# Patient Record
Sex: Female | Born: 1945
Health system: Southern US, Community
[De-identification: ages and names within clinical notes are randomized; demographics above are authoritative.]

## PROBLEM LIST (undated history)

## (undated) DIAGNOSIS — M1990 Unspecified osteoarthritis, unspecified site: Secondary | ICD-10-CM

## (undated) DIAGNOSIS — C801 Malignant (primary) neoplasm, unspecified: Secondary | ICD-10-CM

## (undated) DIAGNOSIS — E039 Hypothyroidism, unspecified: Secondary | ICD-10-CM

## (undated) DIAGNOSIS — G4730 Sleep apnea, unspecified: Secondary | ICD-10-CM

## (undated) DIAGNOSIS — I2699 Other pulmonary embolism without acute cor pulmonale: Secondary | ICD-10-CM

## (undated) DIAGNOSIS — R0600 Dyspnea, unspecified: Secondary | ICD-10-CM

## (undated) DIAGNOSIS — I10 Essential (primary) hypertension: Secondary | ICD-10-CM

## (undated) DIAGNOSIS — Z889 Allergy status to unspecified drugs, medicaments and biological substances status: Secondary | ICD-10-CM

## (undated) HISTORY — PX: BREAST SURGERY: SHX581

## (undated) HISTORY — PX: TONSILLECTOMY: SUR1361

---

## 1998-03-29 ENCOUNTER — Ambulatory Visit (HOSPITAL_COMMUNITY): Admission: RE | Admit: 1998-03-29 | Discharge: 1998-03-29 | Payer: Self-pay | Admitting: Family Medicine

## 2003-12-28 ENCOUNTER — Ambulatory Visit: Admission: RE | Admit: 2003-12-28 | Discharge: 2004-02-28 | Payer: Self-pay | Admitting: Radiation Oncology

## 2004-01-19 ENCOUNTER — Encounter: Admission: RE | Admit: 2004-01-19 | Discharge: 2004-01-19 | Payer: Self-pay | Admitting: Surgery

## 2004-01-20 ENCOUNTER — Encounter (INDEPENDENT_AMBULATORY_CARE_PROVIDER_SITE_OTHER): Payer: Self-pay | Admitting: *Deleted

## 2004-01-20 ENCOUNTER — Ambulatory Visit (HOSPITAL_BASED_OUTPATIENT_CLINIC_OR_DEPARTMENT_OTHER): Admission: RE | Admit: 2004-01-20 | Discharge: 2004-01-20 | Payer: Self-pay | Admitting: Surgery

## 2004-01-20 ENCOUNTER — Ambulatory Visit (HOSPITAL_COMMUNITY): Admission: RE | Admit: 2004-01-20 | Discharge: 2004-01-20 | Payer: Self-pay | Admitting: Surgery

## 2004-03-09 ENCOUNTER — Ambulatory Visit: Admission: RE | Admit: 2004-03-09 | Discharge: 2004-03-09 | Payer: Self-pay | Admitting: Oncology

## 2004-03-09 ENCOUNTER — Encounter: Payer: Self-pay | Admitting: Cardiology

## 2004-03-10 ENCOUNTER — Encounter: Admission: RE | Admit: 2004-03-10 | Discharge: 2004-03-10 | Payer: Self-pay | Admitting: Oncology

## 2004-03-13 ENCOUNTER — Ambulatory Visit (HOSPITAL_BASED_OUTPATIENT_CLINIC_OR_DEPARTMENT_OTHER): Admission: RE | Admit: 2004-03-13 | Discharge: 2004-03-13 | Payer: Self-pay | Admitting: Surgery

## 2004-03-13 ENCOUNTER — Ambulatory Visit (HOSPITAL_COMMUNITY): Admission: RE | Admit: 2004-03-13 | Discharge: 2004-03-13 | Payer: Self-pay | Admitting: Surgery

## 2004-03-21 ENCOUNTER — Ambulatory Visit (HOSPITAL_COMMUNITY): Admission: RE | Admit: 2004-03-21 | Discharge: 2004-03-21 | Payer: Self-pay | Admitting: Surgery

## 2004-07-01 IMAGING — CT CT CHEST W/ CM
1 of 2 series · 15 of 32 positions shown, 19 images · IV contrast (omnipaque)
Comparison: None.
COMPARISON: None.

CLINICAL DATA: Breast cancer.  
 CT CHEST WITH CONTRAST, CT HEAD WITH AND WITHOUT CONTRAST 03/09/04 AT 3933 HOURS
TECHNIQUE: CT head without contrast was performed.  Subsequently after the intravenous injection of 100 cc Omnipaque 300, 5 mm spiral images were obtained through the chest.  
 CT CHEST

[Series 2: chest routine 5.0 b10f · axial · 0.62mm/px · z∈[+6,+276]mm · 15 of 64 slices shown, 19 images]
[im 5/64  mediastinal]
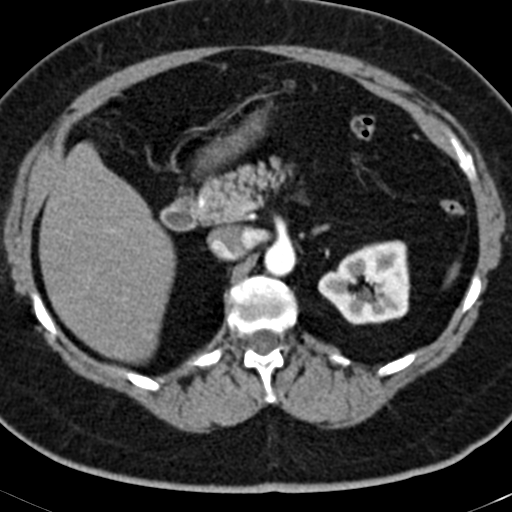
[im 5/64  lung]
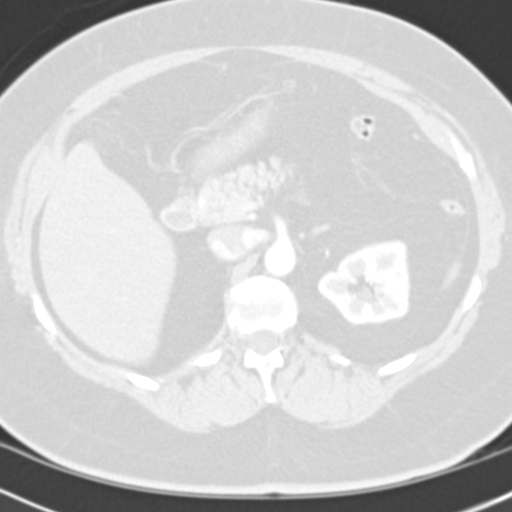
[im 9/64  lung]
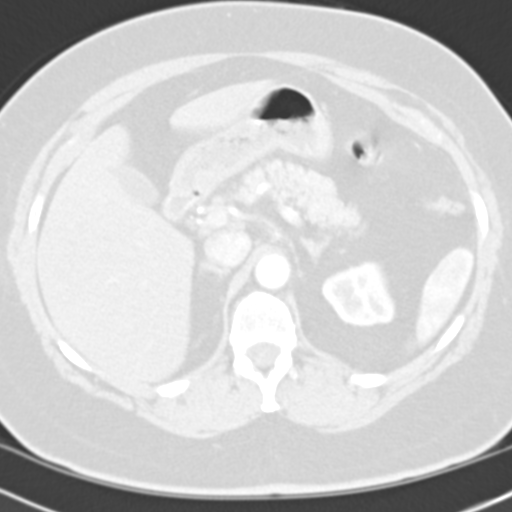
[im 13/64  lung]
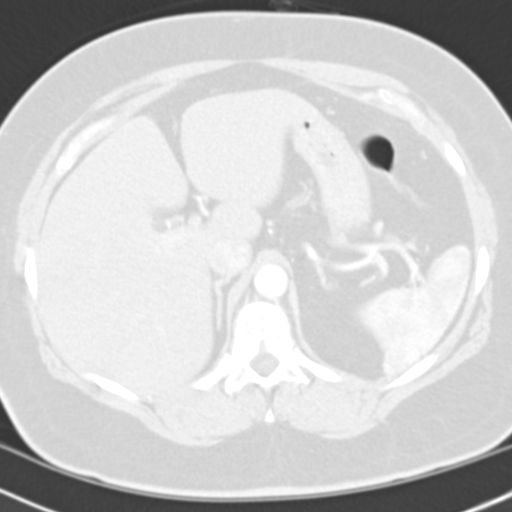
[im 17/64  lung]
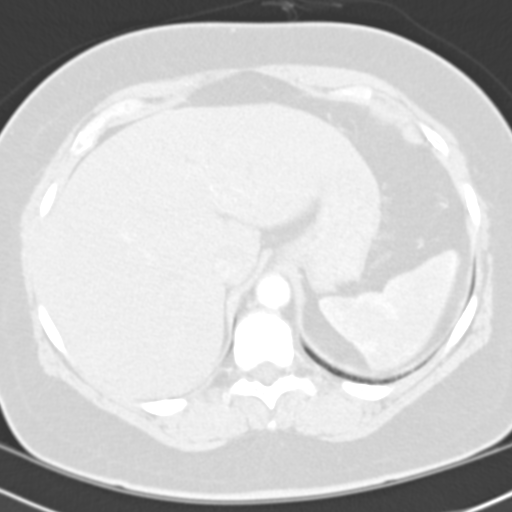
[im 22/64  mediastinal]
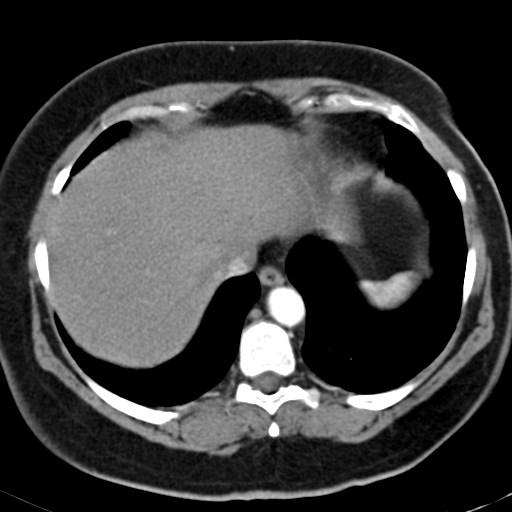
[im 22/64  lung]
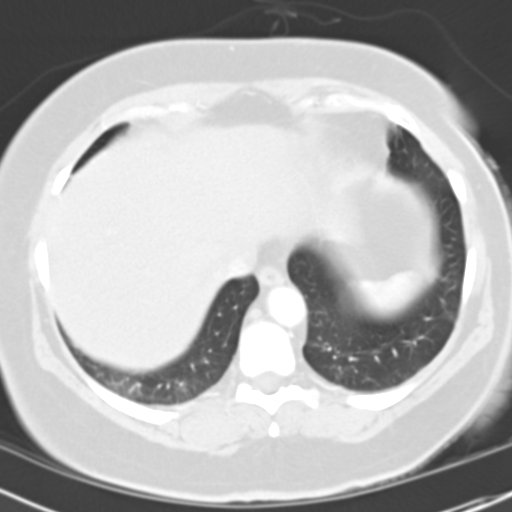
[im 26/64  lung]
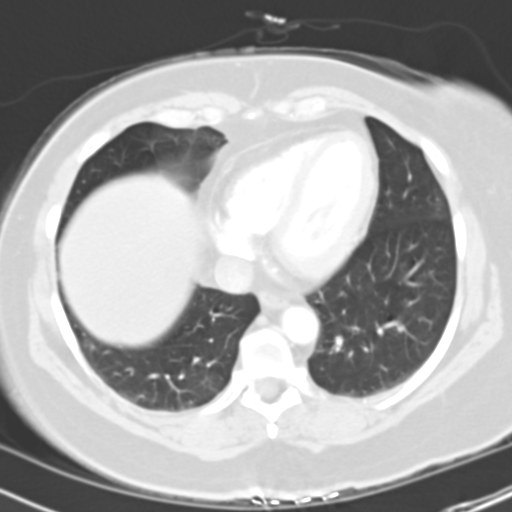
[im 30/64  lung]
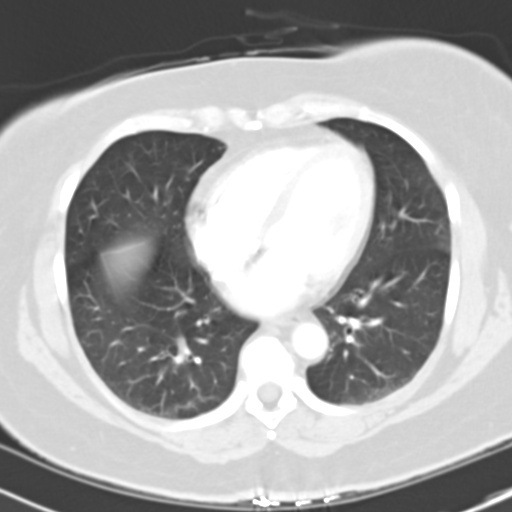
[im 32/64  lung]
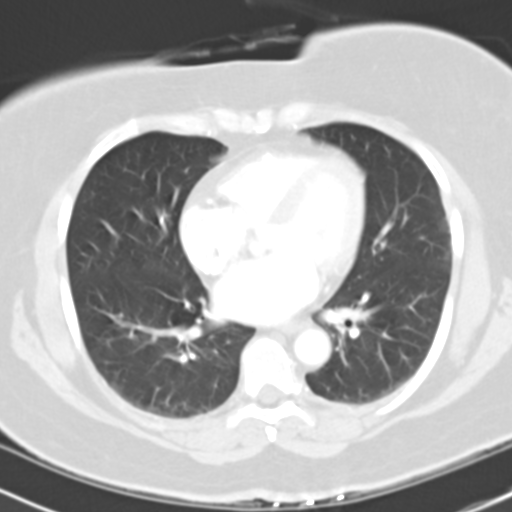
[im 34/64  mediastinal]
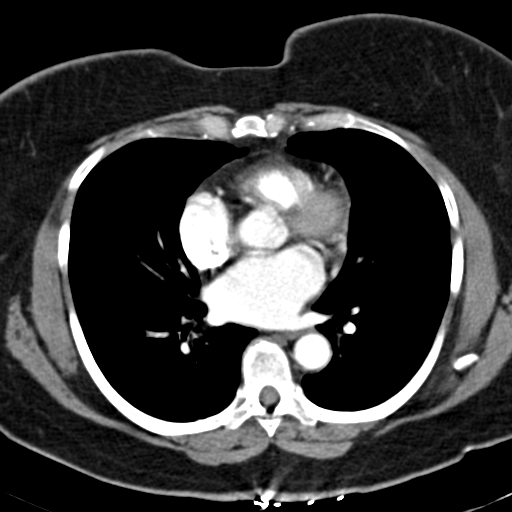
[im 34/64  lung]
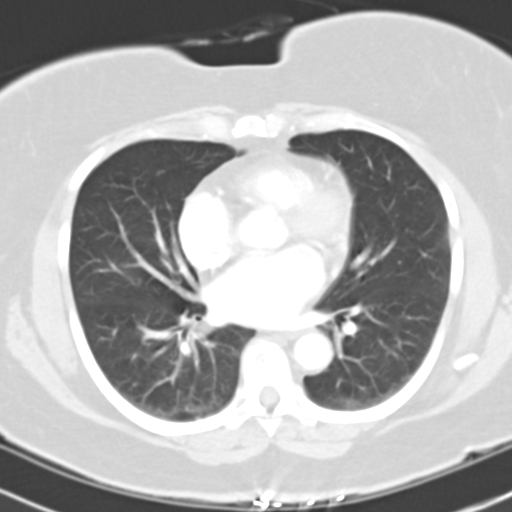
[im 38/64  lung]
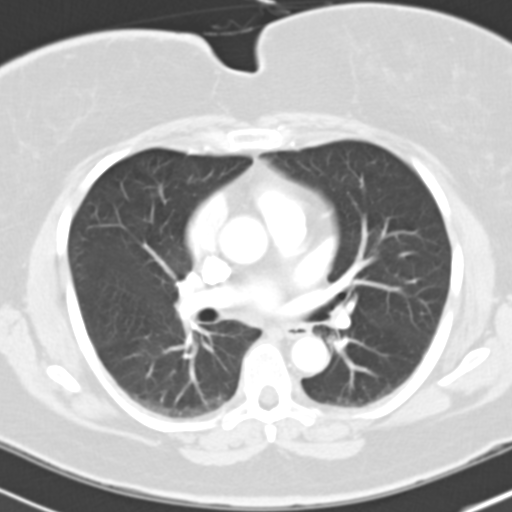
[im 43/64  lung]
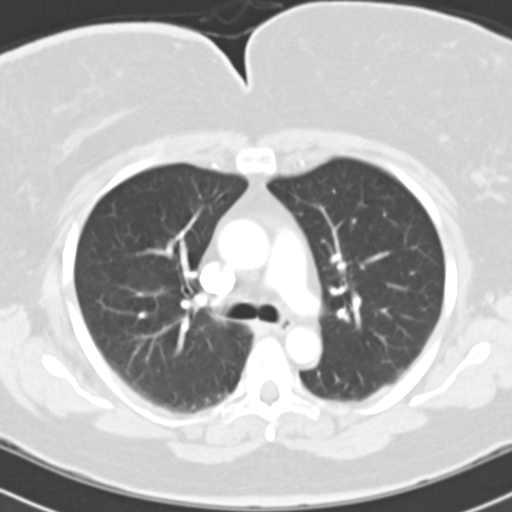
[im 47/64  lung]
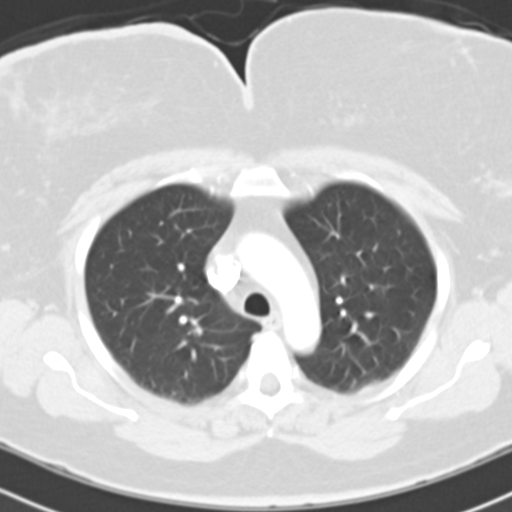
[im 51/64  mediastinal]
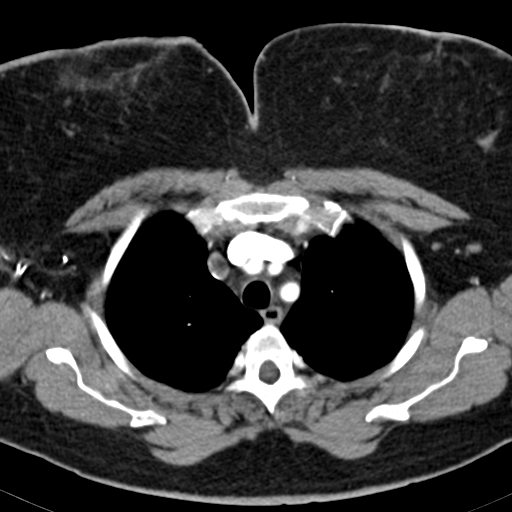
[im 51/64  lung]
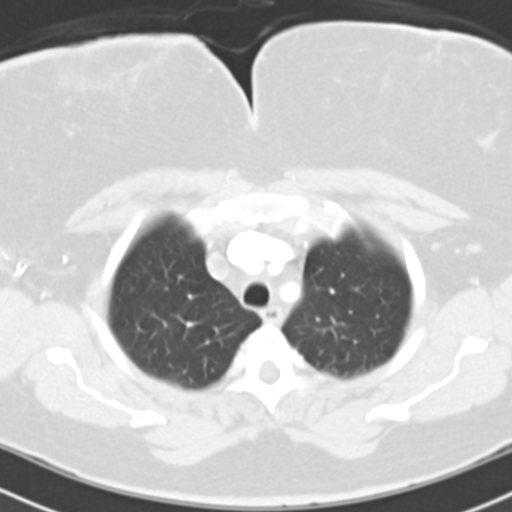
[im 55/64  lung]
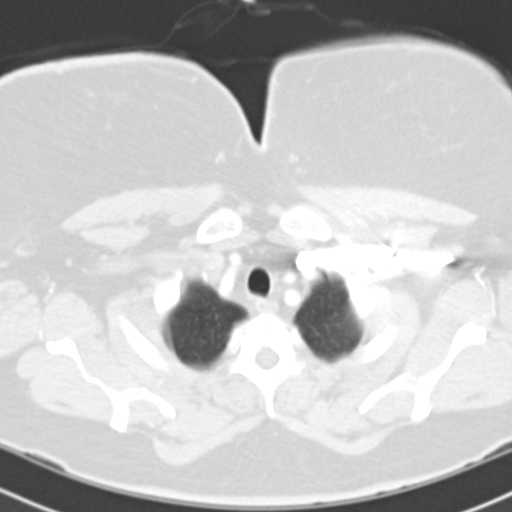
[im 59/64  lung]
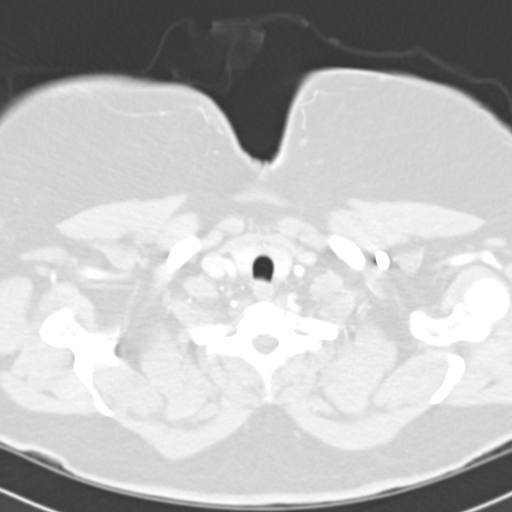

[15 of 32 positions shown; findings below may reference images not displayed]

FINDINGS: Postsurgical changes are noted in the right breast.  There is no evidence of abnormal mediastinal adenopathy.  
 No pneumothoraces or effusions are seen.  
 A 5 mm nodule is seen in the right middle lobe on image 29.  
 IMPRESSION
 Postsurgical changes in the right breast. 
 5 mm right middle lobe nodule.  
 CT HEAD WITH AND WITHOUT CONTRAST
FINDINGS: The brain parenchyma, ventricular system and extraaxial space are within normal limits.  There is no evidence of mass effect, midline shift, acute hemorrhage or abnormal enhancement.  Mucosal thickening is seen in the sphenoid and ethmoid sinuses.  The mastoid air cells are clear.  The cranium is intact.  
 IMPRESSION
 No evidence of metastatic disease.  Mucosal thickening in the paranasal sinuses is noted.

## 2004-07-24 ENCOUNTER — Ambulatory Visit: Payer: Self-pay | Admitting: Oncology

## 2004-09-07 ENCOUNTER — Ambulatory Visit (HOSPITAL_BASED_OUTPATIENT_CLINIC_OR_DEPARTMENT_OTHER): Admission: RE | Admit: 2004-09-07 | Discharge: 2004-09-07 | Payer: Self-pay | Admitting: Surgery

## 2004-09-29 ENCOUNTER — Ambulatory Visit: Admission: RE | Admit: 2004-09-29 | Discharge: 2004-12-20 | Payer: Self-pay | Admitting: Radiation Oncology

## 2004-10-13 ENCOUNTER — Ambulatory Visit (HOSPITAL_COMMUNITY): Admission: RE | Admit: 2004-10-13 | Discharge: 2004-10-13 | Payer: Self-pay | Admitting: Radiation Oncology

## 2004-11-14 ENCOUNTER — Ambulatory Visit: Payer: Self-pay | Admitting: Oncology

## 2005-01-11 ENCOUNTER — Ambulatory Visit: Admission: RE | Admit: 2005-01-11 | Discharge: 2005-01-11 | Payer: Self-pay | Admitting: Radiation Oncology

## 2005-02-05 ENCOUNTER — Ambulatory Visit: Payer: Self-pay | Admitting: Oncology

## 2005-05-07 ENCOUNTER — Ambulatory Visit: Payer: Self-pay | Admitting: Oncology

## 2005-08-29 ENCOUNTER — Other Ambulatory Visit: Admission: RE | Admit: 2005-08-29 | Discharge: 2005-08-29 | Payer: Self-pay | Admitting: Family Medicine

## 2005-08-31 ENCOUNTER — Ambulatory Visit: Payer: Self-pay | Admitting: Oncology

## 2005-10-30 ENCOUNTER — Ambulatory Visit (HOSPITAL_COMMUNITY): Admission: RE | Admit: 2005-10-30 | Discharge: 2005-10-30 | Payer: Self-pay | Admitting: Oncology

## 2005-11-02 ENCOUNTER — Ambulatory Visit: Payer: Self-pay | Admitting: Oncology

## 2005-12-25 ENCOUNTER — Ambulatory Visit: Payer: Self-pay | Admitting: Oncology

## 2005-12-26 LAB — CBC WITH DIFFERENTIAL/PLATELET
BASO%: 0.7 % (ref 0.0–2.0)
EOS%: 1.3 % (ref 0.0–7.0)
LYMPH%: 40.3 % (ref 14.0–48.0)
MCHC: 34.2 g/dL (ref 32.0–36.0)
MCV: 89.6 fL (ref 81.0–101.0)
MONO%: 8.4 % (ref 0.0–13.0)
Platelets: 387 10*3/uL (ref 145–400)
RBC: 4.21 10*6/uL (ref 3.70–5.32)

## 2005-12-26 LAB — COMPREHENSIVE METABOLIC PANEL
ALT: 20 U/L (ref 0–40)
AST: 20 U/L (ref 0–37)
Alkaline Phosphatase: 99 U/L (ref 39–117)
Glucose, Bld: 91 mg/dL (ref 70–99)
Sodium: 139 mEq/L (ref 135–145)
Total Bilirubin: 0.5 mg/dL (ref 0.3–1.2)
Total Protein: 7.1 g/dL (ref 6.0–8.3)

## 2005-12-28 ENCOUNTER — Ambulatory Visit (HOSPITAL_COMMUNITY): Admission: RE | Admit: 2005-12-28 | Discharge: 2005-12-28 | Payer: Self-pay | Admitting: Oncology

## 2006-06-18 ENCOUNTER — Ambulatory Visit: Payer: Self-pay | Admitting: Oncology

## 2006-06-20 LAB — COMPREHENSIVE METABOLIC PANEL
AST: 18 U/L (ref 0–37)
Albumin: 4.3 g/dL (ref 3.5–5.2)
Alkaline Phosphatase: 95 U/L (ref 39–117)
Chloride: 105 mEq/L (ref 96–112)
Glucose, Bld: 93 mg/dL (ref 70–99)
Potassium: 4 mEq/L (ref 3.5–5.3)
Sodium: 141 mEq/L (ref 135–145)
Total Protein: 7 g/dL (ref 6.0–8.3)

## 2006-06-20 LAB — CBC WITH DIFFERENTIAL/PLATELET
BASO%: 0.5 % (ref 0.0–2.0)
Eosinophils Absolute: 0.1 10*3/uL (ref 0.0–0.5)
LYMPH%: 45.9 % (ref 14.0–48.0)
MCHC: 34.5 g/dL (ref 32.0–36.0)
MONO#: 0.6 10*3/uL (ref 0.1–0.9)
NEUT#: 3.2 10*3/uL (ref 1.5–6.5)
Platelets: 372 10*3/uL (ref 145–400)
RBC: 4.22 10*6/uL (ref 3.70–5.32)
RDW: 14.9 % — ABNORMAL HIGH (ref 11.3–14.5)
WBC: 7.3 10*3/uL (ref 3.9–10.0)
lymph#: 3.4 10*3/uL — ABNORMAL HIGH (ref 0.9–3.3)

## 2006-12-16 ENCOUNTER — Ambulatory Visit: Payer: Self-pay | Admitting: Oncology

## 2006-12-18 LAB — CBC WITH DIFFERENTIAL/PLATELET
BASO%: 0.5 % (ref 0.0–2.0)
Eosinophils Absolute: 0.1 10*3/uL (ref 0.0–0.5)
MCHC: 35.3 g/dL (ref 32.0–36.0)
MONO#: 0.7 10*3/uL (ref 0.1–0.9)
NEUT#: 3.6 10*3/uL (ref 1.5–6.5)
Platelets: 386 10*3/uL (ref 145–400)
RBC: 4.31 10*6/uL (ref 3.70–5.32)
WBC: 7.9 10*3/uL (ref 3.9–10.0)
lymph#: 3.5 10*3/uL — ABNORMAL HIGH (ref 0.9–3.3)

## 2006-12-18 LAB — COMPREHENSIVE METABOLIC PANEL
ALT: 19 U/L (ref 0–35)
Albumin: 4.5 g/dL (ref 3.5–5.2)
CO2: 25 mEq/L (ref 19–32)
Calcium: 10.2 mg/dL (ref 8.4–10.5)
Chloride: 106 mEq/L (ref 96–112)
Glucose, Bld: 87 mg/dL (ref 70–99)
Potassium: 4 mEq/L (ref 3.5–5.3)
Sodium: 141 mEq/L (ref 135–145)
Total Bilirubin: 0.5 mg/dL (ref 0.3–1.2)
Total Protein: 7.4 g/dL (ref 6.0–8.3)

## 2006-12-18 LAB — CANCER ANTIGEN 27.29: CA 27.29: 18 U/mL (ref 0–39)

## 2007-06-20 ENCOUNTER — Ambulatory Visit: Payer: Self-pay | Admitting: Oncology

## 2007-06-25 LAB — COMPREHENSIVE METABOLIC PANEL
ALT: 18 U/L (ref 0–35)
AST: 21 U/L (ref 0–37)
Calcium: 9.9 mg/dL (ref 8.4–10.5)
Chloride: 105 mEq/L (ref 96–112)
Creatinine, Ser: 0.83 mg/dL (ref 0.40–1.20)
Sodium: 142 mEq/L (ref 135–145)
Total Bilirubin: 0.4 mg/dL (ref 0.3–1.2)
Total Protein: 7.1 g/dL (ref 6.0–8.3)

## 2007-06-25 LAB — CBC WITH DIFFERENTIAL/PLATELET
BASO%: 0.9 % (ref 0.0–2.0)
EOS%: 1 % (ref 0.0–7.0)
HCT: 38.4 % (ref 34.8–46.6)
MCH: 31.4 pg (ref 26.0–34.0)
MCHC: 35.1 g/dL (ref 32.0–36.0)
NEUT%: 48.9 % (ref 39.6–76.8)
RBC: 4.3 10*6/uL (ref 3.70–5.32)
WBC: 7.9 10*3/uL (ref 3.9–10.0)
lymph#: 3.3 10*3/uL (ref 0.9–3.3)

## 2007-09-09 ENCOUNTER — Other Ambulatory Visit: Admission: RE | Admit: 2007-09-09 | Discharge: 2007-09-09 | Payer: Self-pay | Admitting: Family Medicine

## 2007-12-22 ENCOUNTER — Ambulatory Visit: Payer: Self-pay | Admitting: Oncology

## 2007-12-24 LAB — COMPREHENSIVE METABOLIC PANEL
AST: 22 U/L (ref 0–37)
Albumin: 4.4 g/dL (ref 3.5–5.2)
BUN: 16 mg/dL (ref 6–23)
Calcium: 9.7 mg/dL (ref 8.4–10.5)
Chloride: 106 mEq/L (ref 96–112)
Creatinine, Ser: 0.9 mg/dL (ref 0.40–1.20)
Glucose, Bld: 121 mg/dL — ABNORMAL HIGH (ref 70–99)
Potassium: 4.3 mEq/L (ref 3.5–5.3)

## 2007-12-24 LAB — CBC WITH DIFFERENTIAL/PLATELET
Basophils Absolute: 0 10*3/uL (ref 0.0–0.1)
EOS%: 2.7 % (ref 0.0–7.0)
Eosinophils Absolute: 0.2 10*3/uL (ref 0.0–0.5)
HCT: 36.5 % (ref 34.8–46.6)
HGB: 12.7 g/dL (ref 11.6–15.9)
MCH: 31.1 pg (ref 26.0–34.0)
MCV: 89.2 fL (ref 81.0–101.0)
MONO%: 6.6 % (ref 0.0–13.0)
NEUT#: 3.7 10*3/uL (ref 1.5–6.5)
NEUT%: 50.1 % (ref 39.6–76.8)
lymph#: 3 10*3/uL (ref 0.9–3.3)

## 2007-12-24 LAB — CANCER ANTIGEN 27.29: CA 27.29: 15 U/mL (ref 0–39)

## 2008-01-05 LAB — ESTRADIOL, ULTRA SENS: Estradiol, Ultra Sensitive: 2 pg/mL

## 2008-07-02 ENCOUNTER — Ambulatory Visit: Payer: Self-pay | Admitting: Oncology

## 2008-07-06 LAB — CBC WITH DIFFERENTIAL/PLATELET
Basophils Absolute: 0 10*3/uL (ref 0.0–0.1)
HCT: 38.2 % (ref 34.8–46.6)
HGB: 13.2 g/dL (ref 11.6–15.9)
MONO#: 0.6 10*3/uL (ref 0.1–0.9)
NEUT#: 3.4 10*3/uL (ref 1.5–6.5)
NEUT%: 45.4 % (ref 39.6–76.8)
RDW: 14.9 % — ABNORMAL HIGH (ref 11.3–14.5)
WBC: 7.6 10*3/uL (ref 3.9–10.0)
lymph#: 3.2 10*3/uL (ref 0.9–3.3)

## 2008-07-07 LAB — LIPID PANEL
Cholesterol: 190 mg/dL (ref 0–200)
HDL: 44 mg/dL (ref >39)
Total CHOL/HDL Ratio: 4.3 Ratio
Triglycerides: 312 mg/dL — ABNORMAL HIGH (ref <150)
VLDL: 62 mg/dL — ABNORMAL HIGH (ref 0–40)

## 2008-07-07 LAB — COMPREHENSIVE METABOLIC PANEL
AST: 26 U/L (ref 0–37)
Albumin: 4.6 g/dL (ref 3.5–5.2)
Alkaline Phosphatase: 82 U/L (ref 39–117)
BUN: 16 mg/dL (ref 6–23)
Creatinine, Ser: 0.83 mg/dL (ref 0.40–1.20)
Potassium: 4.3 mEq/L (ref 3.5–5.3)
Total Bilirubin: 0.6 mg/dL (ref 0.3–1.2)

## 2009-07-01 ENCOUNTER — Ambulatory Visit: Payer: Self-pay | Admitting: Oncology

## 2009-07-05 LAB — CBC WITH DIFFERENTIAL/PLATELET
Basophils Absolute: 0.1 10*3/uL (ref 0.0–0.1)
EOS%: 2.7 % (ref 0.0–7.0)
Eosinophils Absolute: 0.2 10*3/uL (ref 0.0–0.5)
HGB: 13 g/dL (ref 11.6–15.9)
MCH: 31.4 pg (ref 25.1–34.0)
NEUT#: 3.6 10*3/uL (ref 1.5–6.5)
RDW: 14.4 % (ref 11.2–14.5)
WBC: 8.5 10*3/uL (ref 3.9–10.3)
lymph#: 4 10*3/uL — ABNORMAL HIGH (ref 0.9–3.3)

## 2009-07-05 LAB — COMPREHENSIVE METABOLIC PANEL
AST: 26 U/L (ref 0–37)
Albumin: 4.2 g/dL (ref 3.5–5.2)
BUN: 19 mg/dL (ref 6–23)
Calcium: 9.3 mg/dL (ref 8.4–10.5)
Chloride: 104 mEq/L (ref 96–112)
Potassium: 3.6 mEq/L (ref 3.5–5.3)

## 2010-01-05 ENCOUNTER — Ambulatory Visit: Payer: Self-pay | Admitting: Oncology

## 2010-01-09 LAB — CBC WITH DIFFERENTIAL/PLATELET
BASO%: 0.6 % (ref 0.0–2.0)
Eosinophils Absolute: 0.2 10*3/uL (ref 0.0–0.5)
HCT: 38.4 % (ref 34.8–46.6)
MCHC: 33.8 g/dL (ref 31.5–36.0)
MONO#: 0.5 10*3/uL (ref 0.1–0.9)
NEUT#: 3.3 10*3/uL (ref 1.5–6.5)
NEUT%: 44.8 % (ref 38.4–76.8)
RBC: 4.16 10*6/uL (ref 3.70–5.45)
WBC: 7.3 10*3/uL (ref 3.9–10.3)
lymph#: 3.3 10*3/uL (ref 0.9–3.3)

## 2010-01-09 LAB — COMPREHENSIVE METABOLIC PANEL
ALT: 24 U/L (ref 0–35)
Albumin: 4.1 g/dL (ref 3.5–5.2)
CO2: 27 mEq/L (ref 19–32)
Calcium: 10 mg/dL (ref 8.4–10.5)
Chloride: 105 mEq/L (ref 96–112)
Sodium: 140 mEq/L (ref 135–145)
Total Protein: 7.1 g/dL (ref 6.0–8.3)

## 2010-01-09 LAB — CANCER ANTIGEN 27.29: CA 27.29: 17 U/mL (ref 0–39)

## 2010-01-09 LAB — LIPID PANEL
LDL Cholesterol: 100 mg/dL — ABNORMAL HIGH (ref 0–99)
VLDL: 41 mg/dL — ABNORMAL HIGH (ref 0–40)

## 2010-10-15 ENCOUNTER — Encounter: Payer: Self-pay | Admitting: Oncology

## 2011-02-09 NOTE — Op Note (Signed)
NAMEJOHNATHON, MITTAL              ACCOUNT NO.:  [id number]   MEDICAL RECORD NO.:  [id number]          PATIENT TYPE:  AMB   LOCATION:  [facility]                          FACILITY:  [facility]   PHYSICIAN:  Currie Paris, M.D.DATE OF BIRTH:  August 02, 1946   DATE OF PROCEDURE:  09/07/2004  DATE OF DISCHARGE:                                 OPERATIVE REPORT   PREOPERATIVE DIAGNOSIS:  Unneeded Port-A-Cath.   POSTOPERATIVE DIAGNOSIS:  Unneeded Port-A-Cath.   OPERATION:  Removal of Port-A-Cath.   SURGEON:  Currie Paris, M.D.   ANESTHESIA:  Local.   CLINICAL HISTORY:  This patient has completed her chemotherapy and is  getting ready to start radiation and wished to have her Port-A-Cath removed.   DESCRIPTION OF PROCEDURE:  In the minor procedure room, the Port-A-Cath area  was identified, prepped with alcohol, and anesthetized with about 10 mL of  1% Xylocaine with epinephrine.  After about a 10-minute wait, the area was  prepped with Betadine.  The old scar was opened and a Port-A-Cath readily  identified.  The two holding Prolene sutures were cut and the Port-A-Cath  removed from the pocket and the tubing backed up a little ways.   I put a 3-0 Vicryl figure-of-eight through around the Port-A-Cath tubing  site so that the tract would be occluded when the catheter backed out and  then removed the catheter intact.  The suture was tied down.  There was no  back bleeding.   The incision was closed with some 3-0 Vicryl and 4-0 Monocryl subcuticular  and Dermabond.  The patient tolerated the procedure well with no  complications noted.      Chri   CJS/MEDQ  D:  09/07/2004  T:  09/07/2004  Job:  [id number]

## 2011-03-01 ENCOUNTER — Other Ambulatory Visit: Payer: Self-pay | Admitting: Family Medicine

## 2011-03-01 ENCOUNTER — Other Ambulatory Visit (HOSPITAL_COMMUNITY)
Admission: RE | Admit: 2011-03-01 | Discharge: 2011-03-01 | Disposition: A | Discharge status: Home / Self Care | Payer: Self-pay | Source: Ambulatory Visit | Attending: Family Medicine | Admitting: Family Medicine

## 2011-03-01 DIAGNOSIS — Z01419 Encounter for gynecological examination (general) (routine) without abnormal findings: Secondary | ICD-10-CM | POA: Insufficient documentation

## 2013-05-28 ENCOUNTER — Encounter: Payer: Self-pay | Admitting: Oncology

## 2013-12-17 ENCOUNTER — Other Ambulatory Visit: Payer: Self-pay | Admitting: Family Medicine

## 2013-12-17 ENCOUNTER — Other Ambulatory Visit (HOSPITAL_COMMUNITY)
Admission: RE | Admit: 2013-12-17 | Discharge: 2013-12-17 | Disposition: A | Discharge status: Home / Self Care | Payer: Medicare PPO | Source: Ambulatory Visit | Attending: Family Medicine | Admitting: Family Medicine

## 2013-12-17 DIAGNOSIS — Z124 Encounter for screening for malignant neoplasm of cervix: Secondary | ICD-10-CM | POA: Insufficient documentation

## 2014-02-16 ENCOUNTER — Other Ambulatory Visit: Payer: Self-pay | Admitting: Gastroenterology

## 2014-12-16 DIAGNOSIS — E6609 Other obesity due to excess calories: Secondary | ICD-10-CM | POA: Diagnosis not present

## 2014-12-16 DIAGNOSIS — Z6839 Body mass index (BMI) 39.0-39.9, adult: Secondary | ICD-10-CM | POA: Diagnosis not present

## 2014-12-16 DIAGNOSIS — E785 Hyperlipidemia, unspecified: Secondary | ICD-10-CM | POA: Diagnosis not present

## 2014-12-16 DIAGNOSIS — I10 Essential (primary) hypertension: Secondary | ICD-10-CM | POA: Diagnosis not present

## 2014-12-16 DIAGNOSIS — E039 Hypothyroidism, unspecified: Secondary | ICD-10-CM | POA: Diagnosis not present

## 2015-05-10 DIAGNOSIS — Z853 Personal history of malignant neoplasm of breast: Secondary | ICD-10-CM | POA: Diagnosis not present

## 2015-05-10 DIAGNOSIS — Z1231 Encounter for screening mammogram for malignant neoplasm of breast: Secondary | ICD-10-CM | POA: Diagnosis not present

## 2015-06-02 DIAGNOSIS — Z23 Encounter for immunization: Secondary | ICD-10-CM | POA: Diagnosis not present

## 2015-06-02 DIAGNOSIS — E039 Hypothyroidism, unspecified: Secondary | ICD-10-CM | POA: Diagnosis not present

## 2015-06-02 DIAGNOSIS — I10 Essential (primary) hypertension: Secondary | ICD-10-CM | POA: Diagnosis not present

## 2015-06-02 DIAGNOSIS — E785 Hyperlipidemia, unspecified: Secondary | ICD-10-CM | POA: Diagnosis not present

## 2015-06-02 DIAGNOSIS — Z0000 Encounter for general adult medical examination without abnormal findings: Secondary | ICD-10-CM | POA: Diagnosis not present

## 2015-06-02 DIAGNOSIS — M8580 Other specified disorders of bone density and structure, unspecified site: Secondary | ICD-10-CM | POA: Diagnosis not present

## 2015-09-27 DIAGNOSIS — I10 Essential (primary) hypertension: Secondary | ICD-10-CM | POA: Diagnosis not present

## 2015-09-27 DIAGNOSIS — N898 Other specified noninflammatory disorders of vagina: Secondary | ICD-10-CM | POA: Diagnosis not present

## 2015-09-29 ENCOUNTER — Other Ambulatory Visit: Payer: Self-pay | Admitting: Family Medicine

## 2015-09-29 DIAGNOSIS — N898 Other specified noninflammatory disorders of vagina: Secondary | ICD-10-CM

## 2015-10-03 ENCOUNTER — Inpatient Hospital Stay
Admission: RE | Admit: 2015-10-03 | Discharge: 2015-10-03 | Disposition: A | Discharge status: Home / Self Care | Payer: Medicare PPO | Source: Ambulatory Visit | Attending: Family Medicine | Admitting: Family Medicine

## 2015-10-06 ENCOUNTER — Ambulatory Visit
Admission: RE | Admit: 2015-10-06 | Discharge: 2015-10-06 | Disposition: A | Discharge status: Home / Self Care | Payer: Medicare PPO | Source: Ambulatory Visit | Attending: Family Medicine | Admitting: Family Medicine

## 2015-10-06 DIAGNOSIS — N898 Other specified noninflammatory disorders of vagina: Secondary | ICD-10-CM | POA: Diagnosis not present

## 2015-10-06 MED ORDER — IOPAMIDOL (ISOVUE-300) INJECTION 61%
100.0000 mL | Freq: Once | INTRAVENOUS | Status: AC | PRN
  Administered 2015-10-06: 100 mL via INTRAVENOUS

## 2015-10-17 ENCOUNTER — Other Ambulatory Visit: Payer: Self-pay | Admitting: General Surgery

## 2015-10-17 DIAGNOSIS — N824 Other female intestinal-genital tract fistulae: Secondary | ICD-10-CM | POA: Diagnosis not present

## 2015-10-17 NOTE — H&P (Signed)
History of Present Illness Cassandra Ruff MD; [id number] 12:11 PM) Patient words: New-.  The patient is a 70 year old female who presents with diverticulitis. This 70 year old female who presents to the office with vaginal drainage since November. She states this occurs after bowel movements. She underwent a CT scan which showed diverticulitis in the area near her vagina on the left side. She denies any surgical history. Her last colonoscopy was less than 2 years ago and normal per patient's recollection. She denies any rectal bleeding. She denies any pain.   Other Problems Malachi Bonds, CMA; 10/17/2015 8:47 AM) Breast Cancer High blood pressure Hypercholesterolemia Thyroid Disease  Past Surgical History Malachi Bonds, CMA; 10/17/2015 8:47 AM) Breast Biopsy Right. Breast Mass; Local Excision Right. Sentinel Lymph Node Biopsy Tonsillectomy  Diagnostic Studies History Malachi Bonds, CMA; 10/17/2015 8:47 AM) Colonoscopy within last year Mammogram within last year Pap Smear 1-5 years ago  Allergies Malachi Bonds, CMA; 10/17/2015 8:48 AM) No Known Drug Allergies 10/17/2015  Medication History Malachi Bonds, CMA; 10/17/2015 8:49 AM) MetroNIDAZOLE (500MG  Tablet, Oral) Active. Sulfamethoxazole-Trimethoprim (800-160MG  Tablet, Oral) Active. Levothyroxine Sodium (100MCG Tablet, Oral) Active. Atorvastatin Calcium (80MG  Tablet, Oral) Active. Bisoprolol-Hydrochlorothiazide (10-6.25MG  Tablet, Oral) Active. Benazepril HCl (20MG  Tablet, Oral) Active. Medications Reconciled  Social History Malachi Bonds, CMA; 10/17/2015 8:47 AM) Alcohol use Occasional alcohol use. Caffeine use Carbonated beverages, Coffee, Tea. No drug use Tobacco use Never smoker.  Family History Malachi Bonds, CMA; 10/17/2015 8:47 AM) Arthritis Mother. Cerebrovascular Accident Brother. Heart Disease Brother. Hypertension Mother. Migraine Headache Father. Prostate Cancer  Father. Respiratory Condition Father, Mother.  Pregnancy / Birth History Malachi Bonds, CMA; 10/17/2015 8:47 AM) Age at menarche 60 years. Age of menopause 54-60 Gravida 0 Para 0 Regular periods     Review of Systems Malachi Bonds CMA; 10/17/2015 8:47 AM) General Not Present- Appetite Loss, Chills, Fatigue, Fever, Night Sweats, Weight Gain and Weight Loss. Skin Not Present- Change in Wart/Mole, Dryness, Hives, Jaundice, New Lesions, Non-Healing Wounds, Rash and Ulcer. HEENT Not Present- Earache, Hearing Loss, Hoarseness, Nose Bleed, Oral Ulcers, Ringing in the Ears, Seasonal Allergies, Sinus Pain, Sore Throat, Visual Disturbances, Wears glasses/contact lenses and Yellow Eyes. Respiratory Not Present- Bloody sputum, Chronic Cough, Difficulty Breathing, Snoring and Wheezing. Breast Not Present- Breast Mass, Breast Pain, Nipple Discharge and Skin Changes. Cardiovascular Not Present- Chest Pain, Difficulty Breathing Lying Down, Leg Cramps, Palpitations, Rapid Heart Rate, Shortness of Breath and Swelling of Extremities. Gastrointestinal Present- Change in Bowel Habits. Not Present- Abdominal Pain, Bloating, Bloody Stool, Chronic diarrhea, Constipation, Difficulty Swallowing, Excessive gas, Gets full quickly at meals, Hemorrhoids, Indigestion, Nausea, Rectal Pain and Vomiting. Female Genitourinary Present- Frequency. Not Present- Nocturia, Painful Urination, Pelvic Pain and Urgency. Musculoskeletal Present- Joint Pain. Not Present- Back Pain, Joint Stiffness, Muscle Pain, Muscle Weakness and Swelling of Extremities. Neurological Not Present- Decreased Memory, Fainting, Headaches, Numbness, Seizures, Tingling, Tremor, Trouble walking and Weakness. Psychiatric Not Present- Anxiety, Bipolar, Change in Sleep Pattern, Depression, Fearful and Frequent crying. Endocrine Not Present- Cold Intolerance, Excessive Hunger, Hair Changes, Heat Intolerance, Hot flashes and New Diabetes. Hematology Not  Present- Easy Bruising, Excessive bleeding, Gland problems, HIV and Persistent Infections.  Vitals (Chemira Jones CMA; 10/17/2015 8:48 AM) 10/17/2015 8:48 AM Weight: 179.8 lb Height: 60in Body Surface Area: 1.78 m Body Mass Index: 35.11 kg/m  Temp.: 98.25F(Oral)  Pulse: 74 (Regular)  BP: 130/84 (Sitting, Left Arm, Standard)      Physical Exam Cassandra Ruff MD; [id number] 12:12 PM)  General Mental Status-Alert. General Appearance-Not in acute distress. Build &  Nutrition-Well nourished. Posture-Normal posture. Gait-Normal.  Head and Neck Head-normocephalic, atraumatic with no lesions or palpable masses. Trachea-midline.  Chest and Lung Exam Chest and lung exam reveals -on auscultation, normal breath sounds, no adventitious sounds and normal vocal resonance.  Cardiovascular Cardiovascular examination reveals -normal heart sounds, regular rate and rhythm with no murmurs.  Abdomen Inspection Inspection of the abdomen reveals - Note: Umbilical hernia present, approximately 2 cm fascial defect. Palpation/Percussion Palpation and Percussion of the abdomen reveal - Soft, Non Tender, No Rigidity (guarding), No hepatosplenomegaly and No Palpable abdominal masses.  Neurologic Neurologic evaluation reveals -alert and oriented x 3 with no impairment of recent or remote memory, normal attention span and ability to concentrate, normal sensation and normal coordination.  Musculoskeletal Normal Exam - Bilateral-Upper Extremity Strength Normal and Lower Extremity Strength Normal.    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 We will check with [facility] and confirm she has had a recent colonoscopy. After this, I believe she can be scheduled for a minimally invasive sigmoidectmy. We will close her umbilical hernia as well. The surgery  and anatomy were described to the patient as well as the risks of surgery and the possible complications. These include: Bleeding, deep abdominal infections and possible wound complications such as hernia and infection, damage to adjacent structures, leak of surgical connections, which can lead to other surgeries and possibly an ostomy, possible need for other procedures, such as abscess drains in radiology, possible prolonged hospital stay, possible diarrhea from removal of part of the colon, possible constipation from narcotics, possible bowel, bladder or sexual dysfunction if having rectal surgery, prolonged fatigue/weakness or appetite loss, possible early recurrence of of disease, possible complications of their medical problems such as heart disease or arrhythmias or lung problems, death (less than 1%). I believe the patient understands and wishes to proceed with the surgery.

## 2015-10-26 HISTORY — PX: COLON SURGERY: SHX602

## 2015-11-15 ENCOUNTER — Encounter (HOSPITAL_COMMUNITY): Payer: Self-pay

## 2015-11-15 ENCOUNTER — Encounter (HOSPITAL_COMMUNITY)
Admission: RE | Admit: 2015-11-15 | Discharge: 2015-11-15 | Disposition: A | Discharge status: Home / Self Care | Payer: Commercial Managed Care - HMO | Source: Ambulatory Visit | Attending: General Surgery | Admitting: General Surgery

## 2015-11-15 DIAGNOSIS — Z01812 Encounter for preprocedural laboratory examination: Secondary | ICD-10-CM | POA: Diagnosis not present

## 2015-11-15 DIAGNOSIS — D125 Benign neoplasm of sigmoid colon: Secondary | ICD-10-CM | POA: Diagnosis not present

## 2015-11-15 DIAGNOSIS — K5730 Diverticulosis of large intestine without perforation or abscess without bleeding: Secondary | ICD-10-CM | POA: Diagnosis present

## 2015-11-15 DIAGNOSIS — K5790 Diverticulosis of intestine, part unspecified, without perforation or abscess without bleeding: Secondary | ICD-10-CM | POA: Diagnosis not present

## 2015-11-15 DIAGNOSIS — K5720 Diverticulitis of large intestine with perforation and abscess without bleeding: Secondary | ICD-10-CM | POA: Diagnosis not present

## 2015-11-15 DIAGNOSIS — Z79899 Other long term (current) drug therapy: Secondary | ICD-10-CM | POA: Diagnosis not present

## 2015-11-15 DIAGNOSIS — K429 Umbilical hernia without obstruction or gangrene: Secondary | ICD-10-CM | POA: Diagnosis not present

## 2015-11-15 DIAGNOSIS — N823 Fistula of vagina to large intestine: Secondary | ICD-10-CM | POA: Diagnosis not present

## 2015-11-15 DIAGNOSIS — N824 Other female intestinal-genital tract fistulae: Secondary | ICD-10-CM | POA: Diagnosis not present

## 2015-11-15 HISTORY — DX: Essential (primary) hypertension: I10

## 2015-11-15 HISTORY — DX: Hypothyroidism, unspecified: E03.9

## 2015-11-15 HISTORY — DX: Malignant (primary) neoplasm, unspecified: C80.1

## 2015-11-15 HISTORY — DX: Allergy status to unspecified drugs, medicaments and biological substances: Z88.9

## 2015-11-15 LAB — BASIC METABOLIC PANEL
ANION GAP: 7 (ref 5–15)
BUN: 15 mg/dL (ref 6–20)
CO2: 29 mmol/L (ref 22–32)
Calcium: 10.1 mg/dL (ref 8.9–10.3)
Chloride: 104 mmol/L (ref 101–111)
Creatinine, Ser: 0.79 mg/dL (ref 0.44–1.00)
GFR calc Af Amer: >60 mL/min (ref >60)
GFR calc non Af Amer: >60 mL/min (ref >60)
GLUCOSE: 128 mg/dL — AB (ref 65–99)
POTASSIUM: 4.7 mmol/L (ref 3.5–5.1)
Sodium: 140 mmol/L (ref 135–145)

## 2015-11-15 LAB — CBC
HEMATOCRIT: 41 % (ref 36.0–46.0)
HEMOGLOBIN: 13.1 g/dL (ref 12.0–15.0)
MCH: 29.8 pg (ref 26.0–34.0)
MCHC: 32 g/dL (ref 30.0–36.0)
MCV: 93.4 fL (ref 78.0–100.0)
Platelets: 401 10*3/uL — ABNORMAL HIGH (ref 150–400)
RBC: 4.39 MIL/uL (ref 3.87–5.11)
RDW: 15.4 % (ref 11.5–15.5)
WBC: 8.3 10*3/uL (ref 4.0–10.5)

## 2015-11-15 LAB — ABO/RH: ABO/RH(D): A POS

## 2015-11-15 NOTE — Progress Notes (Signed)
Dr. Marcello Moores- should consent order include Umbilical hernia Repair. Please adjust orders if needed, thanks, Michaiah Holsopple,RN

## 2015-11-15 NOTE — Patient Instructions (Addendum)
ALMEE LOCKLIN  11/15/2015   Your procedure is scheduled on: 11-18-15  Report to [facility] Main  Entrance take [facility]  elevators to 3rd floor to  [place] at  0930 AM.  Call this number if you have problems the morning of surgery [phone]   Remember: ONLY 1 PERSON MAY GO WITH YOU TO SHORT STAY TO GET  READY MORNING OF [place].  Do not eat food or drink liquids :After Midnight.     Take these medicines the morning of surgery with A SIP OF WATER: Levothyroxine. Loratadine. Reminder(you will be given Bisoprolol On arrival in Short Stay). DO NOT TAKE ANY DIABETIC MEDICATIONS DAY OF YOUR SURGERY                               You may not have any metal on your body including hair pins and              piercings  Do not wear jewelry, make-up, lotions, powders or perfumes, deodorant             Do not wear nail polish.  Do not shave  48 hours prior to surgery.              Men may shave face and neck.   Do not bring valuables to the hospital. [place].  Contacts, dentures or bridgework may not be worn into surgery.  Leave suitcase in the car. After surgery it may be brought to your room.     Patients discharged the day of surgery will not be allowed to drive home.  Name and phone number of your driver: Egbert Garibaldi [phone]   Special Instructions: N/A              Please read over the following fact sheets you were given: _____________________________________________________________________             [facility] - Preparing for Surgery Before surgery, you can play an important role.  Because skin is not sterile, your skin needs to be as free of germs as possible.  You can reduce the number of germs on your skin by washing with CHG (chlorahexidine gluconate) soap before surgery.  CHG is an antiseptic cleaner which kills germs and bonds with the skin to continue killing germs even after  washing. Please DO NOT use if you have an allergy to CHG or antibacterial soaps.  If your skin becomes reddened/irritated stop using the CHG and inform your nurse when you arrive at Short Stay. Do not shave (including legs and underarms) for at least 48 hours prior to the first CHG shower.  You may shave your face/neck. Please follow these instructions carefully:  1.  Shower with CHG Soap the night before surgery and the  morning of Surgery.  2.  If you choose to wash your hair, wash your hair first as usual with your  normal  shampoo.  3.  After you shampoo, rinse your hair and body thoroughly to remove the  shampoo.                           4.  Use CHG as you would any other  liquid soap.  You can apply chg directly  to the skin and wash                       Gently with a scrungie or clean washcloth.  5.  Apply the CHG Soap to your body ONLY FROM THE NECK DOWN.   Do not use on face/ open                           Wound or open sores. Avoid contact with eyes, ears mouth and genitals (private parts).                       Wash face,  Genitals (private parts) with your normal soap.             6.  Wash thoroughly, paying special attention to the area where your surgery  will be performed.  7.  Thoroughly rinse your body with warm water from the neck down.  8.  DO NOT shower/wash with your normal soap after using and rinsing off  the CHG Soap.                9.  Pat yourself dry with a clean towel.            10.  Wear clean pajamas.            11.  Place clean sheets on your bed the night of your first shower and do not  sleep with pets. Day of Surgery : Do not apply any lotions/deodorants the morning of surgery.  Please wear clean clothes to the hospital/surgery center.  FAILURE TO FOLLOW THESE INSTRUCTIONS MAY RESULT IN THE CANCELLATION OF YOUR SURGERY PATIENT SIGNATURE_________________________________  NURSE  SIGNATURE__________________________________  ________________________________________________________________________

## 2015-11-16 ENCOUNTER — Other Ambulatory Visit: Payer: Self-pay | Admitting: General Surgery

## 2015-11-16 LAB — HEMOGLOBIN A1C
HEMOGLOBIN A1C: 6 % — AB (ref 4.8–5.6)
MEAN PLASMA GLUCOSE: 126 mg/dL

## 2015-11-17 ENCOUNTER — Other Ambulatory Visit: Payer: Self-pay | Admitting: General Surgery

## 2015-11-18 ENCOUNTER — Inpatient Hospital Stay (HOSPITAL_COMMUNITY)
Admission: RE | Admit: 2015-11-18 | Discharge: 2015-11-21 | DRG: 331 | Disposition: A | Discharge status: Home / Self Care | Payer: Commercial Managed Care - HMO | Source: Ambulatory Visit | Attending: General Surgery | Admitting: General Surgery

## 2015-11-18 ENCOUNTER — Inpatient Hospital Stay (HOSPITAL_COMMUNITY): Payer: Commercial Managed Care - HMO | Admitting: Certified Registered Nurse Anesthetist

## 2015-11-18 ENCOUNTER — Encounter (HOSPITAL_COMMUNITY)
Admission: RE | Disposition: A | Discharge status: Home / Self Care | Payer: Self-pay | Source: Ambulatory Visit | Attending: General Surgery

## 2015-11-18 ENCOUNTER — Encounter (HOSPITAL_COMMUNITY): Payer: Self-pay | Admitting: *Deleted

## 2015-11-18 DIAGNOSIS — Z01812 Encounter for preprocedural laboratory examination: Secondary | ICD-10-CM

## 2015-11-18 DIAGNOSIS — K5730 Diverticulosis of large intestine without perforation or abscess without bleeding: Secondary | ICD-10-CM | POA: Diagnosis present

## 2015-11-18 DIAGNOSIS — N824 Other female intestinal-genital tract fistulae: Secondary | ICD-10-CM | POA: Diagnosis present

## 2015-11-18 DIAGNOSIS — Z79899 Other long term (current) drug therapy: Secondary | ICD-10-CM | POA: Diagnosis not present

## 2015-11-18 DIAGNOSIS — K429 Umbilical hernia without obstruction or gangrene: Secondary | ICD-10-CM | POA: Diagnosis present

## 2015-11-18 DIAGNOSIS — D125 Benign neoplasm of sigmoid colon: Secondary | ICD-10-CM | POA: Diagnosis not present

## 2015-11-18 LAB — TYPE AND SCREEN
ABO/RH(D): A POS
Antibody Screen: NEGATIVE

## 2015-11-18 SURGERY — COLECTOMY, PARTIAL, ROBOT-ASSISTED, LAPAROSCOPIC
Anesthesia: General

## 2015-11-18 MED ORDER — LEVOTHYROXINE SODIUM 100 MCG PO TABS
100.0000 ug | ORAL_TABLET | Freq: Every day | ORAL | Status: DC
  Administered 2015-11-19 – 2015-11-21 (×3): 100 ug via ORAL
  Filled 2015-11-18 (×4): qty 1

## 2015-11-18 MED ORDER — LYSINE 500 MG PO CAPS: 500.0000 mg | ORAL_CAPSULE | Freq: Every morning | ORAL | Status: DC

## 2015-11-18 MED ORDER — MIDAZOLAM HCL 2 MG/2ML IJ SOLN
INTRAMUSCULAR | Status: AC
  Filled 2015-11-18: qty 2

## 2015-11-18 MED ORDER — SODIUM CHLORIDE 0.9 % IJ SOLN
INTRAMUSCULAR | Status: AC
  Filled 2015-11-18: qty 10

## 2015-11-18 MED ORDER — ACETAMINOPHEN 500 MG PO TABS
1000.0000 mg | ORAL_TABLET | Freq: Four times a day (QID) | ORAL | Status: AC
  Administered 2015-11-18 – 2015-11-19 (×4): 1000 mg via ORAL
  Filled 2015-11-18 (×4): qty 2

## 2015-11-18 MED ORDER — ONDANSETRON HCL 4 MG/2ML IJ SOLN: 4.0000 mg | Freq: Four times a day (QID) | INTRAMUSCULAR | Status: DC | PRN

## 2015-11-18 MED ORDER — ALVIMOPAN 12 MG PO CAPS
12.0000 mg | ORAL_CAPSULE | Freq: Two times a day (BID) | ORAL | Status: DC
  Administered 2015-11-19 – 2015-11-20 (×3): 12 mg via ORAL
  Filled 2015-11-18 (×4): qty 1

## 2015-11-18 MED ORDER — ROCURONIUM BROMIDE 100 MG/10ML IV SOLN
INTRAVENOUS | Status: DC | PRN
  Administered 2015-11-18: 40 mg via INTRAVENOUS
  Administered 2015-11-18 (×2): 20 mg via INTRAVENOUS

## 2015-11-18 MED ORDER — LACTATED RINGERS IV SOLN
INTRAVENOUS | Status: DC
  Administered 2015-11-18 (×3): via INTRAVENOUS

## 2015-11-18 MED ORDER — ACETAMINOPHEN 10 MG/ML IV SOLN
1000.0000 mg | Freq: Once | INTRAVENOUS | Status: AC
  Administered 2015-11-18: 1000 mg via INTRAVENOUS

## 2015-11-18 MED ORDER — BUPIVACAINE-EPINEPHRINE 0.25% -1:200000 IJ SOLN
INTRAMUSCULAR | Status: DC | PRN
  Administered 2015-11-18: 30 mL

## 2015-11-18 MED ORDER — ROCURONIUM BROMIDE 100 MG/10ML IV SOLN
INTRAVENOUS | Status: AC
  Filled 2015-11-18: qty 1

## 2015-11-18 MED ORDER — MIDAZOLAM HCL 5 MG/5ML IJ SOLN
INTRAMUSCULAR | Status: DC | PRN
  Administered 2015-11-18: 2 mg via INTRAVENOUS

## 2015-11-18 MED ORDER — HYDROMORPHONE HCL 1 MG/ML IJ SOLN: 0.2500 mg | INTRAMUSCULAR | Status: DC | PRN

## 2015-11-18 MED ORDER — ALVIMOPAN 12 MG PO CAPS
12.0000 mg | ORAL_CAPSULE | Freq: Once | ORAL | Status: AC
  Administered 2015-11-18: 12 mg via ORAL
  Filled 2015-11-18: qty 1

## 2015-11-18 MED ORDER — SUCCINYLCHOLINE 20MG/ML (10ML) SYRINGE FOR MEDFUSION PUMP - OPTIME
INTRAMUSCULAR | Status: DC | PRN
  Administered 2015-11-18: 100 mg via INTRAVENOUS

## 2015-11-18 MED ORDER — PROPOFOL 10 MG/ML IV BOLUS
INTRAVENOUS | Status: AC
  Filled 2015-11-18: qty 20

## 2015-11-18 MED ORDER — OXYCODONE HCL 5 MG PO TABS: 5.0000 mg | ORAL_TABLET | Freq: Once | ORAL | Status: DC | PRN

## 2015-11-18 MED ORDER — CEFOTETAN DISODIUM-DEXTROSE 2-2.08 GM-% IV SOLR
INTRAVENOUS | Status: AC
  Filled 2015-11-18: qty 50

## 2015-11-18 MED ORDER — HEPARIN SODIUM (PORCINE) 5000 UNIT/ML IJ SOLN
5000.0000 [IU] | Freq: Once | INTRAMUSCULAR | Status: AC
  Administered 2015-11-18: 5000 [IU] via SUBCUTANEOUS
  Filled 2015-11-18: qty 1

## 2015-11-18 MED ORDER — DEXAMETHASONE SODIUM PHOSPHATE 10 MG/ML IJ SOLN
INTRAMUSCULAR | Status: AC
  Filled 2015-11-18: qty 1

## 2015-11-18 MED ORDER — LACTATED RINGERS IR SOLN
Status: DC | PRN
  Administered 2015-11-18: 1000 mL

## 2015-11-18 MED ORDER — CEFOTETAN DISODIUM 2 G IJ SOLR
2.0000 g | INTRAMUSCULAR | Status: AC
  Administered 2015-11-18: 2 g via INTRAVENOUS
  Filled 2015-11-18: qty 2

## 2015-11-18 MED ORDER — FENTANYL CITRATE (PF) 100 MCG/2ML IJ SOLN
INTRAMUSCULAR | Status: AC
  Filled 2015-11-18: qty 2

## 2015-11-18 MED ORDER — BUPIVACAINE-EPINEPHRINE (PF) 0.25% -1:200000 IJ SOLN
INTRAMUSCULAR | Status: AC
  Filled 2015-11-18: qty 30

## 2015-11-18 MED ORDER — SUGAMMADEX SODIUM 200 MG/2ML IV SOLN
INTRAVENOUS | Status: DC | PRN
  Administered 2015-11-18: 200 mg via INTRAVENOUS

## 2015-11-18 MED ORDER — METHYLENE BLUE 0.5 % INJ SOLN
INTRAVENOUS | Status: AC
  Filled 2015-11-18: qty 10

## 2015-11-18 MED ORDER — LIDOCAINE HCL (CARDIAC) 20 MG/ML IV SOLN
INTRAVENOUS | Status: DC | PRN
  Administered 2015-11-18: 80 mg via INTRAVENOUS

## 2015-11-18 MED ORDER — MORPHINE SULFATE (PF) 2 MG/ML IV SOLN
2.0000 mg | INTRAVENOUS | Status: DC | PRN
  Administered 2015-11-18 – 2015-11-20 (×4): 2 mg via INTRAVENOUS
  Filled 2015-11-18 (×4): qty 1

## 2015-11-18 MED ORDER — ONDANSETRON HCL 4 MG/2ML IJ SOLN
INTRAMUSCULAR | Status: DC | PRN
  Administered 2015-11-18: 4 mg via INTRAVENOUS

## 2015-11-18 MED ORDER — OXYCODONE HCL 5 MG/5ML PO SOLN
5.0000 mg | Freq: Once | ORAL | Status: DC | PRN
  Filled 2015-11-18: qty 5

## 2015-11-18 MED ORDER — ESMOLOL HCL 100 MG/10ML IV SOLN
INTRAVENOUS | Status: DC | PRN
  Administered 2015-11-18: 10 mg via INTRAVENOUS

## 2015-11-18 MED ORDER — BISOPROLOL FUMARATE 10 MG PO TABS
10.0000 mg | ORAL_TABLET | Freq: Once | ORAL | Status: AC
  Administered 2015-11-18: 10 mg via ORAL
  Filled 2015-11-18: qty 1

## 2015-11-18 MED ORDER — ACETAMINOPHEN 10 MG/ML IV SOLN
INTRAVENOUS | Status: AC
  Filled 2015-11-18: qty 100

## 2015-11-18 MED ORDER — KCL IN DEXTROSE-NACL 20-5-0.45 MEQ/L-%-% IV SOLN
INTRAVENOUS | Status: DC
Start: 2015-11-18 — End: 2015-11-21
  Administered 2015-11-18: 1000 mL via INTRAVENOUS
  Administered 2015-11-19 – 2015-11-20 (×2): via INTRAVENOUS
  Filled 2015-11-18 (×6): qty 1000

## 2015-11-18 MED ORDER — DIPHENHYDRAMINE HCL 12.5 MG/5ML PO ELIX: 12.5000 mg | ORAL_SOLUTION | Freq: Four times a day (QID) | ORAL | Status: DC | PRN

## 2015-11-18 MED ORDER — PROPOFOL 10 MG/ML IV BOLUS
INTRAVENOUS | Status: DC | PRN
  Administered 2015-11-18: 120 mg via INTRAVENOUS

## 2015-11-18 MED ORDER — ONDANSETRON HCL 4 MG PO TABS: 4.0000 mg | ORAL_TABLET | Freq: Four times a day (QID) | ORAL | Status: DC | PRN

## 2015-11-18 MED ORDER — FENTANYL CITRATE (PF) 250 MCG/5ML IJ SOLN
INTRAMUSCULAR | Status: DC | PRN
  Administered 2015-11-18 (×5): 50 ug via INTRAVENOUS
  Administered 2015-11-18: 100 ug via INTRAVENOUS
  Administered 2015-11-18 (×2): 50 ug via INTRAVENOUS

## 2015-11-18 MED ORDER — DEXAMETHASONE SODIUM PHOSPHATE 10 MG/ML IJ SOLN
INTRAMUSCULAR | Status: DC | PRN
  Administered 2015-11-18: 10 mg via INTRAVENOUS

## 2015-11-18 MED ORDER — BISOPROLOL-HYDROCHLOROTHIAZIDE 10-6.25 MG PO TABS
1.0000 | ORAL_TABLET | Freq: Every morning | ORAL | Status: DC
  Administered 2015-11-19 – 2015-11-21 (×3): 1 via ORAL
  Filled 2015-11-18 (×3): qty 1

## 2015-11-18 MED ORDER — ONDANSETRON HCL 4 MG/2ML IJ SOLN
INTRAMUSCULAR | Status: AC
  Filled 2015-11-18: qty 2

## 2015-11-18 MED ORDER — EPHEDRINE SULFATE 50 MG/ML IJ SOLN
INTRAMUSCULAR | Status: DC | PRN
  Administered 2015-11-18 (×4): 5 mg via INTRAVENOUS

## 2015-11-18 MED ORDER — DIPHENHYDRAMINE HCL 50 MG/ML IJ SOLN
12.5000 mg | Freq: Four times a day (QID) | INTRAMUSCULAR | Status: DC | PRN
Start: 2015-11-18 — End: 2015-11-21

## 2015-11-18 MED ORDER — DEXTROSE 5 % IV SOLN
2.0000 g | Freq: Two times a day (BID) | INTRAVENOUS | Status: AC
  Administered 2015-11-18: 2 g via INTRAVENOUS
  Filled 2015-11-18: qty 2

## 2015-11-18 MED ORDER — SUGAMMADEX SODIUM 200 MG/2ML IV SOLN
INTRAVENOUS | Status: AC
  Filled 2015-11-18: qty 2

## 2015-11-18 MED ORDER — BENAZEPRIL HCL 20 MG PO TABS
20.0000 mg | ORAL_TABLET | Freq: Every morning | ORAL | Status: DC
  Administered 2015-11-19 – 2015-11-21 (×3): 20 mg via ORAL
  Filled 2015-11-18 (×3): qty 1

## 2015-11-18 MED ORDER — LORATADINE 10 MG PO TABS
10.0000 mg | ORAL_TABLET | Freq: Every morning | ORAL | Status: DC
  Administered 2015-11-19 – 2015-11-21 (×3): 10 mg via ORAL
  Filled 2015-11-18 (×3): qty 1

## 2015-11-18 MED ORDER — PHENYLEPHRINE HCL 10 MG/ML IJ SOLN
INTRAMUSCULAR | Status: DC | PRN
  Administered 2015-11-18: 40 ug via INTRAVENOUS

## 2015-11-18 MED ORDER — ATORVASTATIN CALCIUM 80 MG PO TABS
80.0000 mg | ORAL_TABLET | Freq: Every evening | ORAL | Status: DC
  Administered 2015-11-18 – 2015-11-20 (×3): 80 mg via ORAL
  Filled 2015-11-18 (×4): qty 1

## 2015-11-18 MED ORDER — ENOXAPARIN SODIUM 40 MG/0.4ML ~~LOC~~ SOLN
40.0000 mg | SUBCUTANEOUS | Status: DC
  Administered 2015-11-19 – 2015-11-21 (×3): 40 mg via SUBCUTANEOUS
  Filled 2015-11-18 (×4): qty 0.4

## 2015-11-18 MED ORDER — EPHEDRINE SULFATE 50 MG/ML IJ SOLN
INTRAMUSCULAR | Status: AC
  Filled 2015-11-18: qty 1

## 2015-11-18 SURGICAL SUPPLY — 91 items
BLADE EXTENDED COATED 6.5IN (ELECTRODE) IMPLANT
CANNULA REDUC XI 12-8 STAPL (CANNULA) ×1
CANNULA REDUC XI 12-8MM STAPL (CANNULA) ×1
CANNULA REDUCER 12-8 DVNC XI (CANNULA) ×1 IMPLANT
CELLS DAT CNTRL 66122 CELL SVR (MISCELLANEOUS) IMPLANT
CLIP LIGATING HEM O LOK PURPLE (MISCELLANEOUS) IMPLANT
CLIP LIGATING HEMOLOK MED (MISCELLANEOUS) IMPLANT
COUNTER NEEDLE 20 DBL MAG RED (NEEDLE) ×3 IMPLANT
COVER MAYO STAND STRL (DRAPES) ×6 IMPLANT
COVER TIP SHEARS 8 DVNC (MISCELLANEOUS) ×1 IMPLANT
COVER TIP SHEARS 8MM DA VINCI (MISCELLANEOUS) ×2
DECANTER SPIKE VIAL GLASS SM (MISCELLANEOUS) ×3 IMPLANT
DEVICE TROCAR PUNCTURE CLOSURE (ENDOMECHANICALS) IMPLANT
DRAPE ARM DVNC X/XI (DISPOSABLE) ×4 IMPLANT
DRAPE COLUMN DVNC XI (DISPOSABLE) ×1 IMPLANT
DRAPE DA VINCI XI ARM (DISPOSABLE) ×8
DRAPE DA VINCI XI COLUMN (DISPOSABLE) ×2
DRAPE SURG IRRIG POUCH 19X23 (DRAPES) ×3 IMPLANT
DRSG OPSITE POSTOP 4X10 (GAUZE/BANDAGES/DRESSINGS) IMPLANT
DRSG OPSITE POSTOP 4X6 (GAUZE/BANDAGES/DRESSINGS) ×2 IMPLANT
DRSG OPSITE POSTOP 4X8 (GAUZE/BANDAGES/DRESSINGS) IMPLANT
ELECT PENCIL ROCKER SW 15FT (MISCELLANEOUS) ×6 IMPLANT
ELECT REM PT RETURN 15FT ADLT (MISCELLANEOUS) ×3 IMPLANT
ENDOLOOP SUT PDS II  0 18 (SUTURE)
ENDOLOOP SUT PDS II 0 18 (SUTURE) IMPLANT
EVACUATOR SILICONE 100CC (DRAIN) IMPLANT
GAUZE SPONGE 4X4 12PLY STRL (GAUZE/BANDAGES/DRESSINGS) IMPLANT
GLOVE BIO SURGEON STRL SZ 6.5 (GLOVE) ×6 IMPLANT
GLOVE BIO SURGEONS STRL SZ 6.5 (GLOVE) ×3
GLOVE BIOGEL PI IND STRL 7.0 (GLOVE) ×3 IMPLANT
GLOVE BIOGEL PI INDICATOR 7.0 (GLOVE) ×6
GOWN STRL REUS W/TWL 2XL LVL3 (GOWN DISPOSABLE) ×9 IMPLANT
GOWN STRL REUS W/TWL XL LVL3 (GOWN DISPOSABLE) ×12 IMPLANT
HOLDER FOLEY CATH W/STRAP (MISCELLANEOUS) ×3 IMPLANT
LEGGING LITHOTOMY PAIR STRL (DRAPES) ×3 IMPLANT
LIQUID BAND (GAUZE/BANDAGES/DRESSINGS) ×2 IMPLANT
NDL INSUFFLATION 14GA 120MM (NEEDLE) ×1 IMPLANT
NEEDLE INSUFFLATION 14GA 120MM (NEEDLE) ×3 IMPLANT
PACK CARDIOVASCULAR III (CUSTOM PROCEDURE TRAY) ×3 IMPLANT
PACK COLON (CUSTOM PROCEDURE TRAY) ×3 IMPLANT
PORT LAP GEL ALEXIS MED 5-9CM (MISCELLANEOUS) IMPLANT
RELOAD STAPLE 45 BLU REG DVNC (STAPLE) IMPLANT
RELOAD STAPLE 45 GRN THCK DVNC (STAPLE) IMPLANT
RETRACTOR WND ALEXIS 18 MED (MISCELLANEOUS) IMPLANT
RTRCTR WOUND ALEXIS 18CM MED (MISCELLANEOUS)
SCISSORS LAP 5X35 DISP (ENDOMECHANICALS) ×3 IMPLANT
SEAL CANN UNIV 5-8 DVNC XI (MISCELLANEOUS) ×3 IMPLANT
SEAL XI 5MM-8MM UNIVERSAL (MISCELLANEOUS) ×6
SEALER VESSEL DA VINCI XI (MISCELLANEOUS) ×2
SEALER VESSEL EXT DVNC XI (MISCELLANEOUS) ×1 IMPLANT
SET BI-LUMEN FLTR TB AIRSEAL (TUBING) ×6 IMPLANT
SET TUBE IRRIG SUCTION NO TIP (IRRIGATION / IRRIGATOR) ×3 IMPLANT
SLEEVE XCEL OPT CAN 5 100 (ENDOMECHANICALS) IMPLANT
SOLUTION ELECTROLUBE (MISCELLANEOUS) ×3 IMPLANT
STAPLER 45 BLU RELOAD XI (STAPLE) ×1 IMPLANT
STAPLER 45 BLUE RELOAD XI (STAPLE) ×2
STAPLER 45 GREEN RELOAD XI (STAPLE)
STAPLER 45 GRN RELOAD XI (STAPLE) IMPLANT
STAPLER CANNULA SEAL DVNC XI (STAPLE) ×1 IMPLANT
STAPLER CANNULA SEAL XI (STAPLE) ×2
STAPLER CIRC ILS CVD 33MM 37CM (STAPLE) ×2 IMPLANT
STAPLER SHEATH (SHEATH) ×2
STAPLER SHEATH ENDOWRIST DVNC (SHEATH) ×1 IMPLANT
STAPLER VISISTAT 35W (STAPLE) ×3 IMPLANT
SUT NOVA 1 T20/GS 25DT (SUTURE) ×12 IMPLANT
SUT PDS AB 1 CTX 36 (SUTURE) IMPLANT
SUT PDS AB 1 TP1 96 (SUTURE) IMPLANT
SUT PROLENE 2 0 KS (SUTURE) ×5 IMPLANT
SUT SILK 2 0 (SUTURE) ×3
SUT SILK 2 0 SH CR/8 (SUTURE) ×3 IMPLANT
SUT SILK 2-0 18XBRD TIE 12 (SUTURE) ×1 IMPLANT
SUT SILK 3 0 (SUTURE) ×3
SUT SILK 3 0 SH CR/8 (SUTURE) ×3 IMPLANT
SUT SILK 3-0 18XBRD TIE 12 (SUTURE) ×1 IMPLANT
SUT V-LOC BARB 180 2/0GR6 GS22 (SUTURE)
SUT VIC AB 2-0 SH 18 (SUTURE) ×3 IMPLANT
SUT VIC AB 2-0 SH 27 (SUTURE) ×3
SUT VIC AB 2-0 SH 27X BRD (SUTURE) ×1 IMPLANT
SUT VIC AB 3-0 SH 18 (SUTURE) IMPLANT
SUT VIC AB 4-0 PS2 27 (SUTURE) ×8 IMPLANT
SUTURE V-LC BRB 180 2/0GR6GS22 (SUTURE) IMPLANT
SYRINGE 10CC LL (SYRINGE) ×3 IMPLANT
SYS LAPSCP GELPORT 120MM (MISCELLANEOUS)
SYSTEM LAPSCP GELPORT 120MM (MISCELLANEOUS) IMPLANT
TOWEL OR 17X26 10 PK STRL BLUE (TOWEL DISPOSABLE) ×3 IMPLANT
TOWEL OR NON WOVEN STRL DISP B (DISPOSABLE) ×3 IMPLANT
TRAY FOLEY W/METER SILVER 14FR (SET/KITS/TRAYS/PACK) ×3 IMPLANT
TRAY FOLEY W/METER SILVER 16FR (SET/KITS/TRAYS/PACK) ×1 IMPLANT
TROCAR BLADELESS OPT 5 100 (ENDOMECHANICALS) ×3 IMPLANT
TUBING CONNECTING 10 (TUBING) IMPLANT
TUBING CONNECTING 10' (TUBING)

## 2015-11-18 NOTE — Anesthesia Postprocedure Evaluation (Signed)
Anesthesia Post Note  Patient: Cassandra [name]  Procedure(s) Performed: Procedure(s) (LRB): XI ROBOT ASSISTED LAPAROSCOPIC PARTIAL COLECTOMY WITH UMBILICAL HERNIA REPAIR (N/A)  Patient location during evaluation: PACU Anesthesia Type: General Level of consciousness: awake and alert and patient cooperative Pain management: pain level controlled Vital Signs Assessment: post-procedure vital signs reviewed and stable Respiratory status: spontaneous breathing and respiratory function stable Cardiovascular status: stable Anesthetic complications: no    Last Vitals:  Filed Vitals:   11/18/15 1715 11/18/15 1733  BP: 158/84 171/80  Pulse: 71 72  Temp: 36.7 C 36.6 C  Resp: 18 18    Last Pain:  Filed Vitals:   11/18/15 1736  PainSc: [place]

## 2015-11-18 NOTE — Anesthesia Preprocedure Evaluation (Signed)
Anesthesia Evaluation  Patient identified by MRN, date of birth, ID band Patient awake    Reviewed: Allergy & Precautions, NPO status , Patient's Chart, lab work & pertinent test results  Airway Mallampati: II   Neck ROM: full    Dental   Pulmonary Current Smoker,    breath sounds clear to auscultation       Cardiovascular hypertension,  Rhythm:regular Rate:Normal     Neuro/Psych    GI/Hepatic   Endo/Other  Hypothyroidism obese  Renal/GU      Musculoskeletal   Abdominal   Peds  Hematology   Anesthesia Other Findings   Reproductive/Obstetrics                             Anesthesia Physical Anesthesia Plan  ASA: II  Anesthesia Plan: General   Post-op Pain Management:    Induction: Intravenous  Airway Management Planned: Oral ETT  Additional Equipment:   Intra-op Plan:   Post-operative Plan: Extubation in OR  Informed Consent: I have reviewed the patients History and Physical, chart, labs and discussed the procedure including the risks, benefits and alternatives for the proposed anesthesia with the patient or authorized representative who has indicated his/her understanding and acceptance.     Plan Discussed with: CRNA, Anesthesiologist and Surgeon  Anesthesia Plan Comments:         Anesthesia Quick Evaluation

## 2015-11-18 NOTE — Anesthesia Procedure Notes (Signed)
Procedure Name: Intubation Date/Time: 11/18/2015 12:50 PM Performed by: West Pugh Pre-anesthesia Checklist: Patient identified, Emergency Drugs available, Suction available, Patient being monitored and Timeout performed Patient Re-evaluated:Patient Re-evaluated prior to inductionOxygen Delivery Method: Circle system utilized Preoxygenation: Pre-oxygenation with 100% oxygen Intubation Type: IV induction and Cricoid Pressure applied Ventilation: Mask ventilation without difficulty Laryngoscope Size: Mac and 3 Grade View: Grade II Tube type: Oral Tube size: 7.0 mm Number of attempts: 1 Airway Equipment and Method: Stylet Placement Confirmation: ETT inserted through vocal cords under direct vision,  positive ETCO2,  CO2 detector and breath sounds checked- equal and bilateral Secured at: 21 cm Tube secured with: Tape Dental Injury: Teeth and Oropharynx as per pre-operative assessment

## 2015-11-18 NOTE — Progress Notes (Signed)
Paged Dr Marcello Moores to inform her that patient did do the bowel prep but ate solid food all day.  Was not able to connect with her

## 2015-11-18 NOTE — H&P (Signed)
The patient is a 70 year old female who presents with diverticulitis. This 70 year old female who presents to the office with vaginal drainage since November. She states this occurs after bowel movements. She underwent a CT scan which showed diverticulitis in the area near her vagina on the left side. She denies any surgical history. Her last colonoscopy was less than 2 years ago and normal per patient's recollection.  She denies any rectal bleeding. She denies any pain.   Other Problems Malachi Bonds, CMA; 10/17/2015 8:47 AM) Breast Cancer High blood pressure Hypercholesterolemia Thyroid Disease  Past Surgical History Malachi Bonds, CMA; 10/17/2015 8:47 AM) Breast Biopsy Right. Breast Mass; Local Excision Right. Sentinel Lymph Node Biopsy Tonsillectomy  Diagnostic Studies History Malachi Bonds, CMA; 10/17/2015 8:47 AM) Colonoscopy within last year Mammogram within last year Pap Smear 1-5 years ago  Allergies Malachi Bonds, CMA; 10/17/2015 8:48 AM) No Known Drug Allergies 10/17/2015  Medication History Malachi Bonds, CMA; 10/17/2015 8:49 AM) MetroNIDAZOLE (500MG  Tablet, Oral) Active. Sulfamethoxazole-Trimethoprim (800-160MG  Tablet, Oral) Active. Levothyroxine Sodium (100MCG Tablet, Oral) Active. Atorvastatin Calcium (80MG  Tablet, Oral) Active. Bisoprolol-Hydrochlorothiazide (10-6.25MG  Tablet, Oral) Active. Benazepril HCl (20MG  Tablet, Oral) Active. Medications Reconciled  Social History Malachi Bonds, CMA; 10/17/2015 8:47 AM) Alcohol use Occasional alcohol use. Caffeine use Carbonated beverages, Coffee, Tea. No drug use Tobacco use Never smoker.  Family History Malachi Bonds, CMA; 10/17/2015 8:47 AM) Arthritis Mother. Cerebrovascular Accident Brother. Heart Disease Brother. Hypertension Mother. Migraine Headache Father. Prostate Cancer Father. Respiratory Condition Father, Mother.  Pregnancy / Birth History Malachi Bonds, CMA;  10/17/2015 8:47 AM) Age at menarche 40 years. Age of menopause 15-60 Gravida 0 Para 0 Regular periods     Review of Systems  General Not Present- Appetite Loss, Chills, Fatigue, Fever, Night Sweats, Weight Gain and Weight Loss. Skin Not Present- Change in Wart/Mole, Dryness, Hives, Jaundice, New Lesions, Non-Healing Wounds, Rash and Ulcer. HEENT Not Present- Earache, Hearing Loss, Hoarseness, Nose Bleed, Oral Ulcers, Ringing in the Ears, Seasonal Allergies, Sinus Pain, Sore Throat, Visual Disturbances, Wears glasses/contact lenses and Yellow Eyes. Respiratory Not Present- Bloody sputum, Chronic Cough, Difficulty Breathing, Snoring and Wheezing. Breast Not Present- Breast Mass, Breast Pain, Nipple Discharge and Skin Changes. Cardiovascular Not Present- Chest Pain, Difficulty Breathing Lying Down, Leg Cramps, Palpitations, Rapid Heart Rate, Shortness of Breath and Swelling of Extremities. Gastrointestinal Present- Change in Bowel Habits. Not Present- Abdominal Pain, Bloating, Bloody Stool, Chronic diarrhea, Constipation, Difficulty Swallowing, Excessive gas, Gets full quickly at meals, Hemorrhoids, Indigestion, Nausea, Rectal Pain and Vomiting. Female Genitourinary Present- Frequency. Not Present- Nocturia, Painful Urination, Pelvic Pain and Urgency. Musculoskeletal Present- Joint Pain. Not Present- Back Pain, Joint Stiffness, Muscle Pain, Muscle Weakness and Swelling of Extremities. Neurological Not Present- Decreased Memory, Fainting, Headaches, Numbness, Seizures, Tingling, Tremor, Trouble walking and Weakness. Psychiatric Not Present- Anxiety, Bipolar, Change in Sleep Pattern, Depression, Fearful and Frequent crying. Endocrine Not Present- Cold Intolerance, Excessive Hunger, Hair Changes, Heat Intolerance, Hot flashes and New Diabetes. Hematology Not Present- Easy Bruising, Excessive bleeding, Gland problems, HIV and Persistent Infections.  BP 146/86 mmHg  Pulse 78  Temp(Src) 98.4 F  (36.9 C) (Oral)  Resp 18  Ht 5' (1.524 m)  Wt 81.647 kg (180 lb)  BMI 35.15 kg/m2  SpO2 97%     Physical Exam  General Mental Status-Alert. General Appearance-Not in acute distress. Build & Nutrition-Well nourished. Posture-Normal posture. Gait-Normal.  Head and Neck Head-normocephalic, atraumatic with no lesions or palpable masses. Trachea-midline.  Chest and Lung Exam Chest and lung exam reveals -on  auscultation, normal breath sounds, no adventitious sounds and normal vocal resonance.  Cardiovascular Cardiovascular examination reveals -normal heart sounds, regular rate and rhythm with no murmurs.  Abdomen Inspection Inspection of the abdomen reveals - Note: Umbilical hernia present, approximately 2 cm fascial defect. Palpation/Percussion Palpation and Percussion of the abdomen reveal - Soft, Non Tender, No Rigidity (guarding), No hepatosplenomegaly and No Palpable abdominal masses.  Neurologic Neurologic evaluation reveals -alert and oriented x 3 with no impairment of recent or remote memory, normal attention span and ability to concentrate, normal sensation and normal coordination.  Musculoskeletal Normal Exam - Bilateral-Upper Extremity Strength Normal and Lower Extremity Strength Normal.    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 We will check with [facility] and confirm she has had a recent colonoscopy. After this, I believe she can be scheduled for a minimally invasive sigmoidectmy. We will close her umbilical hernia as well. The surgery and anatomy were described to the patient as well as the risks of surgery and the possible complications. These include: Bleeding, deep abdominal infections and possible wound complications such as hernia and infection, damage to adjacent structures, leak of surgical connections, which can lead to other surgeries and possibly  an ostomy, possible need for other procedures, such as abscess drains in radiology, possible prolonged hospital stay, possible diarrhea from removal of part of the colon, possible constipation from narcotics, possible bowel, bladder or sexual dysfunction if having rectal surgery, prolonged fatigue/weakness or appetite loss, possible early recurrence of of disease, possible complications of their medical problems such as heart disease or arrhythmias or lung problems, death (less than 1%). I believe the patient understands and wishes to proceed with the surgery.

## 2015-11-18 NOTE — Transfer of Care (Signed)
Immediate Anesthesia Transfer of Care Note  Patient: Cassandra [name]  Procedure(s) Performed: Procedure(s): XI ROBOT ASSISTED LAPAROSCOPIC PARTIAL COLECTOMY WITH UMBILICAL HERNIA REPAIR (N/A)  Patient Location: PACU  Anesthesia Type:General  Level of Consciousness: awake, alert  and oriented  Airway & Oxygen Therapy: Patient Spontanous Breathing and Patient connected to face mask oxygen  Post-op Assessment: Report given to RN and Post -op Vital signs reviewed and stable  Post vital signs: Reviewed and stable  Last Vitals:  Filed Vitals:   11/18/15 0925  BP: 146/86  Pulse: 78  Temp: 36.9 C  Resp: 18    Complications: No apparent anesthesia complications

## 2015-11-18 NOTE — Op Note (Signed)
11/18/2015  4:37 PM  PATIENT:  Cassandra [name]  70 y.o. female  Patient Care Team: Harlan Stains, MD as PCP - General (Family Medicine)  PRE-OPERATIVE DIAGNOSIS:  colovaginal fistula, diverticular disease  POST-OPERATIVE DIAGNOSIS:  colovaginal fistula diverticular disease  PROCEDURE:   XI ROBOT ASSISTED SIGMOID COLECTOMY WITH UMBILICAL HERNIA REPAIR   Surgeon(s): Leighton Ruff, MD Ralene Ok, MD  ASSISTANT: Dr Rosendo Gros   ANESTHESIA:   local and general  EBL: 167ml Total I/O In: 1000 [I.V.:1000] Out: 250 [Urine:150; Blood:100]  Delay start of Pharmacological VTE agent (>24hrs) due to surgical blood loss or risk of bleeding:  no  DRAINS: none   SPECIMEN:  Source of Specimen:  Sigmoid colon  DISPOSITION OF SPECIMEN:  PATHOLOGY  COUNTS:  YES  PLAN OF CARE: Admit to inpatient   PATIENT DISPOSITION:  PACU - hemodynamically stable.  INDICATION:    70 y.o. F with colovaginal fistula.   I recommended segmental resection:  The anatomy & physiology of the digestive tract was discussed.  The pathophysiology was discussed.  Natural history risks without surgery was discussed.   I worked to give an overview of the disease and the frequent need to have multispecialty involvement.  I feel the risks of no intervention will lead to serious problems that outweigh the operative risks; therefore, I recommended a partial colectomy to remove the pathology.  Laparoscopic & open techniques were discussed.   Risks such as bleeding, infection, abscess, leak, reoperation, possible ostomy, hernia, heart attack, death, and other risks were discussed.  I noted a good likelihood this will help address the problem.   Goals of post-operative recovery were discussed as well.    The patient expressed understanding & wished to proceed with surgery.  OR FINDINGS:   Patient had colovaginal fistula on the L side of the vaginal wall.  No obvious defect after resection.    The anastomosis rests ~15  cm from the anal verge by rigid proctoscopy.  DESCRIPTION:   Informed consent was confirmed.  The patient underwent general anaesthesia without difficulty.  The patient was positioned appropriately.  VTE prevention in place.  The patient's abdomen was clipped, prepped, & draped in a sterile fashion.  Surgical timeout confirmed our plan.  The patient was positioned in reverse Trendelenburg.  Abdominal entry was gained using a Varies needle in the LUQ with the patient appropriately positioned.  Entry was clean.  I induced carbon dioxide insufflation.  The 13mm robotic port was placed.  Camera inspection revealed no injury.  Extra ports were carefully placed under direct laparoscopic visualization.  A 31mm port was placed in the RLQ.    The patient was placed in steep Trendelenberg.  I reflected the greater omentum and the upper abdomen the small bowel in the upper abdomen. The robot was docked to the patient's L side.  Ports were connected.  Instruments were placed under direct visualization.  I began by mobilizing the sigmoid colon out of the pelvis, sharply taking down her fistula with scissors.  Once the sigmoid was free and out of the pelvis.  I scored the base of peritoneum of the right side of the mesentery of the left colon to the peritoneal reflection of the mid rectum.  I elevated the sigmoid mesentery and enetered into the retro-mesenteric plane. We were able to identify the left ureter and gonadal vessels. We kept those posterior within the retroperitoneum and elevated the left colon mesentery off that. I did isolated IMA pedicle but did not ligate  it yet.  I continued distally and got into the avascular plane posterior to the mesorectum. This allowed me to help mobilize the rectum as well by freeing the mesorectum off the sacrum.  I mobilized the peritoneal coverings towards the peritoneal reflection on both the right and left sides of the rectum.  I could see the right and left ureters and stayed  away from them.    I skeletonized the inferior mesenteric artery pedicle.  After confirming the left ureter was out of the way, I went ahead and ligated the inferior mesenteric artery pedicle with bipolarrobotic vessel sealer several centimeters above its takeoff from the aorta.  We ensured hemostasis. I skeletonized the mesorectum at the junction at the proximal rectum using blunt dissection & bipolar vessel sealer.  I mobilized the left colon in a lateral to medial fashion off the line of T[place] up towards the splenic flexure to ensure good mobilization of the left colon to reach into the pelvis.  I used the vessel sealer to divide the mesentery to the level of normal colon proximal to her inflammation.  A blue load robotic stapler was used to transect the recto-sigmoid junction.  We then enlarged the RLQ 84mm port site and placed an [place] wound protector.  The colon was removed.  A purse stringer was placed at the previously skeletonized colon.  A 2-0 prolene suture was placed and secured with 3-0 silk sutures.  A 22mm EEA anvil was placed and the purse string was tied around this.  The surrounding fat was cleared away and the anvil was dropped back into the abdomen.  The cap was placed and the abdomen was insufflated.  An anastomosis was created with the EEA at the distal staple line laparoscopically.  There was no tension and no leak when insufflated under water.  The anastomosis rests at ~15cm.  The abdomen was irrigated.  Hemostasis was good.  The omentum was then brought down over the bowel.   We then switched to clean gowns, gloves, drapes and instruments.  The umbilical hernia was then closed with interrupted 0 Novafil sutures from the inside through the extraction site.  This site was then closed with interrupted Novofil sutures.  The subcutaneous tissue was closed with 2-0 Vicryl stiches and the skin of the extraction site and the ports sites were closed with 4-0 Vicryl stiches.  Sterile dressings  were applied.  The patient was awakened from anesthesia and sent to the PACU in stable condition.  All counts were correct per OR staff.

## 2015-11-18 NOTE — Progress Notes (Signed)
PHARMACIST - PHYSICIAN ORDER COMMUNICATION  CONCERNING: P&T Medication Policy on Herbal Medications  DESCRIPTION:  This patient's order for: Lysine has been noted.  This product(s) is classified as an "herbal" or natural product. Due to a lack of definitive safety studies or FDA approval, nonstandard manufacturing practices, plus the potential risk of unknown drug-drug interactions while on inpatient medications, the Pharmacy and Therapeutics Committee does not permit the use of "herbal" or natural products of this type within [facility].   ACTION TAKEN: The pharmacy department is unable to verify this order at this time and your patient has been informed of this safety policy. Please reevaluate patient's clinical condition at discharge and address if the herbal or natural product(s) should be resumed at that time.  Romeo Rabon, PharmD, pager [phone]. 11/18/2015,5:49 PM.

## 2015-11-19 LAB — BASIC METABOLIC PANEL
Anion gap: 9 (ref 5–15)
BUN: 14 mg/dL (ref 6–20)
CHLORIDE: 104 mmol/L (ref 101–111)
CO2: 25 mmol/L (ref 22–32)
CREATININE: 0.9 mg/dL (ref 0.44–1.00)
Calcium: 9 mg/dL (ref 8.9–10.3)
GFR calc Af Amer: >60 mL/min (ref >60)
GFR calc non Af Amer: >60 mL/min (ref >60)
Glucose, Bld: 151 mg/dL — ABNORMAL HIGH (ref 65–99)
POTASSIUM: 4.2 mmol/L (ref 3.5–5.1)
SODIUM: 138 mmol/L (ref 135–145)

## 2015-11-19 LAB — CBC
HEMATOCRIT: 37 % (ref 36.0–46.0)
Hemoglobin: 12 g/dL (ref 12.0–15.0)
MCH: 29.9 pg (ref 26.0–34.0)
MCHC: 32.4 g/dL (ref 30.0–36.0)
MCV: 92 fL (ref 78.0–100.0)
Platelets: 397 10*3/uL (ref 150–400)
RBC: 4.02 MIL/uL (ref 3.87–5.11)
RDW: 15.7 % — AB (ref 11.5–15.5)
WBC: 10.9 10*3/uL — AB (ref 4.0–10.5)

## 2015-11-19 NOTE — Progress Notes (Signed)
Patient ID: Cassandra [name], female   DOB: Jan 23, 1946, 70 y.o.   MRN: [id number]  [facility]  POD#: 1  Subjective: Patient in bed.  Comfortable, no complaints.  Walked once last night.  Objective: Vital signs in last 24 hours: Temp:  [97.8 F (36.6 C)-98.9 F (37.2 C)] 98.8 F (37.1 C) (02/25 0515) Pulse Rate:  [70-82] 79 (02/25 0515) Resp:  [9-20] 16 (02/25 0515) BP: (106-181)/(53-88) 151/76 mmHg (02/25 0515) SpO2:  [95 %-99 %] 96 % (02/25 0515) Weight:  [81.647 kg (180 lb)] 81.647 kg (180 lb) (02/24 1024) Last BM Date: 11/18/15  Intake/Output from previous day: 02/24 0701 - 02/25 0700 In: 4101.3 [P.O.:60; I.V.:4041.3] Out: 825 [Urine:725; Blood:100] Intake/Output this shift:    Physical Exam: HEENT - sclerae clear, mucous membranes moist Neck - soft Chest - clear bilaterally Cor - RRR Abdomen - soft, wounds dry and intact; BS present Ext - no edema, non-tender Neuro - alert & oriented, no focal deficits  [name] Results:   Recent Labs  11/19/15 0435  WBC 10.9*  HGB 12.0  HCT 37.0  PLT 397   BMET  Recent Labs  11/19/15 0435  NA 138  K 4.2  CL 104  CO2 25  GLUCOSE 151*  BUN 14  CREATININE 0.90  CALCIUM 9.0   PT/INR No results for input(s): LABPROT, INR in the last 72 hours. Comprehensive Metabolic Panel:    Component Value Date/Time   NA 138 11/19/2015 0435   NA 140 11/15/2015 1445   K 4.2 11/19/2015 0435   K 4.7 11/15/2015 1445   CL 104 11/19/2015 0435   CL 104 11/15/2015 1445   CO2 25 11/19/2015 0435   CO2 29 11/15/2015 1445   BUN 14 11/19/2015 0435   BUN 15 11/15/2015 1445   CREATININE 0.90 11/19/2015 0435   CREATININE 0.79 11/15/2015 1445   GLUCOSE 151* 11/19/2015 0435   GLUCOSE 128* 11/15/2015 1445   CALCIUM 9.0 11/19/2015 0435   CALCIUM 10.1 11/15/2015 1445   AST 25 01/09/2010 1526   AST 26 07/05/2009 1516   ALT 24 01/09/2010 1526   ALT 25 07/05/2009 1516   ALKPHOS 73 01/09/2010 1526   ALKPHOS 84 07/05/2009 1516   BILITOT 0.8 01/09/2010 1526   BILITOT 0.7 07/05/2009 1516   PROT 7.1 01/09/2010 1526   PROT 7.2 07/05/2009 1516   ALBUMIN 4.1 01/09/2010 1526   ALBUMIN 4.2 07/05/2009 1516    Studies/Results: No results found.  Assessment & Plans: Status post robotic sigmoid colectomy for colovaginal fistula, umb hernia repair  Begin clear liquid diet  Encouraged OOB, ambulation  Earnstine Regal, MD, [facility] Office: [place] 11/19/2015

## 2015-11-20 LAB — CBC
HEMATOCRIT: 37.7 % (ref 36.0–46.0)
Hemoglobin: 12 g/dL (ref 12.0–15.0)
MCH: 29.9 pg (ref 26.0–34.0)
MCHC: 31.8 g/dL (ref 30.0–36.0)
MCV: 94 fL (ref 78.0–100.0)
PLATELETS: 376 10*3/uL (ref 150–400)
RBC: 4.01 MIL/uL (ref 3.87–5.11)
RDW: 16.3 % — AB (ref 11.5–15.5)
WBC: 12.7 10*3/uL — AB (ref 4.0–10.5)

## 2015-11-20 LAB — BASIC METABOLIC PANEL
Anion gap: 9 (ref 5–15)
BUN: 8 mg/dL (ref 6–20)
CHLORIDE: 105 mmol/L (ref 101–111)
CO2: 27 mmol/L (ref 22–32)
CREATININE: 0.76 mg/dL (ref 0.44–1.00)
Calcium: 9.1 mg/dL (ref 8.9–10.3)
GFR calc Af Amer: >60 mL/min (ref >60)
GFR calc non Af Amer: >60 mL/min (ref >60)
GLUCOSE: 99 mg/dL (ref 65–99)
POTASSIUM: 3.8 mmol/L (ref 3.5–5.1)
SODIUM: 141 mmol/L (ref 135–145)

## 2015-11-20 NOTE — Progress Notes (Signed)
Patient ID: Janne Lab, female   DOB: Feb 14, 1946, 70 y.o.   MRN: [id number]  [facility]  POD#: 2  Subjective: Patient in bed, some upper abdominal discomfort.  Tolerated clear liquids - no nausea or emesis.  Passing flatus.  Ambulating in halls.  Objective: Vital signs in last 24 hours: Temp:  [98.1 F (36.7 C)-99.3 F (37.4 C)] 99.1 F (37.3 C) (02/26 0539) Pulse Rate:  [67-74] 74 (02/26 0539) Resp:  [16-18] 16 (02/26 0539) BP: (148-158)/(67-85) 158/71 mmHg (02/26 0539) SpO2:  [95 %-98 %] 95 % (02/26 0539) Last BM Date: 11/18/15  Intake/Output from previous day: 02/25 0701 - 02/26 0700 In: 2040 [P.O.:240; I.V.:1800] Out: 5525 [Urine:5525] Intake/Output this shift: Total I/O In: -  Out: 200 [Urine:200]  Physical Exam: HEENT - sclerae clear, mucous membranes moist Neck - soft Chest - clear bilaterally Cor - RRR Abdomen - soft without distension; BS present; wounds dry and intact Ext - no edema, non-tender Neuro - alert & oriented, no focal deficits  Lab Results:   Recent Labs  11/19/15 0435 11/20/15 0434  WBC 10.9* 12.7*  HGB 12.0 12.0  HCT 37.0 37.7  PLT 397 376   BMET  Recent Labs  11/19/15 0435 11/20/15 0434  NA 138 141  K 4.2 3.8  CL 104 105  CO2 25 27  GLUCOSE 151* 99  BUN 14 8  CREATININE 0.90 0.76  CALCIUM 9.0 9.1   PT/INR No results for input(s): LABPROT, INR in the last 72 hours. Comprehensive Metabolic Panel:    Component Value Date/Time   NA 141 11/20/2015 0434   NA 138 11/19/2015 0435   K 3.8 11/20/2015 0434   K 4.2 11/19/2015 0435   CL 105 11/20/2015 0434   CL 104 11/19/2015 0435   CO2 27 11/20/2015 0434   CO2 25 11/19/2015 0435   BUN 8 11/20/2015 0434   BUN 14 11/19/2015 0435   CREATININE 0.76 11/20/2015 0434   CREATININE 0.90 11/19/2015 0435   GLUCOSE 99 11/20/2015 0434   GLUCOSE 151* 11/19/2015 0435   CALCIUM 9.1 11/20/2015 0434   CALCIUM 9.0 11/19/2015 0435   AST 25  01/09/2010 1526   AST 26 07/05/2009 1516   ALT 24 01/09/2010 1526   ALT 25 07/05/2009 1516   ALKPHOS 73 01/09/2010 1526   ALKPHOS 84 07/05/2009 1516   BILITOT 0.8 01/09/2010 1526   BILITOT 0.7 07/05/2009 1516   PROT 7.1 01/09/2010 1526   PROT 7.2 07/05/2009 1516   ALBUMIN 4.1 01/09/2010 1526   ALBUMIN 4.2 07/05/2009 1516    Studies/Results: No results found.  Assessment & Plans: Status post robotic sigmoid colectomy for colovaginal fistula, umb hernia repair Advance to full liquid diet Encouraged OOB, ambulation  Earnstine Regal, MD, [facility] Office: [place] 11/20/2015

## 2015-11-21 LAB — BASIC METABOLIC PANEL
ANION GAP: 11 (ref 5–15)
BUN: 6 mg/dL (ref 6–20)
CALCIUM: 9.3 mg/dL (ref 8.9–10.3)
CHLORIDE: 105 mmol/L (ref 101–111)
CO2: 25 mmol/L (ref 22–32)
CREATININE: 0.56 mg/dL (ref 0.44–1.00)
GFR calc non Af Amer: >60 mL/min (ref >60)
Glucose, Bld: 113 mg/dL — ABNORMAL HIGH (ref 65–99)
Potassium: 3.8 mmol/L (ref 3.5–5.1)
Sodium: 141 mmol/L (ref 135–145)

## 2015-11-21 LAB — CBC
HCT: 37.5 % (ref 36.0–46.0)
HEMOGLOBIN: 11.9 g/dL — AB (ref 12.0–15.0)
MCH: 29.6 pg (ref 26.0–34.0)
MCHC: 31.7 g/dL (ref 30.0–36.0)
MCV: 93.3 fL (ref 78.0–100.0)
PLATELETS: 366 10*3/uL (ref 150–400)
RBC: 4.02 MIL/uL (ref 3.87–5.11)
RDW: 16 % — ABNORMAL HIGH (ref 11.5–15.5)
WBC: 9.3 10*3/uL (ref 4.0–10.5)

## 2015-11-21 MED ORDER — HYDROCODONE-ACETAMINOPHEN 5-325 MG PO TABS: 1.0000 | ORAL_TABLET | ORAL | Status: DC | PRN

## 2015-11-21 NOTE — Discharge Instructions (Signed)
ABDOMINAL SURGERY: POST OP INSTRUCTIONS  1. DIET: Follow a light bland diet the first 24 hours after arrival home, such as soup, liquids, crackers, etc.  Be sure to include lots of fluids daily.  Avoid fast food or heavy meals as your are more likely to get nauseated.  Do not eat any uncooked fruits or vegetables for the next 2 weeks as your colon heals. 2. Take your usually prescribed home medications unless otherwise directed. 3. PAIN CONTROL: a. Pain is best controlled by a usual combination of three different methods TOGETHER: i. Ice/Heat ii. Over the counter pain medication iii. Prescription pain medication b. Most patients will experience some swelling and bruising around the incisions.  Ice packs or heating pads (30-60 minutes up to 6 times a day) will help. Use ice for the first few days to help decrease swelling and bruising, then switch to heat to help relax tight/sore spots and speed recovery.  Some people prefer to use ice alone, heat alone, alternating between ice & heat.  Experiment to what works for you.  Swelling and bruising can take several weeks to resolve.   c. It is helpful to take an over-the-counter pain medication regularly for the first few weeks.  Choose one of the following that works best for you: i. Naproxen (Aleve, etc)  Two 220mg  tabs twice a day ii. Ibuprofen (Advil, etc) Three 200mg  tabs four times a day (every meal & bedtime) d. A  prescription for pain medication (such as oxycodone, hydrocodone, etc) should be given to you upon discharge.  Take your pain medication as prescribed.  i. If you are having problems/concerns with the prescription medicine (does not control pain, nausea, vomiting, rash, itching, etc), please call us [phone] to see if we need to switch you to a different pain medicine that will work better for you and/or control your side effect better. ii. If you need a refill on your pain medication, please contact your pharmacy.  They will contact  our office to request authorization. Prescriptions will not be filled after 5 pm or on week-ends. 4. Avoid getting constipated.  Between the surgery and the pain medications, it is common to experience some constipation.  Increasing fluid intake and taking a fiber supplement (such as Metamucil, Citrucel, FiberCon, MiraLax, etc) 1-2 times a day regularly will usually help prevent this problem from occurring.  A mild laxative (prune juice, Milk of Magnesia, MiraLax, etc) should be taken according to package directions if there are no bowel movements after 48 hours.   5. Watch out for diarrhea.  If you have many loose bowel movements, simplify your diet to bland foods & liquids for a few days.  Stop any stool softeners and decrease your fiber supplement.  Switching to mild anti-diarrheal medications (Kayopectate, Pepto Bismol) can help.  If this worsens or does not improve, please call us. 6. Wash / shower every day.  You may shower over the incision / wound.  Avoid baths until the skin is fully healed.  Continue to shower over incision(s) after the dressing is off. 7. Remove your waterproof bandages 5 days after surgery.  You may leave the incision open to air.  You may replace a dressing/Band-Aid to cover the incision for comfort if you wish. 8. ACTIVITIES as tolerated:   a. You may resume regular (light) daily activities beginning the next day--such as daily self-care, walking, climbing stairs--gradually increasing activities as tolerated.  If you can walk 30 minutes without difficulty, it is safe  to try more intense activity such as jogging, treadmill, bicycling, low-impact aerobics, swimming, etc. b. Save the most intensive and strenuous activity for last such as sit-ups, heavy lifting, contact sports, etc  Refrain from any heavy lifting or straining until you are off narcotics for pain control.   c. DO NOT PUSH THROUGH PAIN.  Let pain be your guide: If it hurts to do something, don't do it.  Pain is your  body warning you to avoid that activity for another week until the pain goes down. d. You may drive when you are no longer taking prescription pain medication, you can comfortably wear a seatbelt, and you can safely maneuver your car and apply brakes. e. Dennis Bast may have sexual intercourse when it is comfortable.  9. FOLLOW UP in our office a. Please call [facility] at [phone] to set up an appointment to see your surgeon in the office for a follow-up appointment approximately 1-2 weeks after your surgery. b. Make sure that you call for this appointment the day you arrive home to insure a convenient appointment time. 10. IF YOU HAVE DISABILITY OR FAMILY LEAVE FORMS, BRING THEM TO THE OFFICE FOR PROCESSING.  DO NOT GIVE THEM TO YOUR DOCTOR.   WHEN TO CALL us [phone]: 1. Poor pain control 2. Reactions / problems with new medications (rash/itching, nausea, etc)  3. Fever over 101.5 F (38.5 C) 4. Inability to urinate 5. Nausea and/or vomiting 6. Worsening swelling or bruising 7. Continued bleeding from incision. 8. Increased pain, redness, or drainage from the incision  The clinic staff is available to answer your questions during regular business hours (8:30am-5pm).  Please dont hesitate to call and ask to speak to one of our nurses for clinical concerns.   A surgeon from [facility] is always on call at the hospitals   If you have a medical emergency, go to the nearest emergency room or call 911.    [facility], [place], [place], [place], [place]  [address] ? MAIN: [phone] ? TOLL FREE: [phone] ? FAX [phone] [email or web address]

## 2015-11-21 NOTE — Discharge Summary (Signed)
Physician Discharge Summary  Patient ID: Cassandra [name] MRN: [id number] DOB/AGE: [birth year] 70 y.o.  Admit date: 11/18/2015 Discharge date: 11/21/2015  Admission Diagnoses: Colovaginal fistula  Discharge Diagnoses:  Active Problems:   Colovaginal fistula   Discharged Condition: good  Hospital Course: patient admitted after surgery.  Her diet was advanced as tolerated.  Her foley was removed by POD 2.  She began to have bowel function on POD 2.  By POD 3 she was tolerating a diet and her pain was controlled. She was ambulating well.  It was felt that she was in stable condition for discharge to home.  Consults: None  Significant Diagnostic Studies: labs: cbc, chemistry  Treatments: IV hydration, analgesia: Vicodin and surgery: robotic assisted sigmoidectomy  Discharge Exam: Blood pressure 148/75, pulse 64, temperature 98.1 F (36.7 C), temperature source Oral, resp. rate 15, height 5' (1.524 m), weight 81.647 kg (180 lb), SpO2 97 %. General appearance: alert and cooperative GI: soft, non-distended Incision/Wound: ecchymosis noted around extraction site wound  Disposition: Home     Medication List    STOP taking these medications        amoxicillin-clavulanate 875-125 MG tablet  Commonly known as:  AUGMENTIN     metroNIDAZOLE 500 MG tablet  Commonly known as:  FLAGYL     sulfamethoxazole-trimethoprim 800-160 MG tablet  Commonly known as:  BACTRIM DS,SEPTRA DS      TAKE these medications        atorvastatin 80 MG tablet  Commonly known as:  LIPITOR  Take 80 mg by mouth every evening.     benazepril 20 MG tablet  Commonly known as:  LOTENSIN  Take 20 mg by mouth every morning.     bisoprolol-hydrochlorothiazide 10-6.25 MG tablet  Commonly known as:  ZIAC  Take 1 tablet by mouth every morning.     CALCIUM PO  Take 1 tablet by mouth 2 (two) times daily.     CO Q 10 PO  Take 1 tablet by mouth every morning.     Fish Oil 1000 MG Caps  Take 1,000 mg by  mouth every morning.     HYDROcodone-acetaminophen 5-325 MG tablet  Commonly known as:  NORCO/VICODIN  Take 1-2 tablets by mouth every 4 (four) hours as needed for moderate pain or severe pain.     ibuprofen 200 MG tablet  Commonly known as:  ADVIL,MOTRIN  Take 400 mg by mouth every 6 (six) hours as needed for moderate pain.     levothyroxine 100 MCG tablet  Commonly known as:  SYNTHROID, LEVOTHROID  Take 100 mcg by mouth daily before breakfast.     loratadine 10 MG tablet  Commonly known as:  CLARITIN  Take 10 mg by mouth every morning.     Lysine 500 MG Caps  Take 500 mg by mouth every morning.     multivitamin with minerals Tabs tablet  Take 1 tablet by mouth every morning.     niacin 500 MG tablet  Take 1,500 mg by mouth every morning.     Vitamin D3 2000 units capsule  Take 2,000 Units by mouth every morning.           Follow-up Information    Follow up with Rosario Adie., MD In 3 weeks.   Specialty:  General Surgery   Contact information:   [address] [place]  [address] [phone]       Signed: Rosario Adie [id number], [id number] AM

## 2015-11-30 DIAGNOSIS — I10 Essential (primary) hypertension: Secondary | ICD-10-CM | POA: Diagnosis not present

## 2015-11-30 DIAGNOSIS — E785 Hyperlipidemia, unspecified: Secondary | ICD-10-CM | POA: Diagnosis not present

## 2015-11-30 DIAGNOSIS — Z6834 Body mass index (BMI) 34.0-34.9, adult: Secondary | ICD-10-CM | POA: Diagnosis not present

## 2015-11-30 DIAGNOSIS — E6609 Other obesity due to excess calories: Secondary | ICD-10-CM | POA: Diagnosis not present

## 2015-11-30 DIAGNOSIS — E039 Hypothyroidism, unspecified: Secondary | ICD-10-CM | POA: Diagnosis not present

## 2016-03-05 ENCOUNTER — Other Ambulatory Visit: Payer: Self-pay | Admitting: General Surgery

## 2016-03-05 DIAGNOSIS — K432 Incisional hernia without obstruction or gangrene: Secondary | ICD-10-CM | POA: Diagnosis not present

## 2016-03-05 NOTE — H&P (Signed)
History of Present Illness Leighton Ruff MD; [id number] 4:45 PM) The patient is a 70 year old female who presents with an incisional hernia. 70 year old female who presents to the office for evaluation of an abdominal bulge after robotic resection of sigmoid colon for colovaginal fistula in December 2016. She states that she has noticed this enlarged over the past couple months. She denies any pain. When she lies down the bulge goes away. She is having regular bowel movements and denies any rectal bleeding.   Problem List/Past Medical Leighton Ruff, MD; [id number] 4:45 PM) COLOVAGINAL FISTULA (N82.4) INCISIONAL HERNIA, WITHOUT OBSTRUCTION OR GANGRENE (K43.2)  Other Problems Leighton Ruff, MD; [id number] 4:45 PM) Hypercholesterolemia High blood pressure Thyroid Disease Breast Cancer  Past Surgical History Leighton Ruff, MD; [id number] 4:45 PM) Hernia Repair umbilical Colectomy Breast Mass; Local Excision Right. Sentinel Lymph Node Biopsy Tonsillectomy Breast Biopsy Right.  Diagnostic Studies History Leighton Ruff, MD; [id number] 4:45 PM) Colonoscopy within last year Mammogram within last year Pap Smear 1-5 years ago  Allergies Elbert Ewings, CMA; 03/05/2016 2:49 PM) No Known Drug Allergies 10/17/2015  Medication History Elbert Ewings, CMA; 03/05/2016 2:49 PM) Ibuprofen 200 (200MG  Tablet, Oral as needed) Active. Levothyroxine Sodium (100MCG Tablet, Oral) Active. Atorvastatin Calcium (80MG  Tablet, Oral) Active. Bisoprolol-Hydrochlorothiazide (10-6.25MG  Tablet, Oral) Active. Benazepril HCl (20MG  Tablet, Oral) Active. Medications Reconciled  Social History Leighton Ruff, MD; [id number] 4:45 PM) No drug use Caffeine use Carbonated beverages, Coffee, Tea. Tobacco use Never smoker. Alcohol use Occasional alcohol use.  Family History Leighton Ruff, MD; [id number] 4:45 PM) Arthritis Mother. Prostate Cancer Father. Respiratory Condition Father,  Mother. Migraine Headache Father. Cerebrovascular Accident Brother. Heart Disease Brother. Hypertension Mother.  Pregnancy / Birth History Leighton Ruff, MD; [id number] 4:45 PM) Age at menarche 66 years. Gravida 2 Age of menopause [age] Regular periods Para 0     Review of Systems Leighton Ruff MD; [id number] 4:46 PM) General Not Present- Appetite Loss, Chills, Fatigue, Fever, Night Sweats, Weight Gain and Weight Loss. Skin Not Present- Change in Wart/Mole, Dryness, Hives, Jaundice, New Lesions, Non-Healing Wounds, Rash and Ulcer. HEENT Not Present- Earache, Hearing Loss, Hoarseness, Nose Bleed, Oral Ulcers, Ringing in the Ears, Seasonal Allergies, Sinus Pain, Sore Throat, Visual Disturbances, Wears glasses/contact lenses and Yellow Eyes. Respiratory Not Present- Bloody sputum, Chronic Cough, Difficulty Breathing, Snoring and Wheezing. Breast Not Present- Breast Mass, Breast Pain, Nipple Discharge and Skin Changes. Cardiovascular Not Present- Chest Pain, Difficulty Breathing Lying Down, Leg Cramps, Palpitations, Rapid Heart Rate, Shortness of Breath and Swelling of Extremities. Gastrointestinal Not Present- Abdominal Pain, Bloating, Bloody Stool, Chronic diarrhea, Constipation, Difficulty Swallowing, Excessive gas, Gets full quickly at meals, Hemorrhoids, Indigestion, Nausea, Rectal Pain and Vomiting. Female Genitourinary Present- Frequency. Not Present- Nocturia, Painful Urination, Pelvic Pain and Urgency. Musculoskeletal Present- Joint Pain. Not Present- Back Pain, Joint Stiffness, Muscle Pain, Muscle Weakness and Swelling of Extremities. Neurological Not Present- Decreased Memory, Fainting, Headaches, Numbness, Seizures, Tingling, Tremor, Trouble walking and Weakness. Psychiatric Not Present- Anxiety, Bipolar, Change in Sleep Pattern, Depression, Fearful and Frequent crying. Endocrine Not Present- Cold Intolerance, Excessive Hunger, Hair Changes, Heat Intolerance, Hot flashes  and New Diabetes. Hematology Not Present- Easy Bruising, Excessive bleeding, Gland problems, HIV and Persistent Infections.  Vitals Elbert Ewings CMA; 03/05/2016 2:50 PM) 03/05/2016 2:50 PM Weight: 191 lb Height: 60in Body Surface Area: 1.83 m Body Mass Index: 37.3 kg/m  Temp.: 97.33F  Pulse: 76 (Regular)  BP: 128/82 (Sitting, Left Arm, Standard)      Physical Exam Leighton Ruff MD; [id number] 4:46 PM)  General Mental Status-Alert. General Appearance-Not in acute distress. Build & Nutrition-Well nourished. Posture-Normal posture. Gait-Normal.  Head and Neck Head-normocephalic, atraumatic with no lesions or palpable masses. Trachea-midline.  Chest and Lung Exam Chest and lung exam reveals -on auscultation, normal breath sounds, no adventitious sounds and normal vocal resonance.  Cardiovascular Cardiovascular examination reveals -normal heart sounds, regular rate and rhythm with no murmurs.  Abdomen Inspection  Inspection of the abdomen reveals: Note: Umbilical hernia present, approximately 2 cm fascial defect. Skin - Scar - Right Lower Quadrant. Hernias - Incisional - Reducible. Palpation/Percussion Palpation and Percussion of the abdomen reveal - Soft, Non Tender, No Rigidity (guarding), No hepatosplenomegaly and No Palpable abdominal masses.  Neurologic Neurologic evaluation reveals -alert and oriented x 3 with no impairment of recent or remote memory, normal attention span and ability to concentrate, normal sensation and normal coordination.  Musculoskeletal Normal Exam - Bilateral-Upper Extremity Strength Normal and Lower Extremity Strength Normal.    Assessment & Plan Leighton Ruff MD; [id number] 4:48 PM)  INCISIONAL HERNIA, WITHOUT OBSTRUCTION OR GANGRENE (K43.2) Impression: 70 year old female who presents to the office for evaluation of a new abdominal bulge. On physical exam this is clearly a reducible incisional hernia. We  discussed hernia repair in detail. I recommended a laparoscopic hernia repair with closure and mesh underlay. I offered to have her see one of the surgeons in the office that does more hernia repair volume but she declined. We will plan on performing this surgery in the near future. We discussed the need for overnight observation and possible hospital stay afterwards.

## 2016-03-20 ENCOUNTER — Encounter (HOSPITAL_COMMUNITY): Payer: Self-pay | Admitting: *Deleted

## 2016-04-03 ENCOUNTER — Encounter (HOSPITAL_COMMUNITY)
Admission: RE | Admit: 2016-04-03 | Discharge: 2016-04-03 | Disposition: A | Discharge status: Home / Self Care | Payer: Commercial Managed Care - HMO | Source: Ambulatory Visit | Attending: General Surgery | Admitting: General Surgery

## 2016-04-03 ENCOUNTER — Encounter (HOSPITAL_COMMUNITY): Payer: Self-pay

## 2016-04-03 DIAGNOSIS — E669 Obesity, unspecified: Secondary | ICD-10-CM | POA: Diagnosis not present

## 2016-04-03 DIAGNOSIS — E039 Hypothyroidism, unspecified: Secondary | ICD-10-CM | POA: Diagnosis not present

## 2016-04-03 DIAGNOSIS — K432 Incisional hernia without obstruction or gangrene: Secondary | ICD-10-CM | POA: Diagnosis not present

## 2016-04-03 DIAGNOSIS — I10 Essential (primary) hypertension: Secondary | ICD-10-CM | POA: Diagnosis not present

## 2016-04-03 DIAGNOSIS — K429 Umbilical hernia without obstruction or gangrene: Secondary | ICD-10-CM | POA: Diagnosis not present

## 2016-04-03 DIAGNOSIS — Z79899 Other long term (current) drug therapy: Secondary | ICD-10-CM | POA: Diagnosis not present

## 2016-04-03 DIAGNOSIS — Z6837 Body mass index (BMI) 37.0-37.9, adult: Secondary | ICD-10-CM | POA: Diagnosis not present

## 2016-04-03 DIAGNOSIS — F17200 Nicotine dependence, unspecified, uncomplicated: Secondary | ICD-10-CM | POA: Diagnosis not present

## 2016-04-03 DIAGNOSIS — E7800 Pure hypercholesterolemia, unspecified: Secondary | ICD-10-CM | POA: Diagnosis not present

## 2016-04-03 DIAGNOSIS — Z791 Long term (current) use of non-steroidal anti-inflammatories (NSAID): Secondary | ICD-10-CM | POA: Diagnosis not present

## 2016-04-03 LAB — BASIC METABOLIC PANEL
ANION GAP: 6 (ref 5–15)
BUN: 23 mg/dL — ABNORMAL HIGH (ref 6–20)
CHLORIDE: 105 mmol/L (ref 101–111)
CO2: 28 mmol/L (ref 22–32)
Calcium: 9.8 mg/dL (ref 8.9–10.3)
Creatinine, Ser: 0.8 mg/dL (ref 0.44–1.00)
GFR calc non Af Amer: >60 mL/min (ref >60)
Glucose, Bld: 86 mg/dL (ref 65–99)
POTASSIUM: 4.1 mmol/L (ref 3.5–5.1)
SODIUM: 139 mmol/L (ref 135–145)

## 2016-04-03 LAB — CBC
HCT: 39.3 % (ref 36.0–46.0)
HEMOGLOBIN: 12.9 g/dL (ref 12.0–15.0)
MCH: 30 pg (ref 26.0–34.0)
MCHC: 32.8 g/dL (ref 30.0–36.0)
MCV: 91.4 fL (ref 78.0–100.0)
PLATELETS: 382 10*3/uL (ref 150–400)
RBC: 4.3 MIL/uL (ref 3.87–5.11)
RDW: 14.7 % (ref 11.5–15.5)
WBC: 8.6 10*3/uL (ref 4.0–10.5)

## 2016-04-03 NOTE — Patient Instructions (Addendum)
Cassandra [name]  04/03/2016   Your procedure is scheduled on: Friday 04/06/2016  Report to [facility] Main  Entrance take [name] to 3rd floor to  [name] at  0730  AM.  Call this number if you have problems the morning of surgery [phone]   Remember: ONLY 1 PERSON MAY GO WITH YOU TO SHORT STAY TO GET  READY MORNING OF [name]   Do not eat food or drink liquids :After Midnight.     Take these medicines the morning of surgery with A SIP OF WATER:  Levothyroxine, Loratadine, Ziac                                 You may not have any metal on your body including hair pins and              piercings  Do not wear jewelry, make-up, lotions, powders or perfumes, deodorant             Do not wear nail polish.  Do not shave  48 hours prior to surgery.              Men may shave face and neck.   Do not bring valuables to the hospital. [place].  Contacts, dentures or bridgework may not be worn into surgery.  Leave suitcase in the car. After surgery it may be brought to your room.                  Please read over the following fact sheets you were given: _____________________________________________________________________             [facility] - Preparing for Surgery Before surgery, you can play an important role.  Because skin is not sterile, your skin needs to be as free of germs as possible.  You can reduce the number of germs on your skin by washing with CHG (chlorahexidine gluconate) soap before surgery.  CHG is an antiseptic cleaner which kills germs and bonds with the skin to continue killing germs even after washing. Please DO NOT use if you have an allergy to CHG or antibacterial soaps.  If your skin becomes reddened/irritated stop using the CHG and inform your nurse when you arrive at Short Stay. Do not shave (including legs and underarms) for at least 48 hours prior to the  first CHG shower.  You may shave your face/neck. Please follow these instructions carefully:  1.  Shower with CHG Soap the night before surgery and the  morning of Surgery.  2.  If you choose to wash your hair, wash your hair first as usual with your  normal  shampoo.  3.  After you shampoo, rinse your hair and body thoroughly to remove the  shampoo.                           4.  Use CHG as you would any other liquid soap.  You can apply chg directly  to the skin and wash                       Gently with a scrungie or clean  washcloth.  5.  Apply the CHG Soap to your body ONLY FROM THE NECK DOWN.   Do not use on face/ open                           Wound or open sores. Avoid contact with eyes, ears mouth and genitals (private parts).                       Wash face,  Genitals (private parts) with your normal soap.             6.  Wash thoroughly, paying special attention to the area where your surgery  will be performed.  7.  Thoroughly rinse your body with warm water from the neck down.  8.  DO NOT shower/wash with your normal soap after using and rinsing off  the CHG Soap.                9.  Pat yourself dry with a clean towel.            10.  Wear clean pajamas.            11.  Place clean sheets on your bed the night of your first shower and do not  sleep with pets. Day of Surgery : Do not apply any lotions/deodorants the morning of surgery.  Please wear clean clothes to the hospital/surgery center.  FAILURE TO FOLLOW THESE INSTRUCTIONS MAY RESULT IN THE CANCELLATION OF YOUR SURGERY PATIENT SIGNATURE_________________________________  NURSE SIGNATURE__________________________________  ________________________________________________________________________ [facility] - Preparing for Surgery Before surgery, you can play an important role.  Because skin is not sterile, your skin needs to be as free of germs as possible.  You can reduce the number of germs on your skin by washing with CHG  (chlorahexidine gluconate) soap before surgery.  CHG is an antiseptic cleaner which kills germs and bonds with the skin to continue killing germs even after washing. Please DO NOT use if you have an allergy to CHG or antibacterial soaps.  If your skin becomes reddened/irritated stop using the CHG and inform your nurse when you arrive at Short Stay. Do not shave (including legs and underarms) for at least 48 hours prior to the first CHG shower.  You may shave your face/neck. Please follow these instructions carefully:  1.  Shower with CHG Soap the night before surgery and the  morning of Surgery.  2.  If you choose to wash your hair, wash your hair first as usual with your  normal  shampoo.  3.  After you shampoo, rinse your hair and body thoroughly to remove the  shampoo.                           4.  Use CHG as you would any other liquid soap.  You can apply chg directly  to the skin and wash                       Gently with a scrungie or clean washcloth.  5.  Apply the CHG Soap to your body ONLY FROM THE NECK DOWN.   Do not use on face/ open                           Wound or open sores. Avoid contact with eyes,  ears mouth and genitals (private parts).                       Wash face,  Genitals (private parts) with your normal soap.             6.  Wash thoroughly, paying special attention to the area where your surgery  will be performed.  7.  Thoroughly rinse your body with warm water from the neck down.  8.  DO NOT shower/wash with your normal soap after using and rinsing off  the CHG Soap.                9.  Pat yourself dry with a clean towel.            10.  Wear clean pajamas.            11.  Place clean sheets on your bed the night of your first shower and do not  sleep with pets. Day of Surgery : Do not apply any lotions/deodorants the morning of surgery.  Please wear clean clothes to the hospital/surgery center.  FAILURE TO FOLLOW THESE INSTRUCTIONS MAY RESULT IN THE CANCELLATION OF  YOUR SURGERY PATIENT SIGNATURE_________________________________  NURSE SIGNATURE__________________________________  ________________________________________________________________________

## 2016-04-06 ENCOUNTER — Encounter (HOSPITAL_COMMUNITY): Payer: Self-pay | Admitting: *Deleted

## 2016-04-06 ENCOUNTER — Ambulatory Visit (HOSPITAL_COMMUNITY): Payer: Commercial Managed Care - HMO | Admitting: Anesthesiology

## 2016-04-06 ENCOUNTER — Encounter (HOSPITAL_COMMUNITY)
Admission: AD | Disposition: A | Discharge status: Home / Self Care | Payer: Self-pay | Source: Ambulatory Visit | Attending: General Surgery

## 2016-04-06 ENCOUNTER — Observation Stay (HOSPITAL_COMMUNITY)
Admission: AD | Admit: 2016-04-06 | Discharge: 2016-04-07 | Disposition: A | Discharge status: Home / Self Care | Payer: Commercial Managed Care - HMO | Source: Ambulatory Visit | Attending: General Surgery | Admitting: General Surgery

## 2016-04-06 DIAGNOSIS — K432 Incisional hernia without obstruction or gangrene: Secondary | ICD-10-CM | POA: Diagnosis present

## 2016-04-06 DIAGNOSIS — F17200 Nicotine dependence, unspecified, uncomplicated: Secondary | ICD-10-CM | POA: Diagnosis not present

## 2016-04-06 DIAGNOSIS — I10 Essential (primary) hypertension: Secondary | ICD-10-CM | POA: Insufficient documentation

## 2016-04-06 DIAGNOSIS — K429 Umbilical hernia without obstruction or gangrene: Secondary | ICD-10-CM | POA: Diagnosis not present

## 2016-04-06 DIAGNOSIS — Z79899 Other long term (current) drug therapy: Secondary | ICD-10-CM | POA: Insufficient documentation

## 2016-04-06 DIAGNOSIS — E7800 Pure hypercholesterolemia, unspecified: Secondary | ICD-10-CM | POA: Insufficient documentation

## 2016-04-06 DIAGNOSIS — E669 Obesity, unspecified: Secondary | ICD-10-CM | POA: Diagnosis not present

## 2016-04-06 DIAGNOSIS — E039 Hypothyroidism, unspecified: Secondary | ICD-10-CM | POA: Insufficient documentation

## 2016-04-06 DIAGNOSIS — Z791 Long term (current) use of non-steroidal anti-inflammatories (NSAID): Secondary | ICD-10-CM | POA: Diagnosis not present

## 2016-04-06 DIAGNOSIS — Z6837 Body mass index (BMI) 37.0-37.9, adult: Secondary | ICD-10-CM | POA: Insufficient documentation

## 2016-04-06 HISTORY — PX: INCISIONAL HERNIA REPAIR: SHX193

## 2016-04-06 SURGERY — REPAIR, HERNIA, INCISIONAL, LAPAROSCOPIC
Anesthesia: General | Site: Abdomen

## 2016-04-06 MED ORDER — SUGAMMADEX SODIUM 200 MG/2ML IV SOLN
INTRAVENOUS | Status: AC
  Filled 2016-04-06: qty 2

## 2016-04-06 MED ORDER — KCL IN DEXTROSE-NACL 20-5-0.45 MEQ/L-%-% IV SOLN
INTRAVENOUS | Status: DC
  Administered 2016-04-06: 50 mL/h via INTRAVENOUS
  Filled 2016-04-06 (×2): qty 1000

## 2016-04-06 MED ORDER — SENNOSIDES-DOCUSATE SODIUM 8.6-50 MG PO TABS
1.0000 | ORAL_TABLET | Freq: Every day | ORAL | Status: DC
  Administered 2016-04-06: 1 via ORAL
  Filled 2016-04-06: qty 1

## 2016-04-06 MED ORDER — LYSINE 500 MG PO CAPS: 500.0000 mg | ORAL_CAPSULE | Freq: Every morning | ORAL | Status: DC

## 2016-04-06 MED ORDER — LEVOTHYROXINE SODIUM 100 MCG PO TABS
100.0000 ug | ORAL_TABLET | Freq: Every day | ORAL | Status: DC
  Administered 2016-04-07: 100 ug via ORAL
  Filled 2016-04-06: qty 1

## 2016-04-06 MED ORDER — IBUPROFEN 400 MG PO TABS: 400.0000 mg | ORAL_TABLET | Freq: Four times a day (QID) | ORAL | Status: DC | PRN

## 2016-04-06 MED ORDER — BENAZEPRIL HCL 40 MG PO TABS
40.0000 mg | ORAL_TABLET | Freq: Every day | ORAL | Status: DC
  Administered 2016-04-07: 40 mg via ORAL
  Filled 2016-04-06: qty 1

## 2016-04-06 MED ORDER — PROPOFOL 10 MG/ML IV BOLUS
INTRAVENOUS | Status: AC
  Filled 2016-04-06: qty 20

## 2016-04-06 MED ORDER — METHOCARBAMOL 500 MG PO TABS: 500.0000 mg | ORAL_TABLET | Freq: Four times a day (QID) | ORAL | Status: DC | PRN

## 2016-04-06 MED ORDER — ONDANSETRON 4 MG PO TBDP: 4.0000 mg | ORAL_TABLET | Freq: Four times a day (QID) | ORAL | Status: DC | PRN

## 2016-04-06 MED ORDER — FENTANYL CITRATE (PF) 250 MCG/5ML IJ SOLN
INTRAMUSCULAR | Status: AC
  Filled 2016-04-06: qty 5

## 2016-04-06 MED ORDER — BISACODYL 10 MG RE SUPP: 10.0000 mg | Freq: Every day | RECTAL | Status: DC | PRN

## 2016-04-06 MED ORDER — SCOPOLAMINE 1 MG/3DAYS TD PT72SCOPOLAMINE 1 MG/3DAYS
1.0000 | MEDICATED_PATCH | TRANSDERMAL | Status: DC
Start: 2016-04-06 — End: 2016-04-06
  Administered 2016-04-06: 1.5 mg via TRANSDERMAL

## 2016-04-06 MED ORDER — CEFAZOLIN SODIUM-DEXTROSE 2-4 GM/100ML-% IV SOLN
INTRAVENOUS | Status: AC
  Filled 2016-04-06: qty 100

## 2016-04-06 MED ORDER — FENTANYL CITRATE (PF) 100 MCG/2ML IJ SOLN
INTRAMUSCULAR | Status: AC
  Filled 2016-04-06: qty 2

## 2016-04-06 MED ORDER — LACTATED RINGERS IV SOLN
INTRAVENOUS | Status: DC
  Administered 2016-04-06: 1000 mL via INTRAVENOUS
  Administered 2016-04-06: 11:00:00 via INTRAVENOUS

## 2016-04-06 MED ORDER — MORPHINE SULFATE (PF) 2 MG/ML IV SOLN: 2.0000 mg | INTRAVENOUS | Status: DC | PRN

## 2016-04-06 MED ORDER — SUCCINYLCHOLINE CHLORIDE 20 MG/ML IJ SOLN
INTRAMUSCULAR | Status: DC | PRN
  Administered 2016-04-06: 100 mg via INTRAVENOUS

## 2016-04-06 MED ORDER — VITAMIN D3 25 MCG (1000 UNIT) PO TABS
2000.0000 [IU] | ORAL_TABLET | Freq: Every morning | ORAL | Status: DC
  Administered 2016-04-07: 2000 [IU] via ORAL
  Filled 2016-04-06: qty 2

## 2016-04-06 MED ORDER — BISOPROLOL-HYDROCHLOROTHIAZIDE 10-6.25 MG PO TABS
1.0000 | ORAL_TABLET | Freq: Every morning | ORAL | Status: DC
  Administered 2016-04-07: 1 via ORAL
  Filled 2016-04-06: qty 1

## 2016-04-06 MED ORDER — SIMETHICONE 80 MG PO CHEW: 40.0000 mg | CHEWABLE_TABLET | Freq: Four times a day (QID) | ORAL | Status: DC | PRN

## 2016-04-06 MED ORDER — LIDOCAINE HCL (CARDIAC) 20 MG/ML IV SOLN
INTRAVENOUS | Status: DC | PRN
  Administered 2016-04-06: 100 mg via INTRAVENOUS

## 2016-04-06 MED ORDER — ONDANSETRON HCL 4 MG/2ML IJ SOLN
INTRAMUSCULAR | Status: DC | PRN
  Administered 2016-04-06: 4 mg via INTRAVENOUS

## 2016-04-06 MED ORDER — ROCURONIUM BROMIDE 100 MG/10ML IV SOLN
INTRAVENOUS | Status: DC | PRN
  Administered 2016-04-06: 40 mg via INTRAVENOUS
  Administered 2016-04-06 (×3): 10 mg via INTRAVENOUS

## 2016-04-06 MED ORDER — SUGAMMADEX SODIUM 200 MG/2ML IV SOLN
INTRAVENOUS | Status: DC | PRN
  Administered 2016-04-06: 200 mg via INTRAVENOUS

## 2016-04-06 MED ORDER — BUPIVACAINE-EPINEPHRINE 0.25% -1:200000 IJ SOLN
INTRAMUSCULAR | Status: AC
  Filled 2016-04-06: qty 1

## 2016-04-06 MED ORDER — HYDROCODONE-ACETAMINOPHEN 5-325 MG PO TABS
1.0000 | ORAL_TABLET | ORAL | Status: DC | PRN
  Administered 2016-04-06 – 2016-04-07 (×4): 1 via ORAL
  Filled 2016-04-06 (×4): qty 1

## 2016-04-06 MED ORDER — CEFAZOLIN SODIUM-DEXTROSE 2-4 GM/100ML-% IV SOLN
2.0000 g | Freq: Three times a day (TID) | INTRAVENOUS | Status: AC
Start: 2016-04-06 — End: 2016-04-06
  Administered 2016-04-06: 2 g via INTRAVENOUS
  Filled 2016-04-06: qty 100

## 2016-04-06 MED ORDER — NIACIN 500 MG PO TABS: 1500.0000 mg | ORAL_TABLET | Freq: Every morning | ORAL | Status: DC

## 2016-04-06 MED ORDER — PROMETHAZINE HCL 25 MG/ML IJ SOLN: 6.2500 mg | INTRAMUSCULAR | Status: DC | PRN

## 2016-04-06 MED ORDER — MIDAZOLAM HCL 2 MG/2ML IJ SOLN
INTRAMUSCULAR | Status: AC
  Filled 2016-04-06: qty 2

## 2016-04-06 MED ORDER — DIPHENHYDRAMINE HCL 50 MG/ML IJ SOLN: 12.5000 mg | Freq: Four times a day (QID) | INTRAMUSCULAR | Status: DC | PRN

## 2016-04-06 MED ORDER — DIPHENHYDRAMINE HCL 12.5 MG/5ML PO ELIX: 12.5000 mg | ORAL_SOLUTION | Freq: Four times a day (QID) | ORAL | Status: DC | PRN

## 2016-04-06 MED ORDER — ONDANSETRON HCL 4 MG/2ML IJ SOLN
INTRAMUSCULAR | Status: AC
  Filled 2016-04-06: qty 2

## 2016-04-06 MED ORDER — ONDANSETRON HCL 4 MG/2ML IJ SOLN: 4.0000 mg | Freq: Four times a day (QID) | INTRAMUSCULAR | Status: DC | PRN

## 2016-04-06 MED ORDER — CEFAZOLIN SODIUM-DEXTROSE 2-4 GM/100ML-% IV SOLN
2.0000 g | INTRAVENOUS | Status: AC
  Administered 2016-04-06: 2 g via INTRAVENOUS

## 2016-04-06 MED ORDER — PROPOFOL 10 MG/ML IV BOLUS
INTRAVENOUS | Status: DC | PRN
  Administered 2016-04-06: 170 mg via INTRAVENOUS

## 2016-04-06 MED ORDER — DEXAMETHASONE SODIUM PHOSPHATE 10 MG/ML IJ SOLN
INTRAMUSCULAR | Status: DC | PRN
  Administered 2016-04-06: 10 mg via INTRAVENOUS

## 2016-04-06 MED ORDER — FENTANYL CITRATE (PF) 100 MCG/2ML IJ SOLN
INTRAMUSCULAR | Status: DC | PRN
  Administered 2016-04-06 (×2): 50 ug via INTRAVENOUS
  Administered 2016-04-06: 100 ug via INTRAVENOUS
  Administered 2016-04-06 (×2): 50 ug via INTRAVENOUS

## 2016-04-06 MED ORDER — BUPIVACAINE-EPINEPHRINE 0.25% -1:200000 IJ SOLN
INTRAMUSCULAR | Status: DC | PRN
  Administered 2016-04-06: 50 mL

## 2016-04-06 MED ORDER — ENOXAPARIN SODIUM 40 MG/0.4ML ~~LOC~~ SOLN
40.0000 mg | SUBCUTANEOUS | Status: DC
  Administered 2016-04-07: 40 mg via SUBCUTANEOUS
  Filled 2016-04-06: qty 0.4

## 2016-04-06 MED ORDER — SCOPOLAMINE 1 MG/3DAYS TD PT72
MEDICATED_PATCH | TRANSDERMAL | Status: AC
  Filled 2016-04-06: qty 1

## 2016-04-06 MED ORDER — LIDOCAINE HCL (CARDIAC) 20 MG/ML IV SOLN
INTRAVENOUS | Status: AC
  Filled 2016-04-06: qty 5

## 2016-04-06 MED ORDER — ATORVASTATIN CALCIUM 20 MG PO TABS
80.0000 mg | ORAL_TABLET | Freq: Every evening | ORAL | Status: DC
  Administered 2016-04-06: 80 mg via ORAL
  Filled 2016-04-06: qty 4

## 2016-04-06 MED ORDER — MIDAZOLAM HCL 5 MG/5ML IJ SOLN
INTRAMUSCULAR | Status: DC | PRN
  Administered 2016-04-06: 2 mg via INTRAVENOUS

## 2016-04-06 MED ORDER — HYDROMORPHONE HCL 1 MG/ML IJ SOLN: 0.2500 mg | INTRAMUSCULAR | Status: DC | PRN

## 2016-04-06 MED ORDER — DEXAMETHASONE SODIUM PHOSPHATE 10 MG/ML IJ SOLN
INTRAMUSCULAR | Status: AC
  Filled 2016-04-06: qty 1

## 2016-04-06 MED ORDER — NIACIN ER 500 MG PO TBCR
1500.0000 mg | EXTENDED_RELEASE_TABLET | Freq: Every day | ORAL | Status: DC
  Administered 2016-04-06 – 2016-04-07 (×2): 1500 mg via ORAL
  Filled 2016-04-06 (×2): qty 3

## 2016-04-06 MED ORDER — 0.9 % SODIUM CHLORIDE (POUR BTL) OPTIME
TOPICAL | Status: DC | PRN
  Administered 2016-04-06: 1000 mL

## 2016-04-06 MED ORDER — LORATADINE 10 MG PO TABS
10.0000 mg | ORAL_TABLET | Freq: Every morning | ORAL | Status: DC
  Administered 2016-04-07: 10 mg via ORAL
  Filled 2016-04-06: qty 1

## 2016-04-06 SURGICAL SUPPLY — 44 items
BINDER ABDOMINAL 12 ML 46-62 (SOFTGOODS) ×2 IMPLANT
CHLORAPREP W/TINT 26ML (MISCELLANEOUS) ×3 IMPLANT
COVER SURGICAL LIGHT HANDLE (MISCELLANEOUS) ×3 IMPLANT
DECANTER SPIKE VIAL GLASS SM (MISCELLANEOUS) ×3 IMPLANT
DEVICE SECURE STRAP 25 ABSORB (INSTRUMENTS) ×4 IMPLANT
DEVICE TROCAR PUNCTURE CLOSURE (ENDOMECHANICALS) ×3 IMPLANT
DRAPE LAPAROSCOPIC ABDOMINAL (DRAPES) ×3 IMPLANT
ELECT PENCIL ROCKER SW 15FT (MISCELLANEOUS) ×2 IMPLANT
ELECT REM PT RETURN 9FT ADLT (ELECTROSURGICAL) ×3
ELECTRODE REM PT RTRN 9FT ADLT (ELECTROSURGICAL) ×1 IMPLANT
GLOVE BIO SURGEON STRL SZ 6.5 (GLOVE) ×2 IMPLANT
GLOVE BIO SURGEONS STRL SZ 6.5 (GLOVE) ×1
GLOVE BIOGEL PI IND STRL 7.0 (GLOVE) ×1 IMPLANT
GLOVE BIOGEL PI INDICATOR 7.0 (GLOVE) ×2
GOWN STRL REUS W/TWL 2XL LVL3 (GOWN DISPOSABLE) ×3 IMPLANT
GOWN STRL REUS W/TWL XL LVL3 (GOWN DISPOSABLE) ×6 IMPLANT
KIT BASIN OR (CUSTOM PROCEDURE TRAY) ×3 IMPLANT
LIQUID BAND (GAUZE/BANDAGES/DRESSINGS) ×3 IMPLANT
MARKER SKIN DUAL TIP RULER LAB (MISCELLANEOUS) ×5 IMPLANT
MESH VENTRALIGHT ST 4X6IN (Mesh General) ×2 IMPLANT
NDL INSUFFLATION 14GA 120MM (NEEDLE) IMPLANT
NDL SPNL 22GX3.5 QUINCKE BK (NEEDLE) ×1 IMPLANT
NEEDLE INSUFFLATION 14GA 120MM (NEEDLE) IMPLANT
NEEDLE SPNL 22GX3.5 QUINCKE BK (NEEDLE) ×3 IMPLANT
PAD POSITIONING PINK XL (MISCELLANEOUS) ×1 IMPLANT
POSITIONER SURGICAL ARM (MISCELLANEOUS) ×3 IMPLANT
SCISSORS LAP 5X35 DISP (ENDOMECHANICALS) ×3 IMPLANT
SET IRRIG TUBING LAPAROSCOPIC (IRRIGATION / IRRIGATOR) IMPLANT
SLEEVE XCEL OPT CAN 5 100 (ENDOMECHANICALS) ×3 IMPLANT
SUT NOVA NAB GS-21 0 18 T12 DT (SUTURE) ×4 IMPLANT
SUT PDS AB 0 CT1 36 (SUTURE) ×2 IMPLANT
SUT PROLENE 0 CT 2 (SUTURE) ×10 IMPLANT
SUT VIC AB 2-0 SH 18 (SUTURE) ×2 IMPLANT
SUT VIC AB 4-0 PS2 18 (SUTURE) ×5 IMPLANT
TOWEL OR 17X26 10 PK STRL BLUE (TOWEL DISPOSABLE) ×3 IMPLANT
TOWEL OR NON WOVEN STRL DISP B (DISPOSABLE) ×3 IMPLANT
TRAY FOLEY W/METER SILVER 14FR (SET/KITS/TRAYS/PACK) ×2 IMPLANT
TRAY FOLEY W/METER SILVER 16FR (SET/KITS/TRAYS/PACK) IMPLANT
TRAY LAPAROSCOPIC (CUSTOM PROCEDURE TRAY) ×3 IMPLANT
TROCAR BLADELESS OPT 5 100 (ENDOMECHANICALS) ×3 IMPLANT
TROCAR XCEL 12X100 BLDLESS (ENDOMECHANICALS) ×1 IMPLANT
TROCAR XCEL BLUNT TIP 100MML (ENDOMECHANICALS) IMPLANT
TROCAR XCEL NON-BLD 11X100MML (ENDOMECHANICALS) IMPLANT
TUBING INSUF HEATED (TUBING) ×5 IMPLANT

## 2016-04-06 NOTE — Anesthesia Procedure Notes (Signed)
Procedure Name: Intubation Date/Time: 04/06/2016 9:20 AM Performed by: Lind Covert Pre-anesthesia Checklist: Patient identified, Emergency Drugs available, Suction available, Patient being monitored and Timeout performed Patient Re-evaluated:Patient Re-evaluated prior to inductionOxygen Delivery Method: Circle system utilized Preoxygenation: Pre-oxygenation with 100% oxygen Intubation Type: IV induction Ventilation: Mask ventilation without difficulty Laryngoscope Size: Mac and 4 Grade View: Grade I Tube type: Oral Tube size: 7.0 mm Number of attempts: 1 Airway Equipment and Method: Stylet Placement Confirmation: ETT inserted through vocal cords under direct vision,  positive ETCO2 and breath sounds checked- equal and bilateral Secured at: 22 cm Tube secured with: Tape Dental Injury: Teeth and Oropharynx as per pre-operative assessment

## 2016-04-06 NOTE — Op Note (Signed)
04/06/2016  11:44 AM  PATIENT:  Cassandra [name]  70 y.o. female  Patient Care Team: Harlan Stains, MD as PCP - General (Family Medicine)  PRE-OPERATIVE DIAGNOSIS:  Incisional hernia  POST-OPERATIVE DIAGNOSIS:  Incisional hernia and umbilical hernia  PROCEDURE:   LAPAROSCOPIC ASSISTED INCISIONAL HERNIA REPAIR WITH MESH   Surgeon(s): Leighton Ruff, MD  ASSISTANT: none   ANESTHESIA:   local and general  EBL:  Total I/O In: 1000 [I.V.:1000] Out: 270 [Urine:250; Blood:20]  Delay start of Pharmacological VTE agent (>24hrs) due to surgical blood loss or risk of bleeding:  no  DRAINS: none   SPECIMEN:  Source of Specimen:  hernia sac  DISPOSITION OF SPECIMEN:  PATHOLOGY  COUNTS:  YES  PLAN OF CARE: Admit for overnight observation  PATIENT DISPOSITION:  PACU - hemodynamically stable.  INDICATION: Pleasant patient has developed a ventral wall abdominal hernia.   Recommendation was made for surgical repair:  The anatomy & physiology of the abdominal wall was discussed. The pathophysiology of hernias was discussed. Natural history risks without surgery including progeressive enlargement, pain, incarceration & strangulation was discussed. Contributors to complications such as smoking, obesity, diabetes, prior surgery, etc were discussed.  I feel the risks of no intervention will lead to serious problems that outweigh the operative risks; therefore, I recommended surgery to reduce and repair the hernia. I explained laparoscopic techniques with possible need for an open approach. I noted the probable use of mesh to patch and/or buttress the hernia repair  Risks such as bleeding, infection, abscess, need for further treatment, heart attack, death, and other risks were discussed. I noted a good likelihood this will help address the problem. Goals of post-operative recovery were discussed as well. Possibility that this will not correct all symptoms was explained. I stressed the importance  of low-impact activity, aggressive pain control, avoiding constipation, & not pushing through pain to minimize risk of post-operative chronic pain or injury. Possibility of reherniation especially with smoking, obesity, diabetes, immunosuppression, and other health conditions was discussed. We will work to minimize complications.  An educational handout further explaining the pathology & treatment options was given as well. Questions were answered. The patient expresses understanding & wishes to proceed with surgery.   OR FINDINGS: 8x5cm incisional hernia, 1cm umbilical hernia   Type of repair - Laparoscopic underlay repair   Name of mesh -  Bard Ventralight dual sided (polypropylene / Seprafilm)  Size of mesh - Length 10 cm, Width 15 cm  Mesh overlap - 3 cm  Placement of mesh - Intraperitoneal underlay repair   DESCRIPTION:   Informed consent was confirmed. The patient underwent general anaesthesia without difficulty. The patient was positioned appropriately. VTE prevention in place. The patient's abdomen was clipped, prepped, & draped in a sterile fashion. Surgical timeout confirmed our plan.   I opened her old incision with a scalpel.  I reduced the hernia and removed the hernia sac.  I cleared down to the level of the fascia.    I could see the hernia in the RLQ.  I measured the hernia to be ~8cm long x 5cm wide.  I selected a 10x15cm oval mesh.  I placed #1 Novofil stitches around its edge about every 5 cm = 8 total.  I rolled the mesh & placed into the peritoneal cavity through the hernia defect.  I unrolled the mesh and positioned it appropriately.  I then closed the hernia with interrupted 0 Novofil sutures.  I placed a 28mm port through the incision  and insufflated the abdomen.  I placed 2 more 27mm ports in the left upper and lower quadrants.  I removed the port in the incision and closed the hole with the remaining suture.  I evaluated the abdomen.  Hemostasis was good.  There was a  1cm umbilical hernia noted just lateral the the defect.  I felt that the mash would cover this as well.  It was closed with the suture passer and 2 0 Novafil sutures.     I secured the mesh to cover up the hernia defect using a laparoscopic suture passer to pass the tails of the sutures through the abdominal wall & tagged them with clamps.  I started out in four corners to make sure I had the mesh centered over the hernia defect appropriately, and then proceeded to work in quadrants.  We evacuated CO2 & desufflated the abdomen.  I tied the fascial stitches down.  I reinsufflated the abdomen.   I tacked the edges & central part of the mesh to the peritoneum/posterior rectus fascia with SecureStrap absorbable tacks.   Hemostasis was excellent.  Mesh laid well. Capnoperitoneum was evacuated. Ports were removed. The skin was closed with 4-0 Vicryl at the port sites and Dermabond on the fascial stitch puncture sites. The hernia subcutaneous tissue was closed with interrupted 2-0 Vicryl sutures.  The skin was closed with a 4-0 Vicryl running subcuticular suture.  Dermabond was placed over this.  An abdominal binder was also placed.  The patient was awakened from anesthesia and sent to the PACU in stable condition.  All counts were correct per OR staff.

## 2016-04-06 NOTE — Discharge Instructions (Signed)
HERNIA REPAIR: POST OP INSTRUCTIONS ° °1. DIET: Follow a light bland diet the first 24 hours after arrival home, such as soup, liquids, crackers, etc.  Be sure to include lots of fluids daily.  Avoid fast food or heavy meals as your are more likely to get nauseated.  Eat a low fat the next few days after surgery. °2. Take your usually prescribed home medications unless otherwise directed. °3. PAIN CONTROL: °a. Pain is best controlled by a usual combination of three different methods TOGETHER: °i. Ice/Heat °ii. Over the counter pain medication °iii. Prescription pain medication °b. Most patients will experience some swelling and bruising around the hernia(s) such as the bellybutton, groins, or old incisions.  Ice packs or heating pads (30-60 minutes up to 6 times a day) will help. Use ice for the first few days to help decrease swelling and bruising, then switch to heat to help relax tight/sore spots and speed recovery.  Some people prefer to use ice alone, heat alone, alternating between ice & heat.  Experiment to what works for you.  Swelling and bruising can take several weeks to resolve.   °c. It is helpful to take an over-the-counter pain medication regularly for the first few weeks.  Choose one of the following that works best for you: °i. Naproxen (Aleve, etc)  Two 220mg tabs twice a day °ii. Ibuprofen (Advil, etc) Three 200mg tabs four times a day (every meal & bedtime) °d. A  prescription for pain medication should be given to you upon discharge.  Take your pain medication as prescribed.  °i. If you are having problems/concerns with the prescription medicine (does not control pain, nausea, vomiting, rash, itching, etc), please call us [phone] to see if we need to switch you to a different pain medicine that will work better for you and/or control your side effect better. °ii. If you need a refill on your pain medication, please contact your pharmacy.  They will contact our office to request  authorization. Prescriptions will not be filled after 5 pm or on week-ends. °4. Avoid getting constipated.  Between the surgery and the pain medications, it is common to experience some constipation.  Increasing fluid intake and taking a fiber supplement (such as Metamucil, Citrucel, FiberCon, MiraLax, etc) 1-2 times a day regularly will usually help prevent this problem from occurring.  A mild laxative (prune juice, Milk of Magnesia, MiraLax, etc) should be taken according to package directions if there are no bowel movements after 48 hours.   °5. Wash / shower every day.  You may shower over the dressings as they are waterproof.   °6. Remove your waterproof bandages 5 days after surgery.  You may leave the incision open to air.  You may replace a dressing/Band-Aid to cover the incision for comfort if you wish.  Continue to shower over incision(s) after the dressing is off. ° ° ° °7. ACTIVITIES as tolerated:   °a. You may resume regular (light) daily activities beginning the next day--such as daily self-care, walking, climbing stairs--gradually increasing activities as tolerated.  If you can walk 30 minutes without difficulty, it is safe to try more intense activity such as jogging, treadmill, bicycling, low-impact aerobics, swimming, etc. °b. Save the most intensive and strenuous activity for last such as sit-ups, heavy lifting, contact sports, etc  Refrain from any heavy lifting or straining until you are off narcotics for pain control.   °c. DO NOT PUSH THROUGH PAIN.  Let pain be your guide: If   it hurts to do something, don't do it.  Pain is your body warning you to avoid that activity for another week until the pain goes down. °d. You may drive when you are no longer taking prescription pain medication, you can comfortably wear a seatbelt, and you can safely maneuver your car and apply brakes. °e. You may have sexual intercourse when it is comfortable.  °8. FOLLOW UP in our office °a. Please call [facility] at [phone]  [phone] to set up an appointment to see your surgeon in the office for a follow-up appointment approximately 2-3 weeks after your surgery. °b. Make sure that you call for this appointment the day you arrive home to insure a convenient appointment time. °9.  IF YOU HAVE DISABILITY OR FAMILY LEAVE FORMS, BRING THEM TO THE OFFICE FOR PROCESSING.  DO NOT GIVE THEM TO YOUR DOCTOR. ° °WHEN TO CALL US [phone]: °1. Poor pain control °2. Reactions / problems with new medications (rash/itching, nausea, etc)  °3. Fever over 101.5 F (38.5 C) °4. Inability to urinate °5. Nausea and/or vomiting °6. Worsening swelling or bruising °7. Continued bleeding from incision. °8. Increased pain, redness, or drainage from the incision ° ° The clinic staff is available to answer your questions during regular business hours (8:30am-5pm).  Please don’t hesitate to call and ask to speak to one of our nurses for clinical concerns.  ° If you have a medical emergency, go to the nearest emergency room or call 911. ° A surgeon from [facility] is always on call at the hospitals in [place] ° °[facility] °[address], [address], [place], [place]  [address] ? ° [address] [address], [place], [place]   [address] °MAIN: [phone] ? TOLL FREE: [phone] ? FAX: [phone] °[email or web address] ° °

## 2016-04-06 NOTE — Transfer of Care (Signed)
Immediate Anesthesia Transfer of Care Note  Patient: Cassandra [name]  Procedure(s) Performed: Procedure(s): LAPAROSCOPIC ASSISTED INCISIONAL HERNIA REPAIR WITH MESH (N/A)  Patient Location: PACU  Anesthesia Type:General  Level of Consciousness: sedated  Airway & Oxygen Therapy: Patient Spontanous Breathing and Patient connected to face mask oxygen  Post-op Assessment: Report given to RN and Post -op Vital signs reviewed and stable  Post vital signs: Reviewed and stable  Last Vitals:  Filed Vitals:   04/06/16 0742  BP: 150/77  Pulse: 69  Temp: 36.4 C  Resp: 18    Last Pain: There were no vitals filed for this visit.    Patients Stated Pain Goal: 3 ([id number])  Complications: No apparent anesthesia complications

## 2016-04-06 NOTE — H&P (Signed)
The patient is a 70 year old female who presents with an incisional hernia. 70 year old female who presents to the office for evaluation of an abdominal bulge after robotic resection of sigmoid colon for colovaginal fistula in December 2016. She states that she has noticed this enlarged over the past couple months. She denies any pain. When she lies down the bulge goes away. She is having regular bowel movements and denies any rectal bleeding.   Problem List/Past Medical Leighton Ruff, MD; [id number] 4:45 PM) COLOVAGINAL FISTULA (N82.4) INCISIONAL HERNIA, WITHOUT OBSTRUCTION OR GANGRENE (K43.2)  Other Problems Leighton Ruff, MD; [id number] 4:45 PM) Hypercholesterolemia High blood pressure Thyroid Disease Breast Cancer  Past Surgical History Leighton Ruff, MD; [id number] 4:45 PM) Hernia Repair umbilical Colectomy Breast Mass; Local Excision Right. Sentinel Lymph Node Biopsy Tonsillectomy Breast Biopsy Right.  Diagnostic Studies History Leighton Ruff, MD; [id number] 4:45 PM) Colonoscopy within last year Mammogram within last year Pap Smear 1-5 years ago  Allergies Elbert Ewings, CMA; 03/05/2016 2:49 PM) No Known Drug Allergies 10/17/2015  Medication History Elbert Ewings, CMA; 03/05/2016 2:49 PM) Ibuprofen 200 (200MG  Tablet, Oral as needed) Active. Levothyroxine Sodium (100MCG Tablet, Oral) Active. Atorvastatin Calcium (80MG  Tablet, Oral) Active. Bisoprolol-Hydrochlorothiazide (10-6.25MG  Tablet, Oral) Active. Benazepril HCl (20MG  Tablet, Oral) Active. Medications Reconciled  Social History Leighton Ruff, MD; [id number] 4:45 PM) No drug use Caffeine use Carbonated beverages, Coffee, Tea. Tobacco use Never smoker. Alcohol use Occasional alcohol use.  Family History Leighton Ruff, MD; [id number] 4:45 PM) Arthritis Mother. Prostate Cancer Father. Respiratory Condition Father, Mother. Migraine Headache Father. Cerebrovascular Accident  Brother. Heart Disease Brother. Hypertension Mother.  Pregnancy / Birth History Leighton Ruff, MD; [id number] 4:45 PM) Age at menarche 6 years. Gravida 2 Age of menopause [age] Regular periods Para 0     Review of Systems  General Not Present- Appetite Loss, Chills, Fatigue, Fever, Night Sweats, Weight Gain and Weight Loss. Skin Not Present- Change in Wart/Mole, Dryness, Hives, Jaundice, New Lesions, Non-Healing Wounds, Rash and Ulcer. HEENT Not Present- Earache, Hearing Loss, Hoarseness, Nose Bleed, Oral Ulcers, Ringing in the Ears, Seasonal Allergies, Sinus Pain, Sore Throat, Visual Disturbances, Wears glasses/contact lenses and Yellow Eyes. Respiratory Not Present- Bloody sputum, Chronic Cough, Difficulty Breathing, Snoring and Wheezing. Breast Not Present- Breast Mass, Breast Pain, Nipple Discharge and Skin Changes. Cardiovascular Not Present- Chest Pain, Difficulty Breathing Lying Down, Leg Cramps, Palpitations, Rapid Heart Rate, Shortness of Breath and Swelling of Extremities. Gastrointestinal Not Present- Abdominal Pain, Bloating, Bloody Stool, Chronic diarrhea, Constipation, Difficulty Swallowing, Excessive gas, Gets full quickly at meals, Hemorrhoids, Indigestion, Nausea, Rectal Pain and Vomiting. Female Genitourinary Present- Frequency. Not Present- Nocturia, Painful Urination, Pelvic Pain and Urgency. Musculoskeletal Present- Joint Pain. Not Present- Back Pain, Joint Stiffness, Muscle Pain, Muscle Weakness and Swelling of Extremities. Neurological Not Present- Decreased Memory, Fainting, Headaches, Numbness, Seizures, Tingling, Tremor, Trouble walking and Weakness. Psychiatric Not Present- Anxiety, Bipolar, Change in Sleep Pattern, Depression, Fearful and Frequent crying. Endocrine Not Present- Cold Intolerance, Excessive Hunger, Hair Changes, Heat Intolerance, Hot flashes and New Diabetes. Hematology Not Present- Easy Bruising, Excessive bleeding, Gland problems, HIV  and Persistent Infections.  BP 150/77 mmHg  Pulse 69  Temp(Src) 97.5 F (36.4 C) (Oral)  Resp 18  Ht 5' (1.524 m)  Wt 87.998 kg (194 lb)  BMI 37.89 kg/m2  SpO2 97%   Physical Exam  General Mental Status-Alert. General Appearance-Not in acute distress. Build & Nutrition-Well nourished. Posture-Normal posture. Gait-Normal.  Head and Neck Head-normocephalic, atraumatic with no lesions or palpable masses. Trachea-midline.  Chest and Lung Exam Chest and lung exam reveals -on auscultation, normal breath sounds, no adventitious sounds and normal vocal resonance.  Cardiovascular Cardiovascular examination reveals -normal heart sounds, regular rate and rhythm with no murmurs.  Abdomen Inspection  Inspection of the abdomen reveals: Note: Umbilical hernia present, approximately 2 cm fascial defect. Skin - Scar - Right Lower Quadrant. Hernias - Incisional - Reducible. Palpation/Percussion Palpation and Percussion of the abdomen reveal - Soft, Non Tender, No Rigidity (guarding), No hepatosplenomegaly and No Palpable abdominal masses.  Neurologic Neurologic evaluation reveals -alert and oriented x 3 with no impairment of recent or remote memory, normal attention span and ability to concentrate, normal sensation and normal coordination.  Musculoskeletal Normal Exam - Bilateral-Upper Extremity Strength Normal and Lower Extremity Strength Normal.    Assessment & Plan  INCISIONAL HERNIA, WITHOUT OBSTRUCTION OR GANGRENE (K43.2) Impression: 71 year old female who presents to the office for evaluation of a new abdominal bulge. On physical exam this is clearly a reducible incisional hernia. We discussed hernia repair in detail. I recommended a laparoscopic hernia repair with closure and mesh underlay. I offered to have her see one of the surgeons in the office that does more hernia repair volume but she declined. We will plan on performing this surgery in the near  future. We discussed the need for overnight observation and possible hospital stay afterwards.

## 2016-04-06 NOTE — Anesthesia Preprocedure Evaluation (Addendum)
Anesthesia Evaluation  Patient identified by MRN, date of birth, ID band Patient awake    Reviewed: Allergy & Precautions, NPO status , Patient's Chart, lab work & pertinent test results, reviewed documented beta blocker date and time   Airway Mallampati: II   Neck ROM: full    Dental no notable dental hx. (+) Dental Advisory Given   Pulmonary Current Smoker,    Pulmonary exam normal        Cardiovascular hypertension, Pt. on medications and Pt. on home beta blockers Normal cardiovascular exam     Neuro/Psych    GI/Hepatic   Endo/Other  Hypothyroidism obese  Renal/GU      Musculoskeletal   Abdominal   Peds  Hematology   Anesthesia Other Findings   Reproductive/Obstetrics                            Anesthesia Physical  Anesthesia Plan  ASA: II  Anesthesia Plan: General   Post-op Pain Management:    Induction: Intravenous  Airway Management Planned: Oral ETT  Additional Equipment:   Intra-op Plan:   Post-operative Plan: Extubation in OR  Informed Consent: I have reviewed the patients History and Physical, chart, labs and discussed the procedure including the risks, benefits and alternatives for the proposed anesthesia with the patient or authorized representative who has indicated his/her understanding and acceptance.   Dental advisory given  Plan Discussed with: Anesthesiologist  Anesthesia Plan Comments:        Anesthesia Quick Evaluation

## 2016-04-06 NOTE — Progress Notes (Signed)
PHARMACIST - PHYSICIAN ORDER COMMUNICATION  CONCERNING: P&T Medication Policy on Herbal Medications  DESCRIPTION:  This patient's order for:  Lysine has been noted.  This product(s) is classified as an "herbal" or natural product. Due to a lack of definitive safety studies or FDA approval, nonstandard manufacturing practices, plus the potential risk of unknown drug-drug interactions while on inpatient medications, the Pharmacy and Therapeutics Committee does not permit the use of "herbal" or natural products of this type within [facility].   ACTION TAKEN: The pharmacy department is unable to verify this order at this time and your patient has been informed of this safety policy. Please reevaluate patient's clinical condition at discharge and address if the herbal or natural product(s) should be resumed at that time.  Ralene Bathe, PharmD, BCPS 04/06/2016, 1:28 PM  Pager: [phone]

## 2016-04-06 NOTE — Anesthesia Postprocedure Evaluation (Signed)
Anesthesia Post Note  Patient: Cassandra [name]  Procedure(s) Performed: Procedure(s) (LRB): LAPAROSCOPIC ASSISTED INCISIONAL HERNIA REPAIR WITH MESH (N/A)  Patient location during evaluation: PACU Anesthesia Type: General Level of consciousness: sedated Pain management: pain level controlled Vital Signs Assessment: post-procedure vital signs reviewed and stable Respiratory status: spontaneous breathing and respiratory function stable Cardiovascular status: stable Anesthetic complications: no    Last Vitals:  Filed Vitals:   04/06/16 1215 04/06/16 1230  BP: 147/79 152/76  Pulse: 73 70  Temp:    Resp: 19 17    Last Pain:  Filed Vitals:   04/06/16 1233  PainSc: Asleep                 Kerby Hockley DANIEL

## 2016-04-07 DIAGNOSIS — Z791 Long term (current) use of non-steroidal anti-inflammatories (NSAID): Secondary | ICD-10-CM | POA: Diagnosis not present

## 2016-04-07 DIAGNOSIS — K429 Umbilical hernia without obstruction or gangrene: Secondary | ICD-10-CM | POA: Diagnosis not present

## 2016-04-07 DIAGNOSIS — E669 Obesity, unspecified: Secondary | ICD-10-CM | POA: Diagnosis not present

## 2016-04-07 DIAGNOSIS — I10 Essential (primary) hypertension: Secondary | ICD-10-CM | POA: Diagnosis not present

## 2016-04-07 DIAGNOSIS — E039 Hypothyroidism, unspecified: Secondary | ICD-10-CM | POA: Diagnosis not present

## 2016-04-07 DIAGNOSIS — E7800 Pure hypercholesterolemia, unspecified: Secondary | ICD-10-CM | POA: Diagnosis not present

## 2016-04-07 DIAGNOSIS — F17200 Nicotine dependence, unspecified, uncomplicated: Secondary | ICD-10-CM | POA: Diagnosis not present

## 2016-04-07 DIAGNOSIS — Z79899 Other long term (current) drug therapy: Secondary | ICD-10-CM | POA: Diagnosis not present

## 2016-04-07 DIAGNOSIS — K432 Incisional hernia without obstruction or gangrene: Secondary | ICD-10-CM | POA: Diagnosis not present

## 2016-04-07 MED ORDER — HYDROCODONE-ACETAMINOPHEN 5-325 MG PO TABS: 1.0000 | ORAL_TABLET | ORAL | Status: DC | PRN

## 2016-04-07 NOTE — Progress Notes (Signed)
Assessment unchanged. Pt verbalized understanding of dc instructions through teach back including follow care and when to call the doctor. Script x 1 given as provided by MD. Discharged via foot per pt request accompanied by NT to front entrance.

## 2016-04-07 NOTE — Discharge Summary (Signed)
Physician Discharge Summary  Patient ID: Cassandra [name] MRN: [id number] DOB/AGE: 68/28/1947 68 y.o.  Admit date: 04/06/2016 Discharge date: 04/07/2016  Admission Diagnoses:  Discharge Diagnoses:  Active Problems:   Incisional hernia   Discharged Condition: good  Hospital Course: 70 yo female underwent lap assisted incisional hernia repair with mesh. Post op tolerated clears, ambulated well and pain was controlled with oral meds only. Discharged home POD 1  Consults: None  Significant Diagnostic Studies:   Treatments: surgery: lap incisional hernia repair with mesh  Discharge Exam: Blood pressure 149/69, pulse 66, temperature 99.8 F (37.7 C), temperature source Oral, resp. rate 16, height 5' (1.524 m), weight 87.998 kg (194 lb), SpO2 95 %. General appearance: alert and cooperative Head: Normocephalic, without obvious abnormality, atraumatic Neck: no adenopathy, no carotid bruit, no JVD, supple, symmetrical, trachea midline and thyroid not enlarged, symmetric, no tenderness/mass/nodules Resp: clear to auscultation bilaterally Cardio: regular rate and rhythm, S1, S2 normal, no murmur, click, rub or gallop GI: multiple small incisions c/d/i, no erythema, appropriately tender to plapation right side  Disposition: 01-Home or Self Care  Discharge Instructions    Call MD for:  difficulty breathing, headache or visual disturbances    Complete by:  As directed      Call MD for:  persistant nausea and vomiting    Complete by:  As directed      Call MD for:  redness, tenderness, or signs of infection (pain, swelling, redness, odor or green/yellow discharge around incision site)    Complete by:  As directed      Call MD for:  severe uncontrolled pain    Complete by:  As directed      Call MD for:  temperature >100.4    Complete by:  As directed      Diet - low sodium heart healthy    Complete by:  As directed      Discharge wound care:    Complete by:  As directed   Ok to shower  tomorrow  Glue will likely peel off in 1-3 weeks     Increase activity slowly    Complete by:  As directed      Lifting restrictions    Complete by:  As directed   Do not lift more than 20 pounds for 3-4 weeks            Medication List    TAKE these medications        atorvastatin 80 MG tablet  Commonly known as:  LIPITOR  Take 80 mg by mouth every evening.     benazepril 40 MG tablet  Commonly known as:  LOTENSIN  Take 40 mg by mouth daily.     bisoprolol-hydrochlorothiazide 10-6.25 MG tablet  Commonly known as:  ZIAC  Take 1 tablet by mouth every morning.     CALCIUM PO  Take 1 tablet by mouth 2 (two) times daily.     CO Q 10 PO  Take 1 tablet by mouth every morning.     Fish Oil 1000 MG Caps  Take 1,000 mg by mouth every morning.     HYDROcodone-acetaminophen 5-325 MG tablet  Commonly known as:  NORCO/VICODIN  Take 1-2 tablets by mouth every 4 (four) hours as needed for moderate pain or severe pain.     HYDROcodone-acetaminophen 5-325 MG tablet  Commonly known as:  NORCO/VICODIN  Take 1-2 tablets by mouth every 4 (four) hours as needed for moderate pain or severe pain.  ibuprofen 200 MG tablet  Commonly known as:  ADVIL,MOTRIN  Take 400 mg by mouth every 6 (six) hours as needed for moderate pain.     levothyroxine 100 MCG tablet  Commonly known as:  SYNTHROID, LEVOTHROID  Take 100 mcg by mouth daily before breakfast.     loratadine 10 MG tablet  Commonly known as:  CLARITIN  Take 10 mg by mouth every morning.     Lysine 500 MG Caps  Take 500 mg by mouth every morning.     multivitamin with minerals Tabs tablet  Take 1 tablet by mouth every morning.     niacin 500 MG tablet  Take 1,500 mg by mouth every morning.     TURMERIC PO  Take 1 tablet by mouth daily.     Vitamin D3 2000 units capsule  Take 2,000 Units by mouth every morning.           Follow-up Information    Follow up with Rosario Adie., MD. Schedule an appointment as  soon as possible for a visit in 2 weeks.   Specialty:  General Surgery   Contact information:   [address] [place] [address] [phone]       Signed: Arta Bruce Kinsinger 04/07/2016, 6:55 AM

## 2016-05-11 DIAGNOSIS — M8589 Other specified disorders of bone density and structure, multiple sites: Secondary | ICD-10-CM | POA: Diagnosis not present

## 2016-05-11 DIAGNOSIS — Z853 Personal history of malignant neoplasm of breast: Secondary | ICD-10-CM | POA: Diagnosis not present

## 2016-05-11 DIAGNOSIS — Z1231 Encounter for screening mammogram for malignant neoplasm of breast: Secondary | ICD-10-CM | POA: Diagnosis not present

## 2016-06-04 DIAGNOSIS — R3915 Urgency of urination: Secondary | ICD-10-CM | POA: Diagnosis not present

## 2016-06-04 DIAGNOSIS — Z23 Encounter for immunization: Secondary | ICD-10-CM | POA: Diagnosis not present

## 2016-06-04 DIAGNOSIS — Z0000 Encounter for general adult medical examination without abnormal findings: Secondary | ICD-10-CM | POA: Diagnosis not present

## 2016-06-04 DIAGNOSIS — E039 Hypothyroidism, unspecified: Secondary | ICD-10-CM | POA: Diagnosis not present

## 2016-06-04 DIAGNOSIS — E785 Hyperlipidemia, unspecified: Secondary | ICD-10-CM | POA: Diagnosis not present

## 2016-06-04 DIAGNOSIS — I10 Essential (primary) hypertension: Secondary | ICD-10-CM | POA: Diagnosis not present

## 2017-05-15 DIAGNOSIS — Z853 Personal history of malignant neoplasm of breast: Secondary | ICD-10-CM | POA: Diagnosis not present

## 2017-05-15 DIAGNOSIS — Z1231 Encounter for screening mammogram for malignant neoplasm of breast: Secondary | ICD-10-CM | POA: Diagnosis not present

## 2017-08-14 DIAGNOSIS — Z23 Encounter for immunization: Secondary | ICD-10-CM | POA: Diagnosis not present

## 2017-08-14 DIAGNOSIS — E039 Hypothyroidism, unspecified: Secondary | ICD-10-CM | POA: Diagnosis not present

## 2017-08-14 DIAGNOSIS — I10 Essential (primary) hypertension: Secondary | ICD-10-CM | POA: Diagnosis not present

## 2017-08-14 DIAGNOSIS — E785 Hyperlipidemia, unspecified: Secondary | ICD-10-CM | POA: Diagnosis not present

## 2017-08-14 DIAGNOSIS — M25561 Pain in right knee: Secondary | ICD-10-CM | POA: Diagnosis not present

## 2017-09-26 DIAGNOSIS — E785 Hyperlipidemia, unspecified: Secondary | ICD-10-CM | POA: Diagnosis not present

## 2018-03-06 DIAGNOSIS — Z0000 Encounter for general adult medical examination without abnormal findings: Secondary | ICD-10-CM | POA: Diagnosis not present

## 2018-03-06 DIAGNOSIS — K429 Umbilical hernia without obstruction or gangrene: Secondary | ICD-10-CM | POA: Diagnosis not present

## 2018-03-06 DIAGNOSIS — M25562 Pain in left knee: Secondary | ICD-10-CM | POA: Diagnosis not present

## 2018-03-06 DIAGNOSIS — E039 Hypothyroidism, unspecified: Secondary | ICD-10-CM | POA: Diagnosis not present

## 2018-03-06 DIAGNOSIS — E785 Hyperlipidemia, unspecified: Secondary | ICD-10-CM | POA: Diagnosis not present

## 2018-03-06 DIAGNOSIS — I10 Essential (primary) hypertension: Secondary | ICD-10-CM | POA: Diagnosis not present

## 2018-03-06 DIAGNOSIS — Z1389 Encounter for screening for other disorder: Secondary | ICD-10-CM | POA: Diagnosis not present

## 2018-03-06 DIAGNOSIS — M25561 Pain in right knee: Secondary | ICD-10-CM | POA: Diagnosis not present

## 2018-05-08 DIAGNOSIS — M25562 Pain in left knee: Secondary | ICD-10-CM | POA: Diagnosis not present

## 2018-05-08 DIAGNOSIS — M25561 Pain in right knee: Secondary | ICD-10-CM | POA: Diagnosis not present

## 2018-05-16 DIAGNOSIS — Z853 Personal history of malignant neoplasm of breast: Secondary | ICD-10-CM | POA: Diagnosis not present

## 2018-05-16 DIAGNOSIS — Z1231 Encounter for screening mammogram for malignant neoplasm of breast: Secondary | ICD-10-CM | POA: Diagnosis not present

## 2018-05-16 DIAGNOSIS — M8588 Other specified disorders of bone density and structure, other site: Secondary | ICD-10-CM | POA: Diagnosis not present

## 2018-06-26 DIAGNOSIS — M25561 Pain in right knee: Secondary | ICD-10-CM | POA: Diagnosis not present

## 2018-06-26 DIAGNOSIS — M25562 Pain in left knee: Secondary | ICD-10-CM | POA: Diagnosis not present

## 2018-08-15 DIAGNOSIS — M170 Bilateral primary osteoarthritis of knee: Secondary | ICD-10-CM | POA: Diagnosis not present

## 2018-09-05 DIAGNOSIS — I10 Essential (primary) hypertension: Secondary | ICD-10-CM | POA: Diagnosis not present

## 2018-09-05 DIAGNOSIS — E039 Hypothyroidism, unspecified: Secondary | ICD-10-CM | POA: Diagnosis not present

## 2018-09-05 DIAGNOSIS — E785 Hyperlipidemia, unspecified: Secondary | ICD-10-CM | POA: Diagnosis not present

## 2018-09-05 DIAGNOSIS — Z23 Encounter for immunization: Secondary | ICD-10-CM | POA: Diagnosis not present

## 2018-12-17 DIAGNOSIS — M25562 Pain in left knee: Secondary | ICD-10-CM | POA: Diagnosis not present

## 2018-12-17 DIAGNOSIS — M25561 Pain in right knee: Secondary | ICD-10-CM | POA: Diagnosis not present

## 2018-12-17 DIAGNOSIS — M170 Bilateral primary osteoarthritis of knee: Secondary | ICD-10-CM | POA: Diagnosis not present

## 2018-12-25 DIAGNOSIS — M170 Bilateral primary osteoarthritis of knee: Secondary | ICD-10-CM | POA: Diagnosis not present

## 2019-01-01 DIAGNOSIS — M170 Bilateral primary osteoarthritis of knee: Secondary | ICD-10-CM | POA: Diagnosis not present

## 2019-01-01 DIAGNOSIS — M25562 Pain in left knee: Secondary | ICD-10-CM | POA: Diagnosis not present

## 2019-01-01 DIAGNOSIS — M25561 Pain in right knee: Secondary | ICD-10-CM | POA: Diagnosis not present

## 2019-03-09 DIAGNOSIS — Z1389 Encounter for screening for other disorder: Secondary | ICD-10-CM | POA: Diagnosis not present

## 2019-03-09 DIAGNOSIS — I10 Essential (primary) hypertension: Secondary | ICD-10-CM | POA: Diagnosis not present

## 2019-03-09 DIAGNOSIS — E785 Hyperlipidemia, unspecified: Secondary | ICD-10-CM | POA: Diagnosis not present

## 2019-03-09 DIAGNOSIS — M8580 Other specified disorders of bone density and structure, unspecified site: Secondary | ICD-10-CM | POA: Diagnosis not present

## 2019-03-09 DIAGNOSIS — E039 Hypothyroidism, unspecified: Secondary | ICD-10-CM | POA: Diagnosis not present

## 2019-03-09 DIAGNOSIS — Z0000 Encounter for general adult medical examination without abnormal findings: Secondary | ICD-10-CM | POA: Diagnosis not present

## 2019-03-31 DIAGNOSIS — R0602 Shortness of breath: Secondary | ICD-10-CM | POA: Diagnosis not present

## 2019-04-01 DIAGNOSIS — R0602 Shortness of breath: Secondary | ICD-10-CM | POA: Diagnosis not present

## 2019-04-06 ENCOUNTER — Other Ambulatory Visit: Payer: Self-pay

## 2019-04-06 ENCOUNTER — Inpatient Hospital Stay (HOSPITAL_COMMUNITY)
Admission: EM | Admit: 2019-04-06 | Discharge: 2019-04-08 | DRG: 176 | Disposition: A | Discharge status: Home / Self Care | Payer: Medicare HMO | Attending: Family Medicine | Admitting: Family Medicine

## 2019-04-06 ENCOUNTER — Emergency Department (HOSPITAL_COMMUNITY): Payer: Medicare HMO

## 2019-04-06 ENCOUNTER — Encounter (HOSPITAL_COMMUNITY): Payer: Self-pay | Admitting: Emergency Medicine

## 2019-04-06 DIAGNOSIS — R079 Chest pain, unspecified: Secondary | ICD-10-CM | POA: Diagnosis not present

## 2019-04-06 DIAGNOSIS — R0902 Hypoxemia: Secondary | ICD-10-CM

## 2019-04-06 DIAGNOSIS — Z853 Personal history of malignant neoplasm of breast: Secondary | ICD-10-CM | POA: Diagnosis not present

## 2019-04-06 DIAGNOSIS — Z7951 Long term (current) use of inhaled steroids: Secondary | ICD-10-CM | POA: Diagnosis not present

## 2019-04-06 DIAGNOSIS — E039 Hypothyroidism, unspecified: Secondary | ICD-10-CM | POA: Diagnosis not present

## 2019-04-06 DIAGNOSIS — Z79899 Other long term (current) drug therapy: Secondary | ICD-10-CM | POA: Diagnosis not present

## 2019-04-06 DIAGNOSIS — Z20828 Contact with and (suspected) exposure to other viral communicable diseases: Secondary | ICD-10-CM | POA: Diagnosis not present

## 2019-04-06 DIAGNOSIS — R0602 Shortness of breath: Secondary | ICD-10-CM | POA: Diagnosis not present

## 2019-04-06 DIAGNOSIS — Z79890 Hormone replacement therapy (postmenopausal): Secondary | ICD-10-CM

## 2019-04-06 DIAGNOSIS — I289 Disease of pulmonary vessels, unspecified: Secondary | ICD-10-CM

## 2019-04-06 DIAGNOSIS — R7989 Other specified abnormal findings of blood chemistry: Secondary | ICD-10-CM | POA: Diagnosis not present

## 2019-04-06 DIAGNOSIS — I82432 Acute embolism and thrombosis of left popliteal vein: Secondary | ICD-10-CM | POA: Diagnosis not present

## 2019-04-06 DIAGNOSIS — R05 Cough: Secondary | ICD-10-CM | POA: Diagnosis not present

## 2019-04-06 DIAGNOSIS — I2699 Other pulmonary embolism without acute cor pulmonale: Secondary | ICD-10-CM | POA: Diagnosis not present

## 2019-04-06 DIAGNOSIS — R7303 Prediabetes: Secondary | ICD-10-CM | POA: Diagnosis present

## 2019-04-06 DIAGNOSIS — I272 Pulmonary hypertension, unspecified: Secondary | ICD-10-CM | POA: Diagnosis present

## 2019-04-06 DIAGNOSIS — I361 Nonrheumatic tricuspid (valve) insufficiency: Secondary | ICD-10-CM | POA: Diagnosis not present

## 2019-04-06 DIAGNOSIS — R945 Abnormal results of liver function studies: Secondary | ICD-10-CM | POA: Diagnosis present

## 2019-04-06 DIAGNOSIS — I749 Embolism and thrombosis of unspecified artery: Secondary | ICD-10-CM

## 2019-04-06 DIAGNOSIS — E785 Hyperlipidemia, unspecified: Secondary | ICD-10-CM | POA: Diagnosis present

## 2019-04-06 DIAGNOSIS — E7439 Other disorders of intestinal carbohydrate absorption: Secondary | ICD-10-CM | POA: Diagnosis present

## 2019-04-06 DIAGNOSIS — K760 Fatty (change of) liver, not elsewhere classified: Secondary | ICD-10-CM | POA: Diagnosis present

## 2019-04-06 DIAGNOSIS — E876 Hypokalemia: Secondary | ICD-10-CM | POA: Diagnosis not present

## 2019-04-06 DIAGNOSIS — E669 Obesity, unspecified: Secondary | ICD-10-CM | POA: Diagnosis present

## 2019-04-06 DIAGNOSIS — I10 Essential (primary) hypertension: Secondary | ICD-10-CM | POA: Diagnosis present

## 2019-04-06 LAB — CBC WITH DIFFERENTIAL/PLATELET
Abs Immature Granulocytes: 0.02 10*3/uL (ref 0.00–0.07)
Basophils Absolute: 0.1 10*3/uL (ref 0.0–0.1)
Basophils Relative: 1 %
Eosinophils Absolute: 0.1 10*3/uL (ref 0.0–0.5)
Eosinophils Relative: 1 %
HCT: 41.1 % (ref 36.0–46.0)
Hemoglobin: 13 g/dL (ref 12.0–15.0)
Immature Granulocytes: 0 %
Lymphocytes Relative: 35 %
Lymphs Abs: 3.2 10*3/uL (ref 0.7–4.0)
MCH: 30.3 pg (ref 26.0–34.0)
MCHC: 31.6 g/dL (ref 30.0–36.0)
MCV: 95.8 fL (ref 80.0–100.0)
Monocytes Absolute: 0.8 10*3/uL (ref 0.1–1.0)
Monocytes Relative: 8 %
Neutro Abs: 5.1 10*3/uL (ref 1.7–7.7)
Neutrophils Relative %: 55 %
Platelets: 364 10*3/uL (ref 150–400)
RBC: 4.29 MIL/uL (ref 3.87–5.11)
RDW: 14.8 % (ref 11.5–15.5)
WBC: 9.2 10*3/uL (ref 4.0–10.5)
nRBC: 0 % (ref 0.0–0.2)

## 2019-04-06 LAB — COMPREHENSIVE METABOLIC PANEL
ALT: 67 U/L — ABNORMAL HIGH (ref 0–44)
AST: 40 U/L (ref 15–41)
Albumin: 3.8 g/dL (ref 3.5–5.0)
Alkaline Phosphatase: 114 U/L (ref 38–126)
Anion gap: 12 (ref 5–15)
BUN: 22 mg/dL (ref 8–23)
CO2: 23 mmol/L (ref 22–32)
Calcium: 9.7 mg/dL (ref 8.9–10.3)
Chloride: 105 mmol/L (ref 98–111)
Creatinine, Ser: 0.92 mg/dL (ref 0.44–1.00)
GFR calc Af Amer: >60 mL/min (ref >60)
GFR calc non Af Amer: >60 mL/min (ref >60)
Glucose, Bld: 101 mg/dL — ABNORMAL HIGH (ref 70–99)
Potassium: 3.3 mmol/L — ABNORMAL LOW (ref 3.5–5.1)
Sodium: 140 mmol/L (ref 135–145)
Total Bilirubin: 0.5 mg/dL (ref 0.3–1.2)
Total Protein: 7.3 g/dL (ref 6.5–8.1)

## 2019-04-06 LAB — SARS CORONAVIRUS 2 BY RT PCR (HOSPITAL ORDER, PERFORMED IN ~~LOC~~ HOSPITAL LAB): SARS Coronavirus 2: NEGATIVE

## 2019-04-06 LAB — BRAIN NATRIURETIC PEPTIDE: B Natriuretic Peptide: 862.8 pg/mL — ABNORMAL HIGH (ref 0.0–100.0)

## 2019-04-06 LAB — LIPASE, BLOOD: Lipase: 49 U/L (ref 11–51)

## 2019-04-06 LAB — TROPONIN I (HIGH SENSITIVITY): Troponin I (High Sensitivity): 11 ng/L (ref <18)

## 2019-04-06 LAB — TSH: TSH: 1.776 u[IU]/mL (ref 0.350–4.500)

## 2019-04-06 LAB — D-DIMER, QUANTITATIVE: D-Dimer, Quant: 7.14 ug/mL-FEU — ABNORMAL HIGH (ref 0.00–0.50)

## 2019-04-06 MED ORDER — ACETAMINOPHEN 325 MG PO TABS: 650.0000 mg | ORAL_TABLET | Freq: Four times a day (QID) | ORAL | Status: DC | PRN

## 2019-04-06 MED ORDER — SODIUM CHLORIDE (PF) 0.9 % IJ SOLN
INTRAMUSCULAR | Status: AC
  Administered 2019-04-06: 23:00:00
  Filled 2019-04-06: qty 50

## 2019-04-06 MED ORDER — SODIUM CHLORIDE 0.9 % IV SOLN
INTRAVENOUS | Status: DC
  Administered 2019-04-07: 02:00:00 via INTRAVENOUS

## 2019-04-06 MED ORDER — ACETAMINOPHEN 650 MG RE SUPP: 650.0000 mg | Freq: Four times a day (QID) | RECTAL | Status: DC | PRN

## 2019-04-06 MED ORDER — IOHEXOL 350 MG/ML SOLN
100.0000 mL | Freq: Once | INTRAVENOUS | Status: AC | PRN
  Administered 2019-04-06: 100 mL via INTRAVENOUS

## 2019-04-06 NOTE — H&P (Addendum)
TRH H&P    Patient Demographics:    Cassandra [name], is a 73 y.o. female  MRN: [id number]  DOB - 06-Jan-1946  Admit Date - 04/06/2019  Referring MD/NP/PA: Elnora Morrison  Outpatient Primary MD for the patient is Harlan Stains, MD  Patient coming from:  home  Chief complaint- dyspnea   HPI:    Cassandra [name]  is a 73 y.o. female, w hypertension, hyperlipidemia, hypothyroidism, glucose intolerance, h/o breast cancer, h/o diverticulitis apparently c/o dyspnea for the past several days.  Pt denies fever, chills, cough, cp, palp, n/v, abd pain, diarrhea, brbpr, black stool.  Pt denies family hx of blood clot. Pt denies weight loss.   In ED,  T 98.4, P 68  R 21  Bp 113/71  Pox 88% on RA  CTA chest IMPRESSION: Positive for acute PE with CT evidence of right heart strain (RV/LV Ratio = 1.8) consistent with at least submassive (intermediate risk) PE. The presence of right heart strain has been associated with an increased risk of morbidity and mortality. Please activate Code PE by paging [phone].  Wbc 9.2, Hgb 13.0, Plt 364 Na 140, K 3.3, Bun 22, Creatinine 0.92 Ast 40, Alt 67  Trop 11 BNP 862.8 D dimer 7.14  covid 19 negative  Pt started on heparin gtt  ekg  nsr at 70, nl axis, nl int,  t inversion in v1-3  Pt will be admitted for acute pulmonary embolism with right heart strain.        Review of systems:    In addition to the HPI above,  No Fever-chills, No Headache, No changes with Vision or hearing, No problems swallowing food or Liquids, Twinge of chest pain over the past week intermittently.  Slight dry  Cough No Abdominal pain, No Nausea or Vomiting, bowel movements are regular, No Blood in stool or Urine, No dysuria, No new skin rashes or bruises, No new joints pains-aches,  No new weakness, tingling, numbness in any extremity, No recent weight gain or loss, No polyuria,  polydypsia or polyphagia, No significant Mental Stressors.  All other systems reviewed and are negative.    Past History of the following :    Past Medical History:  Diagnosis Date  . Cancer [facility])    Breast cancer right '06- surgery, chemo, radiation- released after 5 yrs  . Hx of seasonal allergies    OTC med used  . Hypertension   . Hypothyroidism       Past Surgical History:  Procedure Laterality Date  . BREAST SURGERY Right    modified partial mastectomy/ lymph nodes dissection  . COLON SURGERY  2/17  . INCISIONAL HERNIA REPAIR N/A 04/06/2016   Procedure: LAPAROSCOPIC ASSISTED INCISIONAL HERNIA REPAIR WITH MESH;  Surgeon: Leighton Ruff, MD;  Location: [facility] ORS;  Service: General;  Laterality: N/A;  . TONSILLECTOMY        Social History:      Social History   Tobacco Use  . Smoking status: Never Smoker  . Smokeless tobacco: Never Used  Substance Use Topics  .  Alcohol use: Yes    Comment: rare       Family History :     Family History  Problem Relation Age of Onset  . COPD Father        Home Medications:   Prior to Admission medications   Medication Sig Start Date End Date Taking? Authorizing Provider  atorvastatin (LIPITOR) 80 MG tablet Take 80 mg by mouth every evening.   Yes [provider]  benazepril (LOTENSIN) 40 MG tablet Take 40 mg by mouth at bedtime.  03/16/16  Yes [provider]  bisoprolol-hydrochlorothiazide (ZIAC) 10-6.25 MG tablet Take 1 tablet by mouth every morning.   Yes [provider]  CALCIUM PO Take 1 tablet by mouth 2 (two) times daily.   Yes [provider]  Cholecalciferol (VITAMIN D3) 2000 units capsule Take 2,000 Units by mouth every morning.   Yes [provider]  Coenzyme Q10 (CO Q 10 PO) Take 1 tablet by mouth every morning.   Yes [provider]  ibuprofen (ADVIL,MOTRIN) 200 MG tablet Take 400 mg by mouth every 6 (six) hours as needed for moderate pain.   Yes [provider]  levothyroxine (SYNTHROID, LEVOTHROID) 100 MCG tablet Take 100 mcg by mouth daily before breakfast.   Yes [provider]  loratadine (CLARITIN) 10 MG tablet Take 10 mg by mouth every morning.   Yes [provider]  Lysine 500 MG CAPS Take 500 mg by mouth every morning.   Yes [provider]  Multiple Vitamin (MULTIVITAMIN WITH MINERALS) TABS tablet Take 1 tablet by mouth every morning.   Yes [provider]  Omega-3 Fatty Acids (FISH OIL) 1000 MG CAPS Take 1,000 mg by mouth every morning. 06/26/12  Yes [provider]  TURMERIC PO Take 1 tablet by mouth daily.   Yes [provider]  albuterol (VENTOLIN HFA) 108 (90 Base) MCG/ACT inhaler Inhale 2 puffs into the lungs every 4 (four) hours as needed. 03/31/19   [provider]  HYDROcodone-acetaminophen (NORCO/VICODIN) 5-325 MG tablet Take 1-2 tablets by mouth every 4 (four) hours as needed for moderate pain or severe pain. 04/07/16   Kinsinger, Arta Bruce, MD  NIFEdipine (ADALAT CC) 30 MG 24 hr tablet Take 30 mg by mouth daily after breakfast. 03/17/19   [provider]     Allergies:    No Known Allergies   Physical Exam:   Vitals  Blood pressure (!) 148/91, pulse 65, temperature 98.4 F (36.9 C), temperature source Oral, resp. rate 20, height 5\' 1"  (1.549 m), weight 88.9 kg, SpO2 94 %.  1.  General: axoxo3  2. Psychiatric: euthymic  3. Neurologic: cn2-12 intact, reflexes 2+ symmetric, diffuse with no clonus, motor 5/5 in all 4 ext  4. HEENMT:  Anicteric, pupils 1.57mm symmetric, direct, consensual, near intact Neck:no jvd, no bruit  5. Respiratory : CTAB  6. Cardiovascular : rrr s1, s2, no m/g/r,  No loud p2  7. Gastrointestinal:  Abd: soft, nt, nd, +bs  8. Skin:  Ext: no c/c/e,  No rash,  No palmar erythema,   9.Musculoskeletal:  Good ROM  No adenopathy     Data Review:    CBC Recent Labs  Lab 04/06/19 2142  WBC 9.2  HGB  13.0  HCT 41.1  PLT 364  MCV 95.8  MCH 30.3  MCHC 31.6  RDW 14.8  LYMPHSABS 3.2  MONOABS 0.8  EOSABS 0.1  BASOSABS 0.1   ------------------------------------------------------------------------------------------------------------------  Results for orders placed or  performed during the hospital encounter of 04/06/19 (from the past 48 hour(s))  CBC with Differential     Status: None   Collection Time: 04/06/19  9:42 PM  Result Value Ref Range   WBC 9.2 4.0 - 10.5 K/uL   RBC 4.29 3.87 - 5.11 MIL/uL   Hemoglobin 13.0 12.0 - 15.0 g/dL   HCT 41.1 36.0 - 46.0 %   MCV 95.8 80.0 - 100.0 fL   MCH 30.3 26.0 - 34.0 pg   MCHC 31.6 30.0 - 36.0 g/dL   RDW 14.8 11.5 - 15.5 %   Platelets 364 150 - 400 K/uL   nRBC 0.0 0.0 - 0.2 %   Neutrophils Relative % 55 %   Neutro Abs 5.1 1.7 - 7.7 K/uL   Lymphocytes Relative 35 %   Lymphs Abs 3.2 0.7 - 4.0 K/uL   Monocytes Relative 8 %   Monocytes Absolute 0.8 0.1 - 1.0 K/uL   Eosinophils Relative 1 %   Eosinophils Absolute 0.1 0.0 - 0.5 K/uL   Basophils Relative 1 %   Basophils Absolute 0.1 0.0 - 0.1 K/uL   Immature Granulocytes 0 %   Abs Immature Granulocytes 0.02 0.00 - 0.07 K/uL    Comment: Performed at [facility], [place] [address], [place], [place] [address]  Comprehensive metabolic panel     Status: Abnormal   Collection Time: 04/06/19  9:42 PM  Result Value Ref Range   Sodium 140 135 - 145 mmol/L   Potassium 3.3 (L) 3.5 - 5.1 mmol/L   Chloride 105 98 - 111 mmol/L   CO2 23 22 - 32 mmol/L   Glucose, Bld 101 (H) 70 - 99 mg/dL   BUN 22 8 - 23 mg/dL   Creatinine, Ser 0.92 0.44 - 1.00 mg/dL   Calcium 9.7 8.9 - 10.3 mg/dL   Total Protein 7.3 6.5 - 8.1 g/dL   Albumin 3.8 3.5 - 5.0 g/dL   AST 40 15 - 41 U/L   ALT 67 (H) 0 - 44 U/L   Alkaline Phosphatase 114 38 - 126 U/L   Total Bilirubin 0.5 0.3 - 1.2 mg/dL   GFR calc non Af Amer >60 >60 mL/min   GFR calc Af Amer >60 >60 mL/min   Anion gap 12 5 - 15    Comment:  Performed at [facility], [place] [address] [place], [place] [address]  Troponin I (High Sensitivity)     Status: None   Collection Time: 04/06/19  9:42 PM  Result Value Ref Range   Troponin I (High Sensitivity) 11 <18 ng/L    Comment: (NOTE) Elevated high sensitivity troponin I (hsTnI) values and significant  changes across serial measurements may suggest ACS but many other  chronic and acute conditions are known to elevate hsTnI results.  Refer to the "Links" section for chest pain algorithms and additional  guidance. Performed at [facility], [place] [address], [place], [place] [address]   D-dimer, quantitative (not at [facility])     Status: Abnormal   Collection Time: 04/06/19  9:42 PM  Result Value Ref Range   D-Dimer, Quant 7.14 (H) 0.00 - 0.50 ug/mL-FEU    Comment: (NOTE) At the manufacturer cut-off of 0.50 ug/mL FEU, this assay has been documented to exclude PE with a sensitivity and negative predictive value of 97 to 99%.  At this time, this assay has not been approved by the FDA to exclude DVT/VTE. Results should be correlated with clinical presentation. Performed  at [facility], [place] [address] [place], [place] [address]   Lipase, blood     Status: None   Collection Time: 04/06/19  9:42 PM  Result Value Ref Range   Lipase 49 11 - 51 U/L    Comment: Performed at [facility], [place] [address], [place], [place] [address]  Brain natriuretic peptide     Status: Abnormal   Collection Time: 04/06/19 10:13 PM  Result Value Ref Range   B Natriuretic Peptide 862.8 (H) 0.0 - 100.0 pg/mL    Comment: Performed at [facility], [place] [address], [place], [place] [address]  SARS Coronavirus 2 (CEPHEID- Performed in [place] hospital lab), Hosp Order     Status: None   Collection Time: 04/06/19 10:13 PM   Specimen: Nasopharyngeal Swab  Result Value Ref Range   SARS Coronavirus 2 NEGATIVE  NEGATIVE    Comment: (NOTE) If result is NEGATIVE SARS-CoV-2 target nucleic acids are NOT DETECTED. The SARS-CoV-2 RNA is generally detectable in upper and lower  respiratory specimens during the acute phase of infection. The lowest  concentration of SARS-CoV-2 viral copies this assay can detect is 250  copies / mL. A negative result does not preclude SARS-CoV-2 infection  and should not be used as the sole basis for treatment or other  patient management decisions.  A negative result may occur with  improper specimen collection / handling, submission of specimen other  than nasopharyngeal swab, presence of viral mutation(s) within the  areas targeted by this assay, and inadequate number of viral copies  (<250 copies / mL). A negative result must be combined with clinical  observations, patient history, and epidemiological information. If result is POSITIVE SARS-CoV-2 target nucleic acids are DETECTED. The SARS-CoV-2 RNA is generally detectable in upper and lower  respiratory specimens dur ing the acute phase of infection.  Positive  results are indicative of active infection with SARS-CoV-2.  Clinical  correlation with patient history and other diagnostic information is  necessary to determine patient infection status.  Positive results do  not rule out bacterial infection or co-infection with other viruses. If result is PRESUMPTIVE POSTIVE SARS-CoV-2 nucleic acids MAY BE PRESENT.   A presumptive positive result was obtained on the submitted specimen  and confirmed on repeat testing.  While 2019 novel coronavirus  (SARS-CoV-2) nucleic acids may be present in the submitted sample  additional confirmatory testing may be necessary for epidemiological  and / or clinical management purposes  to differentiate between  SARS-CoV-2 and other Sarbecovirus currently known to infect humans.  If clinically indicated additional testing with an alternate test  methodology [phone]) is advised. The  SARS-CoV-2 RNA is generally  detectable in upper and lower respiratory sp ecimens during the acute  phase of infection. The expected result is Negative. Fact Sheet for Patients:  StrictlyIdeas.no Fact Sheet for Healthcare Providers: BankingDealers.co.za This test is not yet approved or cleared by the [place] FDA and has been authorized for detection and/or diagnosis of SARS-CoV-2 by FDA under an Emergency Use Authorization (EUA).  This EUA will remain in effect (meaning this test can be used) for the duration of the COVID-19 declaration under Section 564(b)(1) of the Act, 21 U.S.C. section 360bbb-3(b)(1), unless the authorization is terminated or revoked sooner. Performed at [facility], [place] [address] [place], [place] [address]   TSH     Status: None   Collection Time: 04/06/19 10:13 PM  Result Value Ref Range  TSH 1.776 0.350 - 4.500 uIU/mL    Comment: Performed by a 3rd Generation assay with a functional sensitivity of <=0.01 uIU/mL. Performed at [facility], [place] [address] [place], [place] [address]     Chemistries  Recent Labs  Lab 04/06/19 2142  NA 140  K 3.3*  CL 105  CO2 23  GLUCOSE 101*  BUN 22  CREATININE 0.92  CALCIUM 9.7  AST 40  ALT 67*  ALKPHOS 114  BILITOT 0.5   ------------------------------------------------------------------------------------------------------------------  ------------------------------------------------------------------------------------------------------------------ GFR: Estimated Creatinine Clearance: 57.7 mL/min (by C-G formula based on SCr of 0.92 mg/dL). Liver Function Tests: Recent Labs  Lab 04/06/19 2142  AST 40  ALT 67*  ALKPHOS 114  BILITOT 0.5  PROT 7.3  ALBUMIN 3.8   Recent Labs  Lab 04/06/19 2142  LIPASE 49   No results for input(s): AMMONIA in the last 168 hours. Coagulation Profile: No results for input(s):  INR, PROTIME in the last 168 hours. Cardiac Enzymes: No results for input(s): CKTOTAL, CKMB, CKMBINDEX, TROPONINI in the last 168 hours. BNP (last 3 results) No results for input(s): PROBNP in the last 8760 hours. HbA1C: No results for input(s): HGBA1C in the last 72 hours. CBG: No results for input(s): GLUCAP in the last 168 hours. Lipid Profile: No results for input(s): CHOL, HDL, LDLCALC, TRIG, CHOLHDL, LDLDIRECT in the last 72 hours. Thyroid Function Tests: Recent Labs    04/06/19 2213  TSH 1.776   Anemia Panel: No results for input(s): VITAMINB12, FOLATE, FERRITIN, TIBC, IRON, RETICCTPCT in the last 72 hours.  --------------------------------------------------------------------------------------------------------------- Urine analysis: No results found for: COLORURINE, APPEARANCEUR, LABSPEC, PHURINE, GLUCOSEU, HGBUR, BILIRUBINUR, KETONESUR, PROTEINUR, UROBILINOGEN, NITRITE, LEUKOCYTESUR    Imaging Results:    Ct Angio Chest Pe W And/or Wo Contrast  Result Date: 04/06/2019 CLINICAL DATA:  Increasing shortness of breath EXAM: CT ANGIOGRAPHY CHEST WITH CONTRAST TECHNIQUE: Multidetector CT imaging of the chest was performed using the standard protocol during bolus administration of intravenous contrast. Multiplanar CT image reconstructions and MIPs were obtained to evaluate the vascular anatomy. CONTRAST:  126mL OMNIPAQUE IOHEXOL 350 MG/ML SOLN COMPARISON:  For 02/2006 report. FINDINGS: Cardiovascular: Extensive bilateral pulmonary emboli are noted involving all lobes of both lungs, right greater than left. RV to LV ratio is 1.8, significantly elevated compatible with right heart strain. Heart is upper limits normal in size. Mediastinum/Nodes: No mediastinal, hilar, or axillary adenopathy. Lungs/Pleura: Trace right pleural effusion. Patchy ground-glass opacities in the posterior left upper lobe, anteromedial right middle lobe which could reflect early infarcts. Upper Abdomen: Imaging  into the upper abdomen shows no acute findings. Musculoskeletal: Chest wall soft tissues are unremarkable. No acute bony abnormality. Review of the MIP images confirms the above findings. IMPRESSION: Positive for acute PE with CT evidence of right heart strain (RV/LV Ratio = 1.8) consistent with at least submassive (intermediate risk) PE. The presence of right heart strain has been associated with an increased risk of morbidity and mortality. Please activate Code PE by paging [phone]. Critical Value/emergent results were called by telephone at the time of interpretation on 04/06/2019 at 11:37 pm to Dr. Marda Stalker , who verbally acknowledged these results. Electronically Signed   By: Rolm Baptise M.D.   On: 04/06/2019 23:37   Dg Chest Portable 1 View  Result Date: 04/06/2019 CLINICAL DATA:  Shortness of breath, chest pain, fatigue, cough EXAM: PORTABLE CHEST 1 VIEW COMPARISON:  None. FINDINGS: Heart and mediastinal contours are within normal limits. No focal opacities or effusions. No acute  bony abnormality. IMPRESSION: No active disease. Electronically Signed   By: Rolm Baptise M.D.   On: 04/06/2019 22:14       Assessment & Plan:    Principal Problem:   Pulmonary embolism (HCC) Active Problems:   Abnormal liver function   Hypokalemia  Acute pulmonary embolism Tele Trop I q3h x2 Check cardiac echo Heparin gtt Pulmonary consulted by ED, appreciate input  Abnormal liver function ? lipitor Check acute hepatitis panel Check CPK Check RUQ ultrasound Check cmp in am  Hypokalemia Replete Check cmp in am  Hypothyroidism Cont Levothyroxine 100 micrograms po qday  Hypertension Cont Nifedipine 30mg  po qday Cont Benazepril 40mg  po qhs Cont Bisoprolol-hydrochlorothiazide 10/6.25mg  po qday  DVT Prophylaxis-   Heparin gtt  AM Labs Ordered, also please review Full Orders  Family Communication: Admission, patients condition and plan of care including tests being ordered have  been discussed with the patient who indicate understanding and agree with the plan and Code Status.  Code Status:  FULL CODE,   Admission status: /Inpatient: Based on patients clinical presentation and evaluation of above clinical data, I have made determination that patient meets Inpatient criteria at this time. Pt has PE with right heart strain, pt will require iv heparin,  High risk of clinical deterioration, and pt will require > 2 nites stay and therefore inpatient admission  Time spent in minutes : 70   Jani Gravel M.D on 04/07/2019 at 1:36 AM

## 2019-04-06 NOTE — ED Provider Notes (Signed)
[facility] DEPT Provider Note   CSN: [id number] Arrival date & time: 04/06/19  1955     History   Chief Complaint Chief Complaint  Patient presents with  . Shortness of Breath    HPI Cassandra [name] is a 73 y.o. female.     The history is provided by the patient and medical records. No language interpreter was used.  Shortness of Breath Severity:  Severe Onset quality:  Gradual Duration:  2 weeks Timing:  Constant Progression:  Waxing and waning Chronicity:  New Context: activity   Relieved by:  Rest Worsened by:  Deep breathing and exertion Ineffective treatments:  None tried Associated symptoms: chest pain and cough   Associated symptoms: no abdominal pain, no diaphoresis, no fever, no headaches, no neck pain, no rash, no sputum production, no vomiting and no wheezing   Risk factors: hx of cancer     Past Medical History:  Diagnosis Date  . Cancer [facility])    Breast cancer right '06- surgery, chemo, radiation- released after 5 yrs  . Hx of seasonal allergies    OTC med used  . Hypertension   . Hypothyroidism     Patient Active Problem List   Diagnosis Date Noted  . Incisional hernia 04/06/2016    Past Surgical History:  Procedure Laterality Date  . BREAST SURGERY Right    modified partial mastectomy/ lymph nodes dissection  . COLON SURGERY  2/17  . INCISIONAL HERNIA REPAIR N/A 04/06/2016   Procedure: LAPAROSCOPIC ASSISTED INCISIONAL HERNIA REPAIR WITH MESH;  Surgeon: Leighton Ruff, MD;  Location: [facility] ORS;  Service: General;  Laterality: N/A;  . TONSILLECTOMY       OB History   No obstetric history on file.      Home Medications    Prior to Admission medications   Medication Sig Start Date End Date Taking? Authorizing Provider  atorvastatin (LIPITOR) 80 MG tablet Take 80 mg by mouth every evening.    [provider]  benazepril (LOTENSIN) 40 MG tablet Take 40 mg by mouth daily. 03/16/16   [provider]  bisoprolol-hydrochlorothiazide (ZIAC) 10-6.25 MG tablet Take 1 tablet by mouth every morning.    [provider]  CALCIUM PO Take 1 tablet by mouth 2 (two) times daily.    [provider]  Cholecalciferol (VITAMIN D3) 2000 units capsule Take 2,000 Units by mouth every morning.    [provider]  Coenzyme Q10 (CO Q 10 PO) Take 1 tablet by mouth every morning.    [provider]  HYDROcodone-acetaminophen (NORCO/VICODIN) 5-325 MG tablet Take 1-2 tablets by mouth every 4 (four) hours as needed for moderate pain or severe pain. Patient not taking: Reported on 03/23/2016 7/89/38   Leighton Ruff, MD  HYDROcodone-acetaminophen (NORCO/VICODIN) 5-325 MG tablet Take 1-2 tablets by mouth every 4 (four) hours as needed for moderate pain or severe pain. 04/07/16   Kinsinger, Arta Bruce, MD  ibuprofen (ADVIL,MOTRIN) 200 MG tablet Take 400 mg by mouth every 6 (six) hours as needed for moderate pain.    [provider]  levothyroxine (SYNTHROID, LEVOTHROID) 100 MCG tablet Take 100 mcg by mouth daily before breakfast.    [provider]  loratadine (CLARITIN) 10 MG tablet Take 10 mg by mouth every morning.    [provider]  Lysine 500 MG CAPS Take 500 mg by mouth every morning.    [provider]  Multiple Vitamin (MULTIVITAMIN WITH MINERALS) TABS tablet Take 1 tablet by  mouth every morning.    [provider]  niacin 500 MG tablet Take 1,500 mg by mouth every morning.    [provider]  Omega-3 Fatty Acids (FISH OIL) 1000 MG CAPS Take 1,000 mg by mouth every morning. 06/26/12   [provider]  TURMERIC PO Take 1 tablet by mouth daily.    [provider]    Family History History reviewed. No pertinent family history.  Social History Social History   Tobacco Use  . Smoking status: Never Smoker  . Smokeless tobacco: Never Used  Substance Use Topics  . Alcohol use: Yes    Comment:  rare  . Drug use: No     Allergies   Patient has no known allergies.   Review of Systems Review of Systems  Constitutional: Positive for fatigue. Negative for chills, diaphoresis and fever.  HENT: Negative for congestion.   Respiratory: Positive for cough, chest tightness and shortness of breath. Negative for sputum production and wheezing.   Cardiovascular: Positive for chest pain. Negative for palpitations and leg swelling.  Gastrointestinal: Negative for abdominal pain, constipation, diarrhea, nausea and vomiting.  Genitourinary: Negative for dysuria.  Musculoskeletal: Negative for back pain, neck pain and neck stiffness.  Skin: Negative for rash and wound.  Neurological: Negative for light-headedness and headaches.  Psychiatric/Behavioral: Negative for agitation.  All other systems reviewed and are negative.    Physical Exam Updated Vital Signs BP 113/71 (BP Location: Left Arm)   Pulse 68   Temp 98.4 F (36.9 C) (Oral)   Resp (!) 21   Ht 5\' 1"  (1.549 m)   Wt 88.9 kg   SpO2 (!) 88%   BMI 37.03 kg/m   Physical Exam Vitals signs and nursing note reviewed.  Constitutional:      General: She is not in acute distress.    Appearance: She is well-developed. She is not ill-appearing, toxic-appearing or diaphoretic.  HENT:     Head: Normocephalic and atraumatic.  Eyes:     Conjunctiva/sclera: Conjunctivae normal.  Neck:     Musculoskeletal: Neck supple.  Cardiovascular:     Rate and Rhythm: Normal rate and regular rhythm.     Heart sounds: No murmur.  Pulmonary:     Effort: Pulmonary effort is normal. No respiratory distress.     Breath sounds: Normal breath sounds. No decreased breath sounds, wheezing, rhonchi or rales.  Chest:     Chest wall: No tenderness.  Abdominal:     Palpations: Abdomen is soft.     Tenderness: There is no abdominal tenderness.  Musculoskeletal:     Right lower leg: She exhibits no tenderness. No edema.     Left lower leg: She exhibits  no tenderness. No edema.  Skin:    General: Skin is warm and dry.  Neurological:     Mental Status: She is alert.      ED Treatments / Results  Labs (all labs ordered are listed, but only abnormal results are displayed) Labs Reviewed  COMPREHENSIVE METABOLIC PANEL - Abnormal; Notable for the following components:      Result Value   Potassium 3.3 (*)    Glucose, Bld 101 (*)    ALT 67 (*)    All other components within normal limits  D-DIMER, QUANTITATIVE (NOT AT [facility]) - Abnormal; Notable for the following components:   D-Dimer, Quant 7.14 (*)    All other components within normal limits  BRAIN NATRIURETIC PEPTIDE - Abnormal; Notable for the following components:  B Natriuretic Peptide 862.8 (*)    All other components within normal limits  SARS CORONAVIRUS 2 (HOSPITAL ORDER, PERFORMED IN [facility])  CBC WITH DIFFERENTIAL/PLATELET  LIPASE, BLOOD  TSH  URINALYSIS, ROUTINE W REFLEX MICROSCOPIC  TROPONIN I (HIGH SENSITIVITY)  TROPONIN I (HIGH SENSITIVITY)    EKG EKG Interpretation  Date/Time:  Monday April 06 2019 20:33:11 EDT Ventricular Rate:  71 PR Interval:    QRS Duration: 94 QT Interval:  437 QTC Calculation: 475 R Axis:   6 Text Interpretation:  Sinus rhythm Short PR interval Low voltage, precordial leads RSR' in V1 or V2, right VCD or RVH Borderline T abnormalities, diffuse leads Whjen compared to prior, new t wave inversion in leads V3-V4.  No STEMI Confirmed by Antony Blackbird [phone]) on 04/06/2019 9:09:34 PM   Radiology Dg Chest Portable 1 View  Result Date: 04/06/2019 CLINICAL DATA:  Shortness of breath, chest pain, fatigue, cough EXAM: PORTABLE CHEST 1 VIEW COMPARISON:  None. FINDINGS: Heart and mediastinal contours are within normal limits. No focal opacities or effusions. No acute bony abnormality. IMPRESSION: No active disease. Electronically Signed   By: Rolm Baptise M.D.   On: 04/06/2019 22:14    Procedures Procedures (including  critical care time)  Medications Ordered in ED Medications  iohexol (OMNIPAQUE) 350 MG/ML injection 100 mL (100 mLs Intravenous Contrast Given 04/06/19 2311)  sodium chloride (PF) 0.9 % injection (  Given by Other 04/06/19 2318)    CRITICAL CARE Performed by: Gwenyth Allegra Ryshawn Sanzone Total critical care time: 45 minutes Critical care time was exclusive of separately billable procedures and treating other patients. Critical care was necessary to treat or prevent imminent or life-threatening deterioration. Critical care was time spent personally by me on the following activities: development of treatment plan with patient and/or surrogate as well as nursing, discussions with consultants, evaluation of patient's response to treatment, examination of patient, obtaining history from patient or surrogate, ordering and performing treatments and interventions, ordering and review of laboratory studies, ordering and review of radiographic studies, pulse oximetry and re-evaluation of patient's condition.   Initial Impression / Assessment and Plan / ED Course  I have reviewed the triage vital signs and the nursing notes.  Pertinent labs & imaging results that were available during my care of the patient were reviewed by me and considered in my medical decision making (see chart for details).        Cassandra [name] is a 73 y.o. female with a past medical history significant for hypertension, hypothyroidism, prior breast cancer who presents with exertional shortness of breath, dry cough, and occasional chest pain.  She reports that for the last 2 weeks, she has had worsening exertional shortness breath.  She can only walk to and from her trashcan outside without difficulty but now is having to stop as she gets more winded.  She reports that the distances she is needing to rest have been getting shorter and shorter and she needs to rest longer.  She is never had this before.  She called her PCP who told her  to come to the emergency department given the worsening shortness of breath and occasional chest pains.  She reports he is occasionally having a tightness type pain across her chest.  She reports no fevers or chills but has had more fatigue as well as occasional cough.  She denies any leg pain or leg swelling or peripheral edema.  She denies recent medication changes.  Of note, patient was  found to be hypoxic with oxygen saturations in the 80s on room air on arrival.  As I was speaking to the patient, she was back on room air and again dropped to around 87% on room air.  We placed her on 2 L nasal cannula.  She denies any history of DVT or PE but does have a remote cancer history.  Patient reports that she had a coronavirus test performed last week and has not heard the results.  On exam, lungs are clear and chest is nontender.  Abdomen is nontender.  Legs are nonedematous and have normal pulses.  No CVA or back tenderness.  No murmur on my exam.    Due to the patient's hypoxia, she will need work-up to look for infection with a cough, will recheck her coronavirus, will also get work-up to look for a cardiac or pulmonary cause.  We will get a d-dimer and troponins.  Due to the new hypoxia, she will likely admission regardless of what we find.  Anticipate reassessment after work-up.  11:35 PM Coronavirus test was negative.  Initial troponin was negative however BNP is elevated at over 800.  TSH CBC and CMP overall reassuring.  D-dimer was elevated at 7.1 and a PE study was ordered.   PE study revealed large bilateral pulmonary emboli with evidence of significant heart strain.  Critical care was called who are going to discuss the plan and call back to find the level of care she will need.  Heparin was ordered, patient will be admitted.  11:45 PM Critical care called back and feel that patient is appropriate for hospitalist admission with pulmonary team in consultation to the ICU.  Hospitalist team  called for admission.   Final Clinical Impressions(s) / ED Diagnoses   Final diagnoses:  Acute pulmonary embolism, unspecified pulmonary embolism type, unspecified whether acute cor pulmonale present (HCC)  Hypoxia  Chest pain, unspecified type  Elevated brain natriuretic peptide (BNP) level    ED Discharge Orders    None     Clinical Impression: 1. Acute pulmonary embolism, unspecified pulmonary embolism type, unspecified whether acute cor pulmonale present ([place])   2. Hypoxia   3. Chest pain, unspecified type   4. Elevated brain natriuretic peptide (BNP) level     Disposition: Admit  This note was prepared with assistance of Dragon voice recognition software. Occasional wrong-word or sound-a-like substitutions may have occurred due to the inherent limitations of voice recognition software.     Beatriz Quintela, Gwenyth Allegra, MD 04/06/19 [phone]

## 2019-04-06 NOTE — ED Triage Notes (Signed)
Pt reports increasing shortness of breath over the last several days with dyspnea on exertion. Pt at 88% on room air at time of triage.

## 2019-04-07 ENCOUNTER — Encounter (HOSPITAL_COMMUNITY): Payer: Self-pay | Admitting: Internal Medicine

## 2019-04-07 ENCOUNTER — Inpatient Hospital Stay (HOSPITAL_COMMUNITY): Payer: Medicare HMO

## 2019-04-07 ENCOUNTER — Telehealth: Payer: Self-pay | Admitting: *Deleted

## 2019-04-07 DIAGNOSIS — E039 Hypothyroidism, unspecified: Secondary | ICD-10-CM | POA: Diagnosis present

## 2019-04-07 DIAGNOSIS — I2699 Other pulmonary embolism without acute cor pulmonale: Principal | ICD-10-CM

## 2019-04-07 DIAGNOSIS — I10 Essential (primary) hypertension: Secondary | ICD-10-CM | POA: Diagnosis present

## 2019-04-07 DIAGNOSIS — I361 Nonrheumatic tricuspid (valve) insufficiency: Secondary | ICD-10-CM

## 2019-04-07 DIAGNOSIS — E876 Hypokalemia: Secondary | ICD-10-CM

## 2019-04-07 DIAGNOSIS — R945 Abnormal results of liver function studies: Secondary | ICD-10-CM

## 2019-04-07 LAB — COMPREHENSIVE METABOLIC PANEL
ALT: 61 U/L — ABNORMAL HIGH (ref 0–44)
AST: 34 U/L (ref 15–41)
Albumin: 3.6 g/dL (ref 3.5–5.0)
Alkaline Phosphatase: 109 U/L (ref 38–126)
Anion gap: 14 (ref 5–15)
BUN: 20 mg/dL (ref 8–23)
CO2: 22 mmol/L (ref 22–32)
Calcium: 9.3 mg/dL (ref 8.9–10.3)
Chloride: 104 mmol/L (ref 98–111)
Creatinine, Ser: 0.86 mg/dL (ref 0.44–1.00)
GFR calc Af Amer: >60 mL/min (ref >60)
GFR calc non Af Amer: >60 mL/min (ref >60)
Glucose, Bld: 90 mg/dL (ref 70–99)
Potassium: 3.5 mmol/L (ref 3.5–5.1)
Sodium: 140 mmol/L (ref 135–145)
Total Bilirubin: 0.6 mg/dL (ref 0.3–1.2)
Total Protein: 7 g/dL (ref 6.5–8.1)

## 2019-04-07 LAB — CBC
HCT: 41 % (ref 36.0–46.0)
Hemoglobin: 12.9 g/dL (ref 12.0–15.0)
MCH: 30.9 pg (ref 26.0–34.0)
MCHC: 31.5 g/dL (ref 30.0–36.0)
MCV: 98.1 fL (ref 80.0–100.0)
Platelets: 300 10*3/uL (ref 150–400)
RBC: 4.18 MIL/uL (ref 3.87–5.11)
RDW: 14.9 % (ref 11.5–15.5)
WBC: 9.4 10*3/uL (ref 4.0–10.5)
nRBC: 0 % (ref 0.0–0.2)

## 2019-04-07 LAB — URINALYSIS, ROUTINE W REFLEX MICROSCOPIC
Bilirubin Urine: NEGATIVE
Glucose, UA: NEGATIVE mg/dL
Hgb urine dipstick: NEGATIVE
Ketones, ur: NEGATIVE mg/dL
Leukocytes,Ua: NEGATIVE
Nitrite: NEGATIVE
Protein, ur: NEGATIVE mg/dL
Specific Gravity, Urine: >1.046 — ABNORMAL HIGH (ref 1.005–1.030)
pH: 5 (ref 5.0–8.0)

## 2019-04-07 LAB — TROPONIN I (HIGH SENSITIVITY): Troponin I (High Sensitivity): 11 ng/L (ref <18)

## 2019-04-07 LAB — PROTIME-INR
INR: 1.3 — ABNORMAL HIGH (ref 0.8–1.2)
Prothrombin Time: 16.1 seconds — ABNORMAL HIGH (ref 11.4–15.2)

## 2019-04-07 LAB — ECHOCARDIOGRAM COMPLETE
Height: 61 in
Weight: 3135.82 oz

## 2019-04-07 LAB — MRSA PCR SCREENING: MRSA by PCR: NEGATIVE

## 2019-04-07 LAB — CK TOTAL AND CKMB (NOT AT ARMC)
CK, MB: 3.7 ng/mL (ref 0.5–5.0)
Relative Index: INVALID (ref 0.0–2.5)
Total CK: 90 U/L (ref 38–234)

## 2019-04-07 LAB — HEPARIN LEVEL (UNFRACTIONATED)
Heparin Unfractionated: 1.2 IU/mL — ABNORMAL HIGH (ref 0.30–0.70)
Heparin Unfractionated: 1.16 IU/mL — ABNORMAL HIGH (ref 0.30–0.70)

## 2019-04-07 LAB — APTT: aPTT: 156 seconds — ABNORMAL HIGH (ref 24–36)

## 2019-04-07 MED ORDER — HEPARIN BOLUS VIA INFUSION
4000.0000 [IU] | Freq: Once | INTRAVENOUS | Status: AC
  Administered 2019-04-07: 4000 [IU] via INTRAVENOUS
  Filled 2019-04-07: qty 4000

## 2019-04-07 MED ORDER — HEPARIN (PORCINE) 25000 UT/250ML-% IV SOLN
1350.0000 [IU]/h | INTRAVENOUS | Status: DC
  Administered 2019-04-07: 1350 [IU]/h via INTRAVENOUS
  Filled 2019-04-07: qty 250

## 2019-04-07 MED ORDER — BISOPROLOL-HYDROCHLOROTHIAZIDE 10-6.25 MG PO TABS
1.0000 | ORAL_TABLET | Freq: Every morning | ORAL | Status: DC
  Administered 2019-04-07 – 2019-04-08 (×2): 1 via ORAL
  Filled 2019-04-07 (×2): qty 1

## 2019-04-07 MED ORDER — APIXABAN 5 MG PO TABS: 5.0000 mg | ORAL_TABLET | Freq: Two times a day (BID) | ORAL | Status: DC

## 2019-04-07 MED ORDER — SODIUM CHLORIDE 0.9 % IV SOLN: INTRAVENOUS | Status: DC

## 2019-04-07 MED ORDER — LORATADINE 10 MG PO TABS
10.0000 mg | ORAL_TABLET | Freq: Every morning | ORAL | Status: DC
  Administered 2019-04-07 – 2019-04-08 (×2): 10 mg via ORAL
  Filled 2019-04-07 (×2): qty 1

## 2019-04-07 MED ORDER — ALBUTEROL SULFATE (2.5 MG/3ML) 0.083% IN NEBU: 2.5000 mg | INHALATION_SOLUTION | RESPIRATORY_TRACT | Status: DC | PRN

## 2019-04-07 MED ORDER — ATORVASTATIN CALCIUM 40 MG PO TABS
80.0000 mg | ORAL_TABLET | Freq: Every evening | ORAL | Status: DC
  Administered 2019-04-07: 80 mg via ORAL
  Filled 2019-04-07 (×2): qty 2

## 2019-04-07 MED ORDER — BENAZEPRIL HCL 20 MG PO TABS
40.0000 mg | ORAL_TABLET | Freq: Every day | ORAL | Status: DC
  Administered 2019-04-07: 40 mg via ORAL
  Filled 2019-04-07: qty 2

## 2019-04-07 MED ORDER — HEPARIN (PORCINE) 25000 UT/250ML-% IV SOLN: 1050.0000 [IU]/h | INTRAVENOUS | Status: DC

## 2019-04-07 MED ORDER — APIXABAN 5 MG PO TABS
10.0000 mg | ORAL_TABLET | Freq: Two times a day (BID) | ORAL | Status: DC
  Administered 2019-04-07 – 2019-04-08 (×3): 10 mg via ORAL
  Filled 2019-04-07 (×3): qty 2

## 2019-04-07 MED ORDER — CHLORHEXIDINE GLUCONATE CLOTH 2 % EX PADS
6.0000 | MEDICATED_PAD | Freq: Every day | CUTANEOUS | Status: DC
  Administered 2019-04-07 – 2019-04-08 (×2): 6 via TOPICAL

## 2019-04-07 MED ORDER — ORAL CARE MOUTH RINSE
15.0000 mL | Freq: Two times a day (BID) | OROMUCOSAL | Status: DC
  Administered 2019-04-07 – 2019-04-08 (×4): 15 mL via OROMUCOSAL

## 2019-04-07 MED ORDER — ALBUTEROL SULFATE HFA 108 (90 BASE) MCG/ACT IN AERS: 2.0000 | INHALATION_SPRAY | RESPIRATORY_TRACT | Status: DC | PRN

## 2019-04-07 MED ORDER — NIFEDIPINE ER OSMOTIC RELEASE 30 MG PO TB24
30.0000 mg | ORAL_TABLET | Freq: Every day | ORAL | Status: DC
  Administered 2019-04-07 – 2019-04-08 (×2): 30 mg via ORAL
  Filled 2019-04-07 (×2): qty 1

## 2019-04-07 MED ORDER — LEVOTHYROXINE SODIUM 100 MCG PO TABS
100.0000 ug | ORAL_TABLET | Freq: Every day | ORAL | Status: DC
  Administered 2019-04-07 – 2019-04-08 (×2): 100 ug via ORAL
  Filled 2019-04-07 (×2): qty 1

## 2019-04-07 NOTE — Progress Notes (Signed)
PROGRESS NOTE  Cassandra [name]  CWC:[id number]  DOB: 1946-06-12  DOA: 04/06/2019 PCP: Harlan Stains, MD  Brief Admission Hx: 73 year old female with hypertension, hyperlipidemia, hypothyroidism, history of breast cancer and history of diverticulitis presented with shortness of breath that she assumed was Covid 19 but it turned out to be a submassive PE.   MDM/Assessment & Plan:   1. Submassive PE-appreciate pulmonary consultation.  The patient is being treated with IV anticoagulation with heparin with plans to transition to NOAC.  The patient is being monitored in the stepdown unit.  A 2D echocardiogram is pending.  The patient did have some right heart strain.  The patient is mostly asymptomatic and had not required oxygen.  Pulmonary says that she may be able to discharge home tomorrow with outpatient follow-up. 2. Hypokalemia- oral repletion ordered, repeat in a.m. 3. Hypothyroidism-resume home levothyroxine. 4. Essential hypertension-blood pressures well controlled on current medications. 5. Mild ALT elevation-suspect hepatic steatosis.  Follow-up outpatient.  DVT prophylaxis: IV heparin infusion Code Status: Full Family Communication: Patient updated at bedside / telephone to emergency contact: no answer Disposition Plan: Continue to monitor in stepdown unit, possible DC home 04/08/2019   Consultants:  PCCM pulmonology  Procedures:  N/A  Antimicrobials:  N/A  Subjective: Patient without complaints no chest pain and no shortness of breath at this time.  Objective: Vitals:   04/07/19 1200 04/07/19 1300 04/07/19 1400 04/07/19 1500  BP: (!) 107/54 (!) 94/57 126/74 109/71  Pulse: 75 81 75 (!) 25  Resp:      Temp:      TempSrc:      SpO2: (!) 88% 97% (!) 88% (!) 86%  Weight:      Height:        Intake/Output Summary (Last 24 hours) at 04/07/2019 1534 Last data filed at 04/07/2019 1300 Gross per 24 hour  Intake 580.89 ml  Output 350 ml  Net 230.89 ml   Filed  Weights   04/06/19 2056 04/07/19 0310  Weight: 88.9 kg 88.9 kg     REVIEW OF SYSTEMS  As per history otherwise all reviewed and reported negative  Exam:  General exam: Awake, alert, no apparent distress, cooperative and pleasant Respiratory system: Clear. No increased work of breathing. Cardiovascular system: S1 & S2 heard. No JVD, murmurs, gallops, clicks or pedal edema. Gastrointestinal system: Abdomen is nondistended, soft and nontender. Normal bowel sounds heard. Central nervous system: Alert and oriented. No focal neurological deficits. Extremities: no CCE.  Data Reviewed: Basic Metabolic Panel: Recent Labs  Lab 04/06/19 2142 04/07/19 0352  NA 140 140  K 3.3* 3.5  CL 105 104  CO2 23 22  GLUCOSE 101* 90  BUN 22 20  CREATININE 0.92 0.86  CALCIUM 9.7 9.3   Liver Function Tests: Recent Labs  Lab 04/06/19 2142 04/07/19 0352  AST 40 34  ALT 67* 61*  ALKPHOS 114 109  BILITOT 0.5 0.6  PROT 7.3 7.0  ALBUMIN 3.8 3.6   Recent Labs  Lab 04/06/19 2142  LIPASE 49   No results for input(s): AMMONIA in the last 168 hours. CBC: Recent Labs  Lab 04/06/19 2142 04/07/19 0352  WBC 9.2 9.4  NEUTROABS 5.1  --   HGB 13.0 12.9  HCT 41.1 41.0  MCV 95.8 98.1  PLT 364 300   Cardiac Enzymes: Recent Labs  Lab 04/07/19 0352  CKTOTAL 90  CKMB 3.7   CBG (last 3)  No results for input(s): GLUCAP in the last 72 hours. Recent Results (from  the past 240 hour(s))  SARS Coronavirus 2 (CEPHEID- Performed in [facility] hospital lab), Hosp Order     Status: None   Collection Time: 04/06/19 10:13 PM   Specimen: Nasopharyngeal Swab  Result Value Ref Range Status   SARS Coronavirus 2 NEGATIVE NEGATIVE Final    Comment: (NOTE) If result is NEGATIVE SARS-CoV-2 target nucleic acids are NOT DETECTED. The SARS-CoV-2 RNA is generally detectable in upper and lower  respiratory specimens during the acute phase of infection. The lowest  concentration of SARS-CoV-2 viral copies  this assay can detect is 250  copies / mL. A negative result does not preclude SARS-CoV-2 infection  and should not be used as the sole basis for treatment or other  patient management decisions.  A negative result may occur with  improper specimen collection / handling, submission of specimen other  than nasopharyngeal swab, presence of viral mutation(s) within the  areas targeted by this assay, and inadequate number of viral copies  (<250 copies / mL). A negative result must be combined with clinical  observations, patient history, and epidemiological information. If result is POSITIVE SARS-CoV-2 target nucleic acids are DETECTED. The SARS-CoV-2 RNA is generally detectable in upper and lower  respiratory specimens dur ing the acute phase of infection.  Positive  results are indicative of active infection with SARS-CoV-2.  Clinical  correlation with patient history and other diagnostic information is  necessary to determine patient infection status.  Positive results do  not rule out bacterial infection or co-infection with other viruses. If result is PRESUMPTIVE POSTIVE SARS-CoV-2 nucleic acids MAY BE PRESENT.   A presumptive positive result was obtained on the submitted specimen  and confirmed on repeat testing.  While 2019 novel coronavirus  (SARS-CoV-2) nucleic acids may be present in the submitted sample  additional confirmatory testing may be necessary for epidemiological  and / or clinical management purposes  to differentiate between  SARS-CoV-2 and other Sarbecovirus currently known to infect humans.  If clinically indicated additional testing with an alternate test  methodology [id number]) is advised. The SARS-CoV-2 RNA is generally  detectable in upper and lower respiratory sp ecimens during the acute  phase of infection. The expected result is Negative. Fact Sheet for Patients:  StrictlyIdeas.no Fact Sheet for Healthcare Providers:  BankingDealers.co.za This test is not yet approved or cleared by the [place] FDA and has been authorized for detection and/or diagnosis of SARS-CoV-2 by FDA under an Emergency Use Authorization (EUA).  This EUA will remain in effect (meaning this test can be used) for the duration of the COVID-19 declaration under Section 564(b)(1) of the Act, 21 U.S.C. section 360bbb-3(b)(1), unless the authorization is terminated or revoked sooner. Performed at [facility], [place] [address] [place], [place] [address]   MRSA PCR Screening     Status: None   Collection Time: 04/07/19  3:04 AM   Specimen: Nasal Mucosa; Nasopharyngeal  Result Value Ref Range Status   MRSA by PCR NEGATIVE NEGATIVE Final    Comment:        The GeneXpert MRSA Assay (FDA approved for NASAL specimens only), is one component of a comprehensive MRSA colonization surveillance program. It is not intended to diagnose MRSA infection nor to guide or monitor treatment for MRSA infections. Performed at [facility], [place] [address], [place], [place] [address]      Studies: Ct Angio Chest Pe W And/or Wo Contrast  Result Date: 04/06/2019 CLINICAL DATA:  Increasing shortness of breath EXAM:  CT ANGIOGRAPHY CHEST WITH CONTRAST TECHNIQUE: Multidetector CT imaging of the chest was performed using the standard protocol during bolus administration of intravenous contrast. Multiplanar CT image reconstructions and MIPs were obtained to evaluate the vascular anatomy. CONTRAST:  154mL OMNIPAQUE IOHEXOL 350 MG/ML SOLN COMPARISON:  For 02/2006 report. FINDINGS: Cardiovascular: Extensive bilateral pulmonary emboli are noted involving all lobes of both lungs, right greater than left. RV to LV ratio is 1.8, significantly elevated compatible with right heart strain. Heart is upper limits normal in size. Mediastinum/Nodes: No mediastinal, hilar, or axillary adenopathy. Lungs/Pleura: Trace  right pleural effusion. Patchy ground-glass opacities in the posterior left upper lobe, anteromedial right middle lobe which could reflect early infarcts. Upper Abdomen: Imaging into the upper abdomen shows no acute findings. Musculoskeletal: Chest wall soft tissues are unremarkable. No acute bony abnormality. Review of the MIP images confirms the above findings. IMPRESSION: Positive for acute PE with CT evidence of right heart strain (RV/LV Ratio = 1.8) consistent with at least submassive (intermediate risk) PE. The presence of right heart strain has been associated with an increased risk of morbidity and mortality. Please activate Code PE by paging [phone]. Critical Value/emergent results were called by telephone at the time of interpretation on 04/06/2019 at 11:37 pm to Dr. Marda Stalker , who verbally acknowledged these results. Electronically Signed   By: Rolm Baptise M.D.   On: 04/06/2019 23:37   Dg Chest Portable 1 View  Result Date: 04/06/2019 CLINICAL DATA:  Shortness of breath, chest pain, fatigue, cough EXAM: PORTABLE CHEST 1 VIEW COMPARISON:  None. FINDINGS: Heart and mediastinal contours are within normal limits. No focal opacities or effusions. No acute bony abnormality. IMPRESSION: No active disease. Electronically Signed   By: Rolm Baptise M.D.   On: 04/06/2019 22:14   Scheduled Meds: . apixaban  10 mg Oral BID   Followed by  . [START ON 04/14/2019] apixaban  5 mg Oral BID  . atorvastatin  80 mg Oral QPM  . benazepril  40 mg Oral QHS  . bisoprolol-hydrochlorothiazide  1 tablet Oral q morning - 10a  . Chlorhexidine Gluconate Cloth  6 each Topical Daily  . levothyroxine  100 mcg Oral Q0600  . loratadine  10 mg Oral q morning - 10a  . mouth rinse  15 mL Mouth Rinse BID  . NIFEdipine  30 mg Oral QPC breakfast   Continuous Infusions: . sodium chloride 10 mL/hr at 04/07/19 0820    Principal Problem:   Pulmonary embolism (HCC) Active Problems:   Abnormal liver function    Hypokalemia   Hypothyroidism   Essential hypertension  Critical Care Time spent: 30 minutes  Irwin Brakeman, MD [facility] 04/07/2019, 3:34 PM    LOS: 1 day  How to contact the [facility] Attending or Consulting provider [place] or covering provider during after hours [place], for this patient?  1. Check the care team in [facility] and look for a) attending/consulting [facility] provider listed and b) the [facility] team listed 2. Log into [email or web address] and use [place]'s universal password to access. If you do not have the password, please contact the hospital operator. 3. Locate the [facility] provider you are looking for under [facility] and page to a number that you can be directly reached. 4. If you still have difficulty reaching the provider, please page the [facility] (Director on Call) for the Hospitalists listed on amion for assistance.

## 2019-04-07 NOTE — Plan of Care (Signed)
  Problem: Education: Goal: Knowledge of General Education information will improve Description Including pain rating scale, medication(s)/side effects and non-pharmacologic comfort measures Outcome: Progressing   

## 2019-04-07 NOTE — Progress Notes (Signed)
Patient sleeping soundly and experiencing significant oxygen desaturation. Patient was on room air while awake, with O2 94%. Once deeply sleeping, O2 dropped to 84%. Oxygen applied and slowly increased while patient sleeping up to 6L/minute with sats still upper 80s. Suggestive of sleep apnea. Will continue to monitor and wean oxygen as able.

## 2019-04-07 NOTE — Discharge Instructions (Signed)
Information on my medicine - ELIQUIS (apixaban)  Why was Eliquis prescribed for you? Eliquis was prescribed to treat blood clots that may have been found in the veins of your legs (deep vein thrombosis) or in your lungs (pulmonary embolism) and to reduce the risk of them occurring again.  What do You need to know about Eliquis ? The starting dose is 10 mg (two 5 mg tablets) taken TWICE daily for the FIRST SEVEN (7) DAYS, then on (enter date)  7/21  the dose is reduced to ONE 5 mg tablet taken TWICE daily.  Eliquis may be taken with or without food.   Try to take the dose about the same time in the morning and in the evening. If you have difficulty swallowing the tablet whole please discuss with your pharmacist how to take the medication safely.  Take Eliquis exactly as prescribed and DO NOT stop taking Eliquis without talking to the doctor who prescribed the medication.  Stopping may increase your risk of developing a new blood clot.  Refill your prescription before you run out.  After discharge, you should have regular check-up appointments with your healthcare provider that is prescribing your Eliquis.    What do you do if you miss a dose? If a dose of ELIQUIS is not taken at the scheduled time, take it as soon as possible on the same day and twice-daily administration should be resumed. The dose should not be doubled to make up for a missed dose.  Important Safety Information A possible side effect of Eliquis is bleeding. You should call your healthcare provider right away if you experience any of the following: ? Bleeding from an injury or your nose that does not stop. ? Unusual colored urine (red or dark brown) or unusual colored stools (red or black). ? Unusual bruising for unknown reasons. ? A serious fall or if you hit your head (even if there is no bleeding).  Some medicines may interact with Eliquis and might increase your risk of bleeding or clotting while on Eliquis.  To help avoid this, consult your healthcare provider or pharmacist prior to using any new prescription or non-prescription medications, including herbals, vitamins, non-steroidal anti-inflammatory drugs (NSAIDs) and supplements.  This website has more information on Eliquis (apixaban): http://[email or web address].com/eliquis/home

## 2019-04-07 NOTE — Consult Note (Signed)
NAME:  Cassandra [name], MRN:  [id number], DOB:  Aug 03, 1946, LOS: 1 ADMISSION DATE:  04/06/2019, CONSULTATION DATE:  04/07/19 REFERRING MD:  Hospitalist, CHIEF COMPLAINT:  DOE   Brief History   73 year old woman presenting with submassive PE.  History of present illness   73 year old woman with distant hx of breast cancer, obesity presenting with insidious onset worsening SOB over last several weeks.  Within past week associated with cough.  No relief with trial of albuterol.  No fevers, chills, sick contacts.  Imaging workup revealed PE with signs of right heart strain.  Denies hemoptysis, presyncope.  Cough is paroxysmal, dry, associated with occasional dizzy spells when she cannot stop.  Patient has never had clot in past.  No long car trips but does spend almost 3 hours in a tractor [place]  She also has an admittedly sedentary lifestyle since retiring from [employer].  Nonsmoker.  No history of clots in family.  No history of miscarriages.  Mammogram and colonoscopies up to date.  No concerning B symptoms.  Denies sleep apnea symptoms.  Past Medical History  HTN HLD Hypothyroidism Prediabetes Distant hx of breast cancer  Significant Hospital Events   7/13 Admitted  Consults:  7/14 PCCM  Procedures:  None  Significant Diagnostic Tests:  CTA PE, large pulmonary trunk, RV>LV, suggestion of early infarct on R  Micro Data:  NA  Antimicrobials:  None   Interim history/subjective:  Admitted  Objective   Blood pressure 125/75, pulse 67, temperature (!) 97.5 F (36.4 C), temperature source Oral, resp. rate 14, height 5\' 1"  (1.549 m), weight 88.9 kg, SpO2 96 %.        Intake/Output Summary (Last 24 hours) at 04/07/2019 0809 Last data filed at 04/07/2019 0700 Gross per 24 hour  Intake 480.21 ml  Output 75 ml  Net 405.21 ml   Filed Weights   04/06/19 2056 04/07/19 0310  Weight: 88.9 kg 88.9 kg    Examination: GEN: obese woman in NAD HEENT:  [place] 3 CV: RRR, ext warm PULM: Clear, no accessory muscle use GI: Soft, +BS EXT: No edema NEURO: Moves all 4 ext to command PSYCH: AOx3, excellent insight SKIN: no rashes   Resolved Hospital Problem list   NA  Assessment & Plan:  Submassive PE- high risk imaging characteristics include high RV to LV ratio.  Also technically high risk based by PESI score. However, clinically she is doing well, saturating in mid 90s on RA, hemodynamically normal.   No presyncopal symptoms.  Insidious onset in symptoms likely has allowed for adaptive cardiac response.  Because of this, I think we can get by with [facility] alone.  Will ask social worker which NoAC is covered with her HMO then would transition.  Would do at least 6 months therapy with consideration for lifelong.  Keep in hospital 1 more day, I will check on her tomorrow, if doing well can likely go home.  Will arrange f/u in pulmonary clinic in 3 months.  Labs   CBC: Recent Labs  Lab 04/06/19 2142 04/07/19 0352  WBC 9.2 9.4  NEUTROABS 5.1  --   HGB 13.0 12.9  HCT 41.1 41.0  MCV 95.8 98.1  PLT 364 761    Basic Metabolic Panel: Recent Labs  Lab 04/06/19 2142 04/07/19 0352  NA 140 140  K 3.3* 3.5  CL 105 104  CO2 23 22  GLUCOSE 101* 90  BUN 22 20  CREATININE 0.92  0.86  CALCIUM 9.7 9.3   GFR: Estimated Creatinine Clearance: 61.7 mL/min (by C-G formula based on SCr of 0.86 mg/dL). Recent Labs  Lab 04/06/19 2142 04/07/19 0352  WBC 9.2 9.4    Liver Function Tests: Recent Labs  Lab 04/06/19 2142 04/07/19 0352  AST 40 34  ALT 67* 61*  ALKPHOS 114 109  BILITOT 0.5 0.6  PROT 7.3 7.0  ALBUMIN 3.8 3.6   Recent Labs  Lab 04/06/19 2142  LIPASE 49   No results for input(s): AMMONIA in the last 168 hours.  ABG No results found for: PHART, PCO2ART, PO2ART, HCO3, TCO2, ACIDBASEDEF, O2SAT   Coagulation Profile: Recent Labs  Lab 04/07/19 0352  INR 1.3*    Cardiac Enzymes: No results for input(s): CKTOTAL,  CKMB, CKMBINDEX, TROPONINI in the last 168 hours.  HbA1C: Hgb A1c MFr Bld  Date/Time Value Ref Range Status  11/15/2015 02:45 PM 6.0 (H) 4.8 - 5.6 % Final    Comment:    (NOTE)         Pre-diabetes: 5.7 - 6.4         Diabetes: >6.4         Glycemic control for adults with diabetes: <7.0     CBG: No results for input(s): GLUCAP in the last 168 hours.  Review of Systems:    Positive Symptoms in bold:  Constitutional fevers, chills, weight loss, fatigue, anorexia, malaise  Eyes decreased vision, double vision, eye irritation  Ears, Nose, Mouth, Throat sore throat, trouble swallowing, sinus congestion  Cardiovascular chest pain, paroxysmal nocturnal dyspnea, lower ext edema, palpitations   Respiratory SOB, cough, DOE, hemoptysis, wheezing  Gastrointestinal nausea, vomiting, diarrhea  Genitourinary burning with urination, trouble urinating  Musculoskeletal joint aches, joint swelling, back pain  Integumentary  rashes, skin lesions  Neurological focal weakness, focal numbness, trouble speaking, headaches  Psychiatric depression, anxiety, confusion  Endocrine polyuria, polydipsia, cold intolerance, heat intolerance  Hematologic abnormal bruising, abnormal bleeding, unexplained nose bleeds  Allergic/Immunologic recurrent infections, hives, swollen lymph nodes     Past Medical History  She,  has a past medical history of Cancer ([place]), seasonal allergies, Hypertension, and Hypothyroidism.   Surgical History    Past Surgical History:  Procedure Laterality Date  . BREAST SURGERY Right    modified partial mastectomy/ lymph nodes dissection  . COLON SURGERY  2/17  . INCISIONAL HERNIA REPAIR N/A 04/06/2016   Procedure: LAPAROSCOPIC ASSISTED INCISIONAL HERNIA REPAIR WITH MESH;  Surgeon: Leighton Ruff, MD;  Location: [facility];  Service: General;  Laterality: N/A;  . TONSILLECTOMY       Social History   reports that she has never smoked. She has never used smokeless tobacco. She  reports current alcohol use. She reports that she does not use drugs.   Family History   Her family history includes COPD in her father.   Allergies No Known Allergies   Home Medications  Prior to Admission medications   Medication Sig Start Date End Date Taking? Authorizing Provider  albuterol (VENTOLIN HFA) 108 (90 Base) MCG/ACT inhaler Inhale 2 puffs into the lungs every 4 (four) hours as needed for wheezing or shortness of breath.  03/31/19  Yes [provider]  atorvastatin (LIPITOR) 80 MG tablet Take 80 mg by mouth every evening.   Yes [provider]  benazepril (LOTENSIN) 40 MG tablet Take 40 mg by mouth at bedtime.  03/16/16  Yes [provider]  bisoprolol-hydrochlorothiazide (ZIAC) 10-6.25 MG tablet Take 1 tablet by mouth every morning.  Yes [provider]  CALCIUM PO Take 1 tablet by mouth 2 (two) times daily.   Yes [provider]  Cholecalciferol (VITAMIN D3) 2000 units capsule Take 2,000 Units by mouth every morning.   Yes [provider]  Coenzyme Q10 (CO Q 10 PO) Take 1 tablet by mouth every morning.   Yes [provider]  ibuprofen (ADVIL,MOTRIN) 200 MG tablet Take 400 mg by mouth every 6 (six) hours as needed for moderate pain.   Yes [provider]  levothyroxine (SYNTHROID, LEVOTHROID) 100 MCG tablet Take 100 mcg by mouth daily before breakfast.   Yes [provider]  loratadine (CLARITIN) 10 MG tablet Take 10 mg by mouth every morning.   Yes [provider]  Lysine 500 MG CAPS Take 500 mg by mouth every morning.   Yes [provider]  Multiple Vitamin (MULTIVITAMIN WITH MINERALS) TABS tablet Take 1 tablet by mouth every morning.   Yes [provider]  NIFEdipine (ADALAT CC) 30 MG 24 hr tablet Take 30 mg by mouth daily after breakfast. 03/17/19  Yes [provider]  Omega-3 Fatty Acids (FISH OIL) 1000 MG CAPS Take 1,000 mg by mouth every morning. 06/26/12  Yes  [provider]  TURMERIC PO Take 1 tablet by mouth daily.   Yes [provider]

## 2019-04-07 NOTE — Progress Notes (Signed)
[place] for heparin  Indication: pulmonary embolus  No Known Allergies  Patient Measurements: Height: '5\' 1"'  (154.9 cm) Weight: 195 lb 15.8 oz (88.9 kg) IBW/kg (Calculated) : 47.8 Heparin Dosing Weight: 68.5 kg  Vital Signs: Temp: 98.1 F (36.7 C) (07/14 0800) Temp Source: Oral (07/14 0800) BP: 101/45 (07/14 1100) Pulse Rate: 73 (07/14 1100)  Labs: Recent Labs    04/06/19 2142 04/07/19 0352 04/07/19 0944  HGB 13.0 12.9  --   HCT 41.1 41.0  --   PLT 364 300  --   APTT  --  156*  --   LABPROT  --  16.1*  --   INR  --  1.3*  --   HEPARINUNFRC  --  1.20* 1.16*  CREATININE 0.92 0.86  --   CKTOTAL  --  90  --   CKMB  --  3.7  --   TROPONINIHS 11 11.0  --     Estimated Creatinine Clearance: 61.7 mL/min (by C-G formula based on SCr of 0.86 mg/dL).   Medical History: Past Medical History:  Diagnosis Date  . Cancer [facility])    Breast cancer right '06- surgery, chemo, radiation- released after 5 yrs  . Hx of seasonal allergies    OTC med used  . Hypertension   . Hypothyroidism     Medications:  Medications Prior to Admission  Medication Sig Dispense Refill Last Dose  . albuterol (VENTOLIN HFA) 108 (90 Base) MCG/ACT inhaler Inhale 2 puffs into the lungs every 4 (four) hours as needed for wheezing or shortness of breath.    04/06/2019 at Unknown time  . atorvastatin (LIPITOR) 80 MG tablet Take 80 mg by mouth every evening.   04/05/2019 at Unknown time  . benazepril (LOTENSIN) 40 MG tablet Take 40 mg by mouth at bedtime.    04/05/2019 at 2355  . bisoprolol-hydrochlorothiazide (ZIAC) 10-6.25 MG tablet Take 1 tablet by mouth every morning.   04/06/2019 at 1200  . CALCIUM PO Take 1 tablet by mouth 2 (two) times daily.   04/06/2019 at Unknown time  . Cholecalciferol (VITAMIN D3) 2000 units capsule Take 2,000 Units by mouth every morning.   04/06/2019 at Unknown time  . Coenzyme Q10 (CO Q 10 PO) Take 1 tablet by mouth every morning.   Past Week  at Unknown time  . ibuprofen (ADVIL,MOTRIN) 200 MG tablet Take 400 mg by mouth every 6 (six) hours as needed for moderate pain.   04/05/2019 at Unknown time  . levothyroxine (SYNTHROID, LEVOTHROID) 100 MCG tablet Take 100 mcg by mouth daily before breakfast.   04/06/2019 at Unknown time  . loratadine (CLARITIN) 10 MG tablet Take 10 mg by mouth every morning.   04/06/2019 at Unknown time  . Lysine 500 MG CAPS Take 500 mg by mouth every morning.   04/06/2019 at Unknown time  . Multiple Vitamin (MULTIVITAMIN WITH MINERALS) TABS tablet Take 1 tablet by mouth every morning.   04/06/2019 at Unknown time  . NIFEdipine (ADALAT CC) 30 MG 24 hr tablet Take 30 mg by mouth daily after breakfast.   04/06/2019 at Unknown time  . Omega-3 Fatty Acids (FISH OIL) 1000 MG CAPS Take 1,000 mg by mouth every morning.   Past Week at Unknown time  . TURMERIC PO Take 1 tablet by mouth daily.   04/06/2019 at Unknown time    Assessment: 73 yo F with submassive PE on heparin per pharmacy.  First heparin level drawn ~ 8 hrs after  4000 unit bolus and heparin drip at 1350 units/hr is elevated at 1.16.  Heparin level was drawn from the opposite arm of the heparin drip.  No bleeding reported. CBC stable. Clinically doing well.  CM reports co-pay for both Eliquis and Xarelto are $47 after deductible is met  Goal of Therapy:  Heparin level 0.3-0.7 units/ml Monitor platelets by anticoagulation protocol: Yes   Plan:  Hold heparin drip x 1 hour Resume at lower rate of 1050 units/hr and check 8 hr HL Daily CBC and HL while on heparin F/u transition to [place], Pharm.D [phone] 04/07/2019 12:05 PM

## 2019-04-07 NOTE — Progress Notes (Signed)
  Echocardiogram 2D Echocardiogram has been performed.  Bobbye Charleston 04/07/2019, 12:26 PM

## 2019-04-07 NOTE — Progress Notes (Signed)
ANTICOAGULATION CONSULT NOTE - Initial Consult  Pharmacy Consult for Heparin Indication: pulmonary embolus  No Known Allergies  Patient Measurements: Height: 5\' 1"  (154.9 cm) Weight: 196 lb (88.9 kg) IBW/kg (Calculated) : 47.8 Heparin Dosing Weight:   Vital Signs: Temp: 98.4 F (36.9 C) (07/13 2055) Temp Source: Oral (07/13 2055) BP: 144/100 (07/13 2230) Pulse Rate: 69 (07/13 2230)  Labs: Recent Labs    04/06/19 2142  HGB 13.0  HCT 41.1  PLT 364  CREATININE 0.92  TROPONINIHS 11    Estimated Creatinine Clearance: 57.7 mL/min (by C-G formula based on SCr of 0.92 mg/dL).   Medical History: Past Medical History:  Diagnosis Date  . Cancer [facility])    Breast cancer right '06- surgery, chemo, radiation- released after 5 yrs  . Hx of seasonal allergies    OTC med used  . Hypertension   . Hypothyroidism     Medications:  Infusions:  . sodium chloride    . heparin      Assessment: Patient with new PE noted in H&P.  No oral anticoagulants noted on med rec.  Baseline coags ordered.  Goal of Therapy:  Heparin level 0.3-0.7 units/ml Monitor platelets by anticoagulation protocol: Yes   Plan:  Heparin bolus  4000 units iv x1 Heparin drip at 1350 units/hr Daily CBC Next heparin level at 0900    Tyler Deis, Shea Stakes [place] 04/07/2019,12:01 AM

## 2019-04-07 NOTE — ED Notes (Signed)
Hans RN aware that blood wasn't able to be obtained

## 2019-04-07 NOTE — ED Notes (Signed)
ED TO INPATIENT HANDOFF REPORT  Name/Age/Gender Cassandra [name] 73 y.o. female  Code Status    Code Status Orders  (From admission, onward)         Start     Ordered   04/06/19 2355  Full code  Continuous     04/06/19 2356        Code Status History    Date Active Date Inactive Code Status Order ID Comments User Context   04/06/2016 1308 04/07/2016 1446 Full Code [id number]  Leighton Ruff, MD Inpatient   Advance Care Planning Activity    Advance Directive Documentation     Most Recent Value  Type of Advance Directive  Living will, Healthcare Power of Attorney  Pre-existing out of facility DNR order (yellow form or pink MOST form)  -  "MOST" Form in Place?  -      Home/SNF/Other Home  Chief Complaint SOB (Sent by Doctor)  Level of Care/Admitting Diagnosis ED Disposition    ED Disposition Condition Comment   [place]: [facility] [[id number]]  Level of Care: Stepdown [14]  Admit to SDU based on following criteria: Respiratory Distress:  Frequent assessment and/or intervention to maintain adequate ventilation/respiration, pulmonary toilet, and respiratory treatment.  Covid Evaluation: Asymptomatic Screening Protocol (No Symptoms)  Diagnosis: Pulmonary embolism [facility]) [[id number]]  Admitting Physician: Jani Gravel Renova  Attending Physician: Jani Gravel [phone]  Estimated length of stay: past midnight tomorrow  Certification:: I certify this patient will need inpatient services for at least 2 midnights  PT Class (Do Not Modify): Inpatient [101]  PT Acc Code (Do Not Modify): Private [1]       Medical History Past Medical History:  Diagnosis Date  . Cancer [facility])    Breast cancer right '06- surgery, chemo, radiation- released after 5 yrs  . Hx of seasonal allergies    OTC med used  . Hypertension   . Hypothyroidism     Allergies No Known Allergies  IV Location/Drains/Wounds Patient Lines/Drains/Airways Status   Active  Line/Drains/Airways    Name:   Placement date:   Placement time:   Site:   Days:   Peripheral IV 11/18/15 Right Hand   11/18/15    1154    Hand   1236   Peripheral IV 04/07/19 Left Antecubital   04/07/19    0100    Antecubital   less than 1   Incision (Closed) 11/18/15 Abdomen   11/18/15    1612     1236   Incision (Closed) 11/18/15 Abdomen   11/18/15    1612     1236   Incision (Closed) 04/06/16 Abdomen Other (Comment)   04/06/16    1128     1096   Incision - 2 Ports Abdomen Umbilicus;Upper Lower;Right   11/18/15    1345     1236   Incision - 2 Ports Abdomen Left;Upper Left;Lower   04/06/16    1000     1096   Incision - 3 Ports Abdomen Upper;Mid Lower;Right;Lateral Upper;Lower   11/18/15    1345     1236          Labs/Imaging Results for orders placed or performed during the hospital encounter of 04/06/19 (from the past 48 hour(s))  CBC with Differential     Status: None   Collection Time: 04/06/19  9:42 PM  Result Value Ref Range   WBC 9.2 4.0 - 10.5 K/uL   RBC 4.29 3.87 - 5.11 MIL/uL  Hemoglobin 13.0 12.0 - 15.0 g/dL   HCT 41.1 36.0 - 46.0 %   MCV 95.8 80.0 - 100.0 fL   MCH 30.3 26.0 - 34.0 pg   MCHC 31.6 30.0 - 36.0 g/dL   RDW 14.8 11.5 - 15.5 %   Platelets 364 150 - 400 K/uL   nRBC 0.0 0.0 - 0.2 %   Neutrophils Relative % 55 %   Neutro Abs 5.1 1.7 - 7.7 K/uL   Lymphocytes Relative 35 %   Lymphs Abs 3.2 0.7 - 4.0 K/uL   Monocytes Relative 8 %   Monocytes Absolute 0.8 0.1 - 1.0 K/uL   Eosinophils Relative 1 %   Eosinophils Absolute 0.1 0.0 - 0.5 K/uL   Basophils Relative 1 %   Basophils Absolute 0.1 0.0 - 0.1 K/uL   Immature Granulocytes 0 %   Abs Immature Granulocytes 0.02 0.00 - 0.07 K/uL    Comment: Performed at [facility], [place] [address] [place],  [place] [address]  Comprehensive metabolic panel     Status: Abnormal   Collection Time: 04/06/19  9:42 PM  Result Value Ref Range   Sodium 140 135 - 145 mmol/L   Potassium 3.3 (L) 3.5 - 5.1 mmol/L    Chloride 105 98 - 111 mmol/L   CO2 23 22 - 32 mmol/L   Glucose, Bld 101 (H) 70 - 99 mg/dL   BUN 22 8 - 23 mg/dL   Creatinine, Ser 0.92 0.44 - 1.00 mg/dL   Calcium 9.7 8.9 - 10.3 mg/dL   Total Protein 7.3 6.5 - 8.1 g/dL   Albumin 3.8 3.5 - 5.0 g/dL   AST 40 15 - 41 U/L   ALT 67 (H) 0 - 44 U/L   Alkaline Phosphatase 114 38 - 126 U/L   Total Bilirubin 0.5 0.3 - 1.2 mg/dL   GFR calc non Af Amer >60 >60 mL/min   GFR calc Af Amer >60 >60 mL/min   Anion gap 12 5 - 15    Comment: Performed at [facility], [place] [address], [place], [place] [address]  Troponin I (High Sensitivity)     Status: None   Collection Time: 04/06/19  9:42 PM  Result Value Ref Range   Troponin I (High Sensitivity) 11 <18 ng/L    Comment: (NOTE) Elevated high sensitivity troponin I (hsTnI) values and significant  changes across serial measurements may suggest ACS but many other  chronic and acute conditions are known to elevate hsTnI results.  Refer to the "Links" section for chest pain algorithms and additional  guidance. Performed at [facility], [place] [address], [place], [place] [address]   D-dimer, quantitative (not at [facility])     Status: Abnormal   Collection Time: 04/06/19  9:42 PM  Result Value Ref Range   D-Dimer, Quant 7.14 (H) 0.00 - 0.50 ug/mL-FEU    Comment: (NOTE) At the manufacturer cut-off of 0.50 ug/mL FEU, this assay has been documented to exclude PE with a sensitivity and negative predictive value of 97 to 99%.  At this time, this assay has not been approved by the FDA to exclude DVT/VTE. Results should be correlated with clinical presentation. Performed at [facility], [place] [address] [place], [place] [address]   Lipase, blood     Status: None   Collection Time: 04/06/19  9:42 PM  Result Value Ref Range   Lipase 49 11 - 51 U/L    Comment: Performed at [facility],  [place] [address] [place], [place] [address]   Brain natriuretic peptide     Status: Abnormal   Collection Time: 04/06/19 10:13 PM  Result Value Ref Range   B Natriuretic Peptide 862.8 (H) 0.0 - 100.0 pg/mL    Comment: Performed at [facility], [place] [address], [place], [place] [address]  SARS Coronavirus 2 (CEPHEID- Performed in [name]), Hosp Order     Status: None   Collection Time: 04/06/19 10:13 PM   Specimen: Nasopharyngeal Swab  Result Value Ref Range   SARS Coronavirus 2 NEGATIVE NEGATIVE    Comment: (NOTE) If result is NEGATIVE SARS-CoV-2 target nucleic acids are NOT DETECTED. The SARS-CoV-2 RNA is generally detectable in upper and lower  respiratory specimens during the acute phase of infection. The lowest  concentration of SARS-CoV-2 viral copies this assay can detect is 250  copies / mL. A negative result does not preclude SARS-CoV-2 infection  and should not be used as the sole basis for treatment or other  patient management decisions.  A negative result may occur with  improper specimen collection / handling, submission of specimen other  than nasopharyngeal swab, presence of viral mutation(s) within the  areas targeted by this assay, and inadequate number of viral copies  (<250 copies / mL). A negative result must be combined with clinical  observations, patient history, and epidemiological information. If result is POSITIVE SARS-CoV-2 target nucleic acids are DETECTED. The SARS-CoV-2 RNA is generally detectable in upper and lower  respiratory specimens dur ing the acute phase of infection.  Positive  results are indicative of active infection with SARS-CoV-2.  Clinical  correlation with patient history and other diagnostic information is  necessary to determine patient infection status.  Positive results do  not rule out bacterial infection or co-infection with other viruses. If result is PRESUMPTIVE POSTIVE SARS-CoV-2 nucleic acids MAY BE PRESENT.   A presumptive positive  result was obtained on the submitted specimen  and confirmed on repeat testing.  While 2019 novel coronavirus  (SARS-CoV-2) nucleic acids may be present in the submitted sample  additional confirmatory testing may be necessary for epidemiological  and / or clinical management purposes  to differentiate between  SARS-CoV-2 and other Sarbecovirus currently known to infect humans.  If clinically indicated additional testing with an alternate test  methodology [phone]) is advised. The SARS-CoV-2 RNA is generally  detectable in upper and lower respiratory sp ecimens during the acute  phase of infection. The expected result is Negative. Fact Sheet for Patients:  StrictlyIdeas.no Fact Sheet for Healthcare Providers: BankingDealers.co.za This test is not yet approved or cleared by the [place] FDA and has been authorized for detection and/or diagnosis of SARS-CoV-2 by FDA under an Emergency Use Authorization (EUA).  This EUA will remain in effect (meaning this test can be used) for the duration of the COVID-19 declaration under Section 564(b)(1) of the Act, 21 U.S.C. section 360bbb-3(b)(1), unless the authorization is terminated or revoked sooner. Performed at [facility], [place] [address], [place], [place] [address]   TSH     Status: None   Collection Time: 04/06/19 10:13 PM  Result Value Ref Range   TSH 1.776 0.350 - 4.500 uIU/mL    Comment: Performed by a 3rd Generation assay with a functional sensitivity of <=0.01 uIU/mL. Performed at [facility], [place] [address] [place], [place] [address]    Ct Angio Chest Pe W And/or Wo Contrast  Result Date: 04/06/2019 CLINICAL  DATA:  Increasing shortness of breath EXAM: CT ANGIOGRAPHY CHEST WITH CONTRAST TECHNIQUE: Multidetector CT imaging of the chest was performed using the standard protocol during bolus administration of intravenous contrast. Multiplanar CT  image reconstructions and MIPs were obtained to evaluate the vascular anatomy. CONTRAST:  184mL OMNIPAQUE IOHEXOL 350 MG/ML SOLN COMPARISON:  For 02/2006 report. FINDINGS: Cardiovascular: Extensive bilateral pulmonary emboli are noted involving all lobes of both lungs, right greater than left. RV to LV ratio is 1.8, significantly elevated compatible with right heart strain. Heart is upper limits normal in size. Mediastinum/Nodes: No mediastinal, hilar, or axillary adenopathy. Lungs/Pleura: Trace right pleural effusion. Patchy ground-glass opacities in the posterior left upper lobe, anteromedial right middle lobe which could reflect early infarcts. Upper Abdomen: Imaging into the upper abdomen shows no acute findings. Musculoskeletal: Chest wall soft tissues are unremarkable. No acute bony abnormality. Review of the MIP images confirms the above findings. IMPRESSION: Positive for acute PE with CT evidence of right heart strain (RV/LV Ratio = 1.8) consistent with at least submassive (intermediate risk) PE. The presence of right heart strain has been associated with an increased risk of morbidity and mortality. Please activate Code PE by paging [phone]. Critical Value/emergent results were called by telephone at the time of interpretation on 04/06/2019 at 11:37 pm to Dr. Marda Stalker , who verbally acknowledged these results. Electronically Signed   By: Rolm Baptise M.D.   On: 04/06/2019 23:37   Dg Chest Portable 1 View  Result Date: 04/06/2019 CLINICAL DATA:  Shortness of breath, chest pain, fatigue, cough EXAM: PORTABLE CHEST 1 VIEW COMPARISON:  None. FINDINGS: Heart and mediastinal contours are within normal limits. No focal opacities or effusions. No acute bony abnormality. IMPRESSION: No active disease. Electronically Signed   By: Rolm Baptise M.D.   On: 04/06/2019 22:14    Pending Labs Unresulted Labs (From admission, onward)    Start     Ordered   04/07/19 0900  Heparin level  (unfractionated)  Once-Timed,   STAT     04/07/19 0000   04/07/19 0500  Comprehensive metabolic panel  Tomorrow morning,   R     04/06/19 2356   04/07/19 0500  Hepatitis panel, acute  Tomorrow morning,   R     04/06/19 2357   04/07/19 0500  CBC  Daily,   R     04/07/19 0000   04/06/19 2357  CK total and CKMB (cardiac)not at [facility]  Add-on,   AD     04/06/19 2357   04/06/19 2355  HIV antibody (Routine Testing)  Once,   STAT     04/06/19 2356   04/06/19 2143  Urinalysis, Routine w reflex microscopic  Once,   STAT     04/06/19 2142          Vitals/Pain Today's Vitals   04/06/19 2230 04/07/19 0004 04/07/19 0030 04/07/19 0140  BP: (!) 144/100 (!) 151/106 (!) 148/91 (!) 145/79  Pulse: 69 76 65 75  Resp: 17 (!) 21 20 20   Temp:      TempSrc:      SpO2: 93% 97% 94% 94%  Weight:      Height:      PainSc:        Isolation Precautions No active isolations  Medications Medications  0.9 %  sodium chloride infusion ( Intravenous New Bag/Given 04/07/19 0133)  acetaminophen (TYLENOL) tablet 650 mg (has no administration in time range)    Or  acetaminophen (TYLENOL) suppository 650 mg (has no  administration in time range)  heparin ADULT infusion 100 units/mL (25000 units/217mL sodium chloride 0.45%) (1,350 Units/hr Intravenous New Bag/Given 04/07/19 0139)  iohexol (OMNIPAQUE) 350 MG/ML injection 100 mL (100 mLs Intravenous Contrast Given 04/06/19 2311)  sodium chloride (PF) 0.9 % injection (  Given by Other 04/06/19 2318)  heparin bolus via infusion 4,000 Units (4,000 Units Intravenous Bolus from Bag 04/07/19 0138)    Mobility walks

## 2019-04-07 NOTE — TOC Benefit Eligibility Note (Signed)
Transition of Care [facility]) Benefit Eligibility Note    Patient Details  Name: RAVEEN WIESELER MRN: [id number] Date of Birth: 08/14/1946   Medication/Dose: Eliquis 5mg  2x day for 30 days and Xarelto 20mg  daily for 30 days  Covered?: Yes  Tier: 3 Drug  Prescription Coverage Preferred Pharmacy: local pharmacy  Spoke with Person/Company/Phone Number:: Eileen/ [employer] [facility] [phone]  Co-Pay: Eliquis and Xarelto cost $47.00 generics not on formulary  Prior Approval: No  Deductible: Unmet(Patient has $157.00 deductable. Dyann Ruddle states copay is still $47.00)       Kerin Salen Phone Number: 04/07/2019, 11:49 AM

## 2019-04-07 NOTE — TOC Benefit Eligibility Note (Signed)
Transition of Care [facility]) Benefit Eligibility Note    Patient Details  Name: Cassandra [name] MRN: [id number] Date of Birth: [birth year]   Medication/Dose: Eliquis 5mg  2x day for 30 days and Xarelto 20mg  daily for 30 days  Covered?: Yes  Tier: 3 Drug  Prescription Coverage Preferred Pharmacy: local pharmacy  Spoke with Person/Company/Phone Number:: Eileen/ [employer] [facility] [phone]  Co-Pay: Eliquis and Xarelto cost $47.00 generics not on formulary  Prior Approval: No  Deductible: Unmet(Patient has $157.00 deductable. Dyann Ruddle states copay is still $47.00)       Kerin Salen Phone Number: 04/07/2019, 11:50 AM

## 2019-04-07 NOTE — Progress Notes (Addendum)
MD ok for lab draw from right arm, despite pink band, due to heparin drip and long ago history of lymphectomy. Relayed message to lab that ok to draw.

## 2019-04-07 NOTE — Telephone Encounter (Signed)
Error

## 2019-04-08 ENCOUNTER — Inpatient Hospital Stay (HOSPITAL_COMMUNITY): Payer: Medicare HMO

## 2019-04-08 DIAGNOSIS — I749 Embolism and thrombosis of unspecified artery: Secondary | ICD-10-CM

## 2019-04-08 DIAGNOSIS — I289 Disease of pulmonary vessels, unspecified: Secondary | ICD-10-CM

## 2019-04-08 DIAGNOSIS — I2699 Other pulmonary embolism without acute cor pulmonale: Secondary | ICD-10-CM

## 2019-04-08 LAB — BASIC METABOLIC PANEL
Anion gap: 14 (ref 5–15)
BUN: 19 mg/dL (ref 8–23)
CO2: 21 mmol/L — ABNORMAL LOW (ref 22–32)
Calcium: 9.1 mg/dL (ref 8.9–10.3)
Chloride: 106 mmol/L (ref 98–111)
Creatinine, Ser: 0.86 mg/dL (ref 0.44–1.00)
GFR calc Af Amer: >60 mL/min (ref >60)
GFR calc non Af Amer: >60 mL/min (ref >60)
Glucose, Bld: 99 mg/dL (ref 70–99)
Potassium: 3.8 mmol/L (ref 3.5–5.1)
Sodium: 141 mmol/L (ref 135–145)

## 2019-04-08 LAB — CBC
HCT: 41.9 % (ref 36.0–46.0)
Hemoglobin: 13.1 g/dL (ref 12.0–15.0)
MCH: 30 pg (ref 26.0–34.0)
MCHC: 31.3 g/dL (ref 30.0–36.0)
MCV: 96.1 fL (ref 80.0–100.0)
Platelets: 361 10*3/uL (ref 150–400)
RBC: 4.36 MIL/uL (ref 3.87–5.11)
RDW: 14.7 % (ref 11.5–15.5)
WBC: 10.3 10*3/uL (ref 4.0–10.5)
nRBC: 0 % (ref 0.0–0.2)

## 2019-04-08 LAB — BRAIN NATRIURETIC PEPTIDE: B Natriuretic Peptide: 934 pg/mL — ABNORMAL HIGH (ref 0.0–100.0)

## 2019-04-08 LAB — HEPATITIS PANEL, ACUTE
HCV Ab: <0.1 s/co ratio (ref 0.0–0.9)
Hep A IgM: NEGATIVE
Hep B C IgM: NEGATIVE
Hepatitis B Surface Ag: NEGATIVE

## 2019-04-08 LAB — HIV ANTIBODY (ROUTINE TESTING W REFLEX): HIV Screen 4th Generation wRfx: NONREACTIVE

## 2019-04-08 MED ORDER — ELIQUIS 5 MG VTE STARTER PACK: ORAL_TABLET | ORAL | 0 refills | Status: DC

## 2019-04-08 NOTE — TOC Transition Note (Signed)
Transition of Care [facility]) - CM/SW Discharge Note   Patient Details  Name: Cassandra [name] MRN: [id number] Date of Birth: 1946/03/06  [facility] CM/SW Contact:  Lynnell Catalan, RN Phone Number: 04/08/2019, 4:04 PM   Clinical Narrative:     Pt to dc home on 02. This CM contacted [employer] liaison for 02 delivery.  Marney Doctor RN,BSN [phone]

## 2019-04-08 NOTE — Progress Notes (Signed)
Bilateral lower extremity venous duplex has been completed. Preliminary results can be found in CV Proc through chart review.  Results were given to the patient's nurse, Raven.  04/08/19 9:07 AM Carlos Levering RVT

## 2019-04-08 NOTE — Discharge Summary (Addendum)
Physician Discharge Summary  Cassandra [name]:[id number] DOB: [birth year] DOA: 04/06/2019  PCP: Harlan Stains, MD  Admit date: 04/06/2019 Discharge date: 04/08/2019  Time spent: 40 minutes  Recommendations for Outpatient Follow-up:  1. Follow up outpatient CBC/CMP 2. Follow up oxygen needs as outpatient 3. Recommending outpatient sleep study 4. Recommend discussion with oncology regarding anticoagulation with her hx of malignancy  5. Follow final read of LE [place] (prelim was ready at time of discharge) 6. Needs repeat echo outpatient in a few months after treatment of PE  Discharge Diagnoses:  Principal Problem:   Pulmonary embolism ([place]) Active Problems:   Abnormal liver function   Hypokalemia   Hypothyroidism   Essential hypertension   Chronic thromboembolic disease (H[facility])   Pulmonary vascular disease ([place])   Discharge Condition: stable  Diet recommendation: heart healthy  Filed Weights   04/06/19 2056 04/07/19 0310  Weight: 88.9 kg 88.9 kg    History of present illness:  73 year old female with hypertension, hyperlipidemia, hypothyroidism, history of breast cancer and history of diverticulitis presented with shortness of breath that she assumed was Covid 19 but it turned out to be a submassive PE.   She was treated for submassive PE with IV heparin and transitioned to eliquis.  She was seen by pulmonology while inpatient.  See below for additional [facility] Course:  1. Submassive PE 1. CT with evidence of R heart strain 2. Echo with normal EF, impaired relaxation.  Moderately reduced SF of RV.  Moderate TR with severe pulm HTN.  (See report) 3. LE [place] with L popliteal vein DVT 4. S/p heparin, transitioned to eliquis today - planning for at least 6 months therapy and then discussion with PCP/pulm/oncology regarding long term anticoagulation recommendations. 5. Desatting at rest and when asleep, will discharge with home O2 - will need outpatient follow up to  determine if she will need this long term  Suspected sleep apnea: discussed with pt, needs sleep study outpatient  2. Hypokalemia- oral repletion ordered, repeat in a.m. 3. Hypothyroidism-resume home levothyroxine. 4. Essential hypertension-blood pressures well controlled on current medications. 5. Mild ALT elevation-suspect hepatic steatosis.  Follow-up outpatient.  Procedures: Echo IMPRESSIONS    1. The left ventricle has normal systolic function with an ejection fraction of 60-65%. The cavity size was normal. There is moderately increased left ventricular wall thickness. Left ventricular diastolic Doppler parameters are consistent with impaired  relaxation. Indeterminate filling pressures The E/e' is 8-15. No evidence of left ventricular regional wall motion abnormalities.  2. The right ventricle has moderately reduced systolic function. The cavity was moderately enlarged. There is no increase in right ventricular wall thickness.  3. Left atrial size was mildly dilated.  4. Right atrial size was moderately dilated.  5. The mitral valve is grossly normal.  6. The tricuspid valve is grossly normal. Tricuspid valve regurgitation is moderate.  7. The aortic valve was not well visualized. Moderate calcification of the aortic valve. No stenosis of the aortic valve.  8. The inferior vena cava was dilated in size with <50% respiratory variability.  9. The interatrial septum was not well visualized.  SUMMARY   LVEF 60-65%, moderate LVH, normal wall motion, grade 1 DD, indeterminate LV filling pressure, moderately dilated RV with moderate systolic dysfunction and a hyperdynamic apex (McConnell's sign), moderate RAE and moderate TR with severe pulmonary hypertension (RVSP 78 mmHg) - all suggestive of hemodynamically significant pulmonary embolus, dilated IVC that does not collapse  LE [place] Summary: Right: There is no  evidence of deep vein thrombosis in the lower extremity. No cystic  structure found in the popliteal fossa. Left: Findings consistent with acute deep vein thrombosis involving the left popliteal vein. No cystic structure found in the popliteal fossa.  Consultations:  Pulm  Discharge Exam: Vitals:   04/08/19 0930 04/08/19 1000  BP:    Pulse:    Resp:    Temp:    SpO2: 98% 98%   Feels well Ready to go home  General: No acute distress. Cardiovascular: Heart sounds show a regular rate, and rhythm. Lungs: Clear to auscultation bilaterally  Abdomen: Soft, nontender, nondistended  Neurological: Alert and oriented 3. Moves all extremities 4. Cranial nerves II through XII grossly intact. Skin: Warm and dry. No rashes or lesions.  Discharge Instructions   Discharge Instructions    Call MD for:  difficulty breathing, headache or visual disturbances   Complete by: As directed    Call MD for:  extreme fatigue   Complete by: As directed    Call MD for:  hives   Complete by: As directed    Call MD for:  persistant dizziness or light-headedness   Complete by: As directed    Call MD for:  persistant nausea and vomiting   Complete by: As directed    Call MD for:  redness, tenderness, or signs of infection (pain, swelling, redness, odor or green/yellow discharge around incision site)   Complete by: As directed    Call MD for:  severe uncontrolled pain   Complete by: As directed    Call MD for:  temperature >100.4   Complete by: As directed    Diet - low sodium heart healthy   Complete by: As directed    Diet - low sodium heart healthy   Complete by: As directed    Discharge instructions   Complete by: As directed    You were seen for a blood clot in your lungs.  You also had a blood clot in your left leg.  You've been started on a blood thinner called eliquis.  This should be continued for at least 3-6 months.  I would recommend following back up with your oncologist to see if they have any recommendations on the duration for you.  Follow up with  your PCP or oncologist to discuss long term treatment plan for your blood clot.  You should have a repeat echocardiogram in a few months (your blood clot showed some effect on the heart as well and this should be followed outpatient).  You should follow up with pulmonology in about 3 months.  You need an outpatient sleep study.  Your oxygen level drops at night which could be because of sleep apnea.  Please discuss this with your primary care doctor.  We are sending you home with supplemental oxygen.  Please use this all the time (at rest and with activity).  You'll need to follow up with your PCP or the lung doctors to determine the long term plan for this.  Return for new, recurrent, or worsening symptoms.  Please ask your PCP to request records from this hospitalization so they know what was done and what the next steps will be.   Increase activity slowly   Complete by: As directed    Increase activity slowly   Complete by: As directed      Allergies as of 04/08/2019   No Known Allergies     Medication List    STOP taking these medications  ibuprofen 200 MG tablet Commonly known as: ADVIL     TAKE these medications   albuterol 108 (90 Base) MCG/ACT inhaler Commonly known as: VENTOLIN HFA Inhale 2 puffs into the lungs every 4 (four) hours as needed for wheezing or shortness of breath.   atorvastatin 80 MG tablet Commonly known as: LIPITOR Take 80 mg by mouth every evening.   benazepril 40 MG tablet Commonly known as: LOTENSIN Take 40 mg by mouth at bedtime.   bisoprolol-hydrochlorothiazide 10-6.25 MG tablet Commonly known as: ZIAC Take 1 tablet by mouth every morning.   CALCIUM PO Take 1 tablet by mouth 2 (two) times daily.   CO Q 10 PO Take 1 tablet by mouth every morning.   Eliquis DVT/PE Starter Pack 5 MG Tabs Take as directed on package: start with two-5mg  tablets twice daily for 7 days. On day 8, switch to one-5mg  tablet twice daily.   Fish Oil 1000 MG  Caps Take 1,000 mg by mouth every morning.   levothyroxine 100 MCG tablet Commonly known as: SYNTHROID Take 100 mcg by mouth daily before breakfast.   loratadine 10 MG tablet Commonly known as: CLARITIN Take 10 mg by mouth every morning.   Lysine 500 MG Caps Take 500 mg by mouth every morning.   multivitamin with minerals Tabs tablet Take 1 tablet by mouth every morning.   NIFEdipine 30 MG 24 hr tablet Commonly known as: ADALAT CC Take 30 mg by mouth daily after breakfast.   TURMERIC PO Take 1 tablet by mouth daily.   Vitamin D3 50 MCG (2000 UT) capsule Take 2,000 Units by mouth every morning.            Durable Medical Equipment  (From admission, onward)         Start     Ordered   04/08/19 1423  DME Oxygen  Once    Comments: SATURATION QUALIFICATIONS: (This note is used to comply with regulatory documentation for home oxygen)  Patient Saturations on Room Air at Rest = 87%  Patient Saturations on Room Air while Ambulating = 89-90%  Patient Saturations on 3 Liters of oxygen while Ambulating = 96%  Please briefly explain why patient needs home oxygen: Patient hypoxic at rest and at night.  Improved with supplemental oxygen.  Will require supplemental oxygen at home at rest and with activity based on these findings.   Question Answer Comment  Length of Need 12 Months   Mode or (Route) Nasal cannula   Liters per Minute 3   Frequency Continuous (stationary and portable oxygen unit needed)   Oxygen conserving device Yes   Oxygen delivery system Gas      04/08/19 1425         No Known Allergies Follow-up Information    Harlan Stains, MD Follow up.   Specialty: Family Medicine Why: Call for a follow up appointment Contact information: [place] [address] [phone]        Candee Furbish, MD Follow up.   Specialty: Pulmonary Disease Why: You should receive a follow up appointment with pulm in a few months,  please call to schedule as needed.  Contact information: [place] [address] [phone]        Magrinat, [name], MD Follow up.   Specialty: Oncology Why: Please call to set up a follow up appointment with Dr. Jana Hakim to discuss your blood clots Contact information: [place] [address] [phone]  The results of significant diagnostics from this hospitalization (including imaging, microbiology, ancillary and laboratory) are listed below for reference.    Significant Diagnostic Studies: Ct Angio Chest Pe W And/or Wo Contrast  Result Date: 04/06/2019 CLINICAL DATA:  Increasing shortness of breath EXAM: CT ANGIOGRAPHY CHEST WITH CONTRAST TECHNIQUE: Multidetector CT imaging of the chest was performed using the standard protocol during bolus administration of intravenous contrast. Multiplanar CT image reconstructions and MIPs were obtained to evaluate the vascular anatomy. CONTRAST:  137mL OMNIPAQUE IOHEXOL 350 MG/ML SOLN COMPARISON:  For 02/2006 report. FINDINGS: Cardiovascular: Extensive bilateral pulmonary emboli are noted involving all lobes of both lungs, right greater than left. RV to LV ratio is 1.8, significantly elevated compatible with right heart strain. Heart is upper limits normal in size. Mediastinum/Nodes: No mediastinal, hilar, or axillary adenopathy. Lungs/Pleura: Trace right pleural effusion. Patchy ground-glass opacities in the posterior left upper lobe, anteromedial right middle lobe which could reflect early infarcts. Upper Abdomen: Imaging into the upper abdomen shows no acute findings. Musculoskeletal: Chest wall soft tissues are unremarkable. No acute bony abnormality. Review of the MIP images confirms the above findings. IMPRESSION: Positive for acute PE with CT evidence of right heart strain (RV/LV Ratio = 1.8) consistent with at least submassive (intermediate risk) PE. The presence of right heart strain has  been associated with an increased risk of morbidity and mortality. Please activate Code PE by paging [phone]. Critical Value/emergent results were called by telephone at the time of interpretation on 04/06/2019 at 11:37 pm to Dr. Marda Stalker , who verbally acknowledged these results. Electronically Signed   By: Rolm Baptise M.D.   On: 04/06/2019 23:37   Dg Chest Portable 1 View  Result Date: 04/06/2019 CLINICAL DATA:  Shortness of breath, chest pain, fatigue, cough EXAM: PORTABLE CHEST 1 VIEW COMPARISON:  None. FINDINGS: Heart and mediastinal contours are within normal limits. No focal opacities or effusions. No acute bony abnormality. IMPRESSION: No active disease. Electronically Signed   By: Rolm Baptise M.D.   On: 04/06/2019 22:14   Vas [place] Lower Extremity Venous (dvt)  Result Date: 04/08/2019  Lower Venous Study Indications: Pulmonary embolism.  Risk Factors: None identified. Comparison Study: No prior studies. Performing Technologist: Oliver Hum RVT  Examination Guidelines: A complete evaluation includes B-mode imaging, spectral Doppler, color Doppler, and power Doppler as needed of all accessible portions of each vessel. Bilateral testing is considered an integral part of a complete examination. Limited examinations for reoccurring indications may be performed as noted.  +---------+---------------+---------+-----------+----------+-------+ RIGHT    CompressibilityPhasicitySpontaneityPropertiesSummary +---------+---------------+---------+-----------+----------+-------+ CFV      Full           Yes      Yes                          +---------+---------------+---------+-----------+----------+-------+ SFJ      Full                                                 +---------+---------------+---------+-----------+----------+-------+ FV Prox  Full                                                 +---------+---------------+---------+-----------+----------+-------+ FV  Mid  Full                                                 +---------+---------------+---------+-----------+----------+-------+ FV DistalFull                                                 +---------+---------------+---------+-----------+----------+-------+ PFV      Full                                                 +---------+---------------+---------+-----------+----------+-------+ POP      Full           Yes      Yes                          +---------+---------------+---------+-----------+----------+-------+ PTV      Full                                                 +---------+---------------+---------+-----------+----------+-------+ PERO     Full                                                 +---------+---------------+---------+-----------+----------+-------+   +---------+---------------+---------+-----------+----------+-------+ LEFT     CompressibilityPhasicitySpontaneityPropertiesSummary +---------+---------------+---------+-----------+----------+-------+ CFV      Full           Yes      Yes                          +---------+---------------+---------+-----------+----------+-------+ SFJ      Full                                                 +---------+---------------+---------+-----------+----------+-------+ FV Prox  Full                                                 +---------+---------------+---------+-----------+----------+-------+ FV Mid   Full                                                 +---------+---------------+---------+-----------+----------+-------+ FV DistalFull                                                 +---------+---------------+---------+-----------+----------+-------+ PFV      Full                                                 +---------+---------------+---------+-----------+----------+-------+  POP      Partial        Yes      Yes                  Acute    +---------+---------------+---------+-----------+----------+-------+ PTV      Full                                                 +---------+---------------+---------+-----------+----------+-------+ PERO     Full                                                 +---------+---------------+---------+-----------+----------+-------+ Gastroc  Full                                                 +---------+---------------+---------+-----------+----------+-------+ SSV      Full                                                 +---------+---------------+---------+-----------+----------+-------+     Summary: Right: There is no evidence of deep vein thrombosis in the lower extremity. No cystic structure found in the popliteal fossa. Left: Findings consistent with acute deep vein thrombosis involving the left popliteal vein. No cystic structure found in the popliteal fossa.  *See table(s) above for measurements and observations.    Preliminary     Microbiology: Recent Results (from the past 240 hour(s))  SARS Coronavirus 2 (CEPHEID- Performed in [facility] [facility] hospital lab), Hosp Order     Status: None   Collection Time: 04/06/19 10:13 PM   Specimen: Nasopharyngeal Swab  Result Value Ref Range Status   SARS Coronavirus 2 NEGATIVE NEGATIVE Final    Comment: (NOTE) If result is NEGATIVE SARS-CoV-2 target nucleic acids are NOT DETECTED. The SARS-CoV-2 RNA is generally detectable in upper and lower  respiratory specimens during the acute phase of infection. The lowest  concentration of SARS-CoV-2 viral copies this assay can detect is 250  copies / mL. A negative result does not preclude SARS-CoV-2 infection  and should not be used as the sole basis for treatment or other  patient management decisions.  A negative result may occur with  improper specimen collection / handling, submission of specimen other  than nasopharyngeal swab, presence of viral mutation(s) within the  areas targeted  by this assay, and inadequate number of viral copies  (<250 copies / mL). A negative result must be combined with clinical  observations, patient history, and epidemiological information. If result is POSITIVE SARS-CoV-2 target nucleic acids are DETECTED. The SARS-CoV-2 RNA is generally detectable in upper and lower  respiratory specimens dur ing the acute phase of infection.  Positive  results are indicative of active infection with SARS-CoV-2.  Clinical  correlation with patient history and other diagnostic information is  necessary to determine patient infection status.  Positive results do  not rule out bacterial infection or co-infection with other viruses. If result is PRESUMPTIVE POSTIVE SARS-CoV-2 nucleic acids  MAY BE PRESENT.   A presumptive positive result was obtained on the submitted specimen  and confirmed on repeat testing.  While 2019 novel coronavirus  (SARS-CoV-2) nucleic acids may be present in the submitted sample  additional confirmatory testing may be necessary for epidemiological  and / or clinical management purposes  to differentiate between  SARS-CoV-2 and other Sarbecovirus currently known to infect humans.  If clinically indicated additional testing with an alternate test  methodology [phone]) is advised. The SARS-CoV-2 RNA is generally  detectable in upper and lower respiratory sp ecimens during the acute  phase of infection. The expected result is Negative. Fact Sheet for Patients:  StrictlyIdeas.no Fact Sheet for Healthcare Providers: BankingDealers.co.za This test is not yet approved or cleared by the [place] FDA and has been authorized for detection and/or diagnosis of SARS-CoV-2 by FDA under an Emergency Use Authorization (EUA).  This EUA will remain in effect (meaning this test can be used) for the duration of the COVID-19 declaration under Section 564(b)(1) of the Act, 21 U.S.C. section  360bbb-3(b)(1), unless the authorization is terminated or revoked sooner. Performed at [facility], [place] [address] [place], [place] [address]   MRSA PCR Screening     Status: None   Collection Time: 04/07/19  3:04 AM   Specimen: Nasal Mucosa; Nasopharyngeal  Result Value Ref Range Status   MRSA by PCR NEGATIVE NEGATIVE Final    Comment:        The GeneXpert MRSA Assay (FDA approved for NASAL specimens only), is one component of a comprehensive MRSA colonization surveillance program. It is not intended to diagnose MRSA infection nor to guide or monitor treatment for MRSA infections. Performed at [facility], [place] [address], [place], [place] [address]      Labs: Basic Metabolic Panel: Recent Labs  Lab 04/06/19 2142 04/07/19 0352 04/08/19 0229  NA 140 140 141  K 3.3* 3.5 3.8  CL 105 104 106  CO2 23 22 21*  GLUCOSE 101* 90 99  BUN 22 20 19   CREATININE 0.92 0.86 0.86  CALCIUM 9.7 9.3 9.1   Liver Function Tests: Recent Labs  Lab 04/06/19 2142 04/07/19 0352  AST 40 34  ALT 67* 61*  ALKPHOS 114 109  BILITOT 0.5 0.6  PROT 7.3 7.0  ALBUMIN 3.8 3.6   Recent Labs  Lab 04/06/19 2142  LIPASE 49   No results for input(s): AMMONIA in the last 168 hours. CBC: Recent Labs  Lab 04/06/19 2142 04/07/19 0352 04/08/19 0229  WBC 9.2 9.4 10.3  NEUTROABS 5.1  --   --   HGB 13.0 12.9 13.1  HCT 41.1 41.0 41.9  MCV 95.8 98.1 96.1  PLT 364 300 361   Cardiac Enzymes: Recent Labs  Lab 04/07/19 0352  CKTOTAL 90  CKMB 3.7   BNP: BNP (last 3 results) Recent Labs    04/06/19 2213 04/08/19 0229  BNP 862.8* 934.0*    ProBNP (last 3 results) No results for input(s): PROBNP in the last 8760 hours.  CBG: No results for input(s): GLUCAP in the last 168 hours.     Signed:  Fayrene Helper MD.  [facility] 04/08/2019, 3:43 PM

## 2019-04-08 NOTE — Progress Notes (Addendum)
NAME:  MOYA DUAN, MRN:  [id number], DOB:  Nov 23, 1945, LOS: 2 ADMISSION DATE:  04/06/2019, CONSULTATION DATE:  04/07/19 REFERRING MD:  Hospitalist, CHIEF COMPLAINT:  DOE   Brief History   73 year old woman presenting with submassive PE.  Past Medical History  HTN HLD Hypothyroidism Prediabetes Distant hx of breast cancer  Significant Hospital Events   7/13 Admitted  Consults:  7/14 PCCM  Procedures:  None  Significant Diagnostic Tests:  CTA PE, large pulmonary trunk, RV>LV, suggestion of early infarct on R  Micro Data:  NA  Antimicrobials:  None   Interim history/subjective:  Feels much better she is ready to go home  Objective   Blood pressure 120/76, pulse 70, temperature 98.5 F (36.9 C), temperature source Oral, resp. rate 18, height 5\' 1"  (1.549 m), weight 88.9 kg, SpO2 98 %.        Intake/Output Summary (Last 24 hours) at 04/08/2019 1112 Last data filed at 04/08/2019 0325 Gross per 24 hour  Intake 180.49 ml  Output 1375 ml  Net -1194.51 ml   Filed Weights   04/06/19 2056 04/07/19 0310  Weight: 88.9 kg 88.9 kg    Examination:  General 73 year old white female resting comfortably in bed currently on room air no acute distress HEENT normocephalic atraumatic no jugular venous distention Pulmonary: Clear to auscultation without accessory use Cardiac: Regular rate and rhythm without murmur rub or gallop Abdomen: Soft nontender no organomegaly Extremities: Warm and dry brisk cap refill Neuro: Awake oriented no focal deficits.  Resolved Hospital Problem list   NA  Assessment & Plan:   Submassive PE w/ evidence or RV strain and lower extremity DVT -only seemingly provoking risk factor seems sedentary life style, but does have remote h/o breast cancer & has apparently been keeping up w/ her mammograms.  Plan/rec Cont 3-6 months of DOAC. Might be best to have her see heme again at some point to determine possible recurrent cancer risk We will  set up follow-up  Possible sleep apnea Plan Needs PSG   Okay for discharge from our standpoint  Erick Colace ACNP-BC [facility] Pager # [phone] OR # [phone] if no answer   Attending Note:  I have examined patient, reviewed labs, studies and notes.   73 year old woman with a history of remote breast cancer, obesity, admitted with acute pulmonary embolism, lower extremity DVT and evidence for right heart strain.  She has overall clinically improved on heparin, now transitioned to DOAC.  Of note she has had periods of hypoxemia not necessarily associated with exertion or ambulation, more consistent with hypoventilation state exacerbated by the PE.   Vitals:   04/08/19 0900 04/08/19 0919 04/08/19 0930 04/08/19 1000  BP:  120/76    Pulse:      Resp:      Temp:      TempSrc:      SpO2: 96%  98% 98%  Weight:      Height:      Comfortable obese woman, lying in bed with oxygen on at 2 L/min.  Lungs are distant, clear.  Heart regular without a murmur.  Abdomen is obese, soft without any tenderness.  Trace lower extremity edema.  Agree with treatment with DOAC, probably 6 months given the fact that this was not clearly a provoked clot.  We will attempt to wean her oxygen as able but if she requires supplemental oxygen for home then this can be arranged, probably temporary.  Given her nocturnal desaturation overnight  which was more notable, suspect she may have sleep disordered breathing.  She needs a polysomnogram as an outpatient and we can help arrange this when she follows up with [place].   Baltazar Apo, MD, PhD 04/08/2019, 3:30 PM  [facility] [phone] or if no answer [phone]

## 2019-04-08 NOTE — Progress Notes (Addendum)
SATURATION QUALIFICATIONS: (This note is used to comply with regulatory documentation for home oxygen)  Patient Saturations on Room Air at Rest = 87%  Patient Saturations on Room Air while Ambulating =   Patient Saturations on 3 Liters of oxygen while Ambulating = 96%  Please briefly explain why patient needs home oxygen: Patient frequently desaturates while on room air.

## 2019-04-09 ENCOUNTER — Telehealth: Payer: Self-pay

## 2019-04-09 NOTE — Telephone Encounter (Signed)
No schedule available for Dr. Tamala Julian at this time. Recall placed for 3 month f/u. If still not schedule at this time we will have patient see another provider. Nothing further is needed at this time.

## 2019-04-09 NOTE — Telephone Encounter (Signed)
-----   Message from Candee Furbish, MD sent at 04/07/2019  8:17 AM EDT ----- Regarding: New patient followup 3 month f/u for PE With me or midlevel is fine  Thanks, Erskine Emery

## 2019-04-13 DIAGNOSIS — R0902 Hypoxemia: Secondary | ICD-10-CM | POA: Diagnosis not present

## 2019-04-13 DIAGNOSIS — I82432 Acute embolism and thrombosis of left popliteal vein: Secondary | ICD-10-CM | POA: Diagnosis not present

## 2019-04-13 DIAGNOSIS — I2699 Other pulmonary embolism without acute cor pulmonale: Secondary | ICD-10-CM | POA: Diagnosis not present

## 2019-04-13 DIAGNOSIS — Z09 Encounter for follow-up examination after completed treatment for conditions other than malignant neoplasm: Secondary | ICD-10-CM | POA: Diagnosis not present

## 2019-04-16 DIAGNOSIS — R0902 Hypoxemia: Secondary | ICD-10-CM | POA: Diagnosis not present

## 2019-04-16 DIAGNOSIS — I2699 Other pulmonary embolism without acute cor pulmonale: Secondary | ICD-10-CM | POA: Diagnosis not present

## 2019-04-16 DIAGNOSIS — Z09 Encounter for follow-up examination after completed treatment for conditions other than malignant neoplasm: Secondary | ICD-10-CM | POA: Diagnosis not present

## 2019-04-16 DIAGNOSIS — I82432 Acute embolism and thrombosis of left popliteal vein: Secondary | ICD-10-CM | POA: Diagnosis not present

## 2019-04-20 ENCOUNTER — Other Ambulatory Visit (HOSPITAL_COMMUNITY): Payer: Self-pay | Admitting: Family Medicine

## 2019-04-20 DIAGNOSIS — I2699 Other pulmonary embolism without acute cor pulmonale: Secondary | ICD-10-CM

## 2019-04-22 ENCOUNTER — Other Ambulatory Visit: Payer: Self-pay | Admitting: Oncology

## 2019-04-23 ENCOUNTER — Other Ambulatory Visit (HOSPITAL_COMMUNITY): Payer: Self-pay | Admitting: Family Medicine

## 2019-04-23 ENCOUNTER — Telehealth: Payer: Self-pay | Admitting: Oncology

## 2019-04-23 DIAGNOSIS — I2699 Other pulmonary embolism without acute cor pulmonale: Secondary | ICD-10-CM

## 2019-04-23 NOTE — Telephone Encounter (Signed)
Scheduled appt per 7/29 sch message - pt is aware of appt date and time   

## 2019-05-06 ENCOUNTER — Other Ambulatory Visit: Payer: Self-pay | Admitting: *Deleted

## 2019-05-07 ENCOUNTER — Other Ambulatory Visit: Payer: Self-pay | Admitting: Oncology

## 2019-05-07 ENCOUNTER — Inpatient Hospital Stay: Payer: Medicare HMO | Attending: Oncology

## 2019-05-07 ENCOUNTER — Other Ambulatory Visit: Payer: Self-pay

## 2019-05-07 DIAGNOSIS — I2699 Other pulmonary embolism without acute cor pulmonale: Secondary | ICD-10-CM | POA: Diagnosis not present

## 2019-05-07 DIAGNOSIS — I10 Essential (primary) hypertension: Secondary | ICD-10-CM | POA: Diagnosis not present

## 2019-05-07 DIAGNOSIS — E039 Hypothyroidism, unspecified: Secondary | ICD-10-CM

## 2019-05-07 DIAGNOSIS — Z9221 Personal history of antineoplastic chemotherapy: Secondary | ICD-10-CM | POA: Diagnosis not present

## 2019-05-07 DIAGNOSIS — Z7901 Long term (current) use of anticoagulants: Secondary | ICD-10-CM | POA: Diagnosis not present

## 2019-05-07 DIAGNOSIS — Z923 Personal history of irradiation: Secondary | ICD-10-CM | POA: Insufficient documentation

## 2019-05-07 DIAGNOSIS — I2602 Saddle embolus of pulmonary artery with acute cor pulmonale: Secondary | ICD-10-CM

## 2019-05-07 DIAGNOSIS — I289 Disease of pulmonary vessels, unspecified: Secondary | ICD-10-CM

## 2019-05-07 DIAGNOSIS — Z801 Family history of malignant neoplasm of trachea, bronchus and lung: Secondary | ICD-10-CM | POA: Diagnosis not present

## 2019-05-07 DIAGNOSIS — E669 Obesity, unspecified: Secondary | ICD-10-CM | POA: Insufficient documentation

## 2019-05-07 DIAGNOSIS — Z853 Personal history of malignant neoplasm of breast: Secondary | ICD-10-CM | POA: Insufficient documentation

## 2019-05-07 LAB — TSH: TSH: 1.08 u[IU]/mL (ref 0.308–3.960)

## 2019-05-07 LAB — COMPREHENSIVE METABOLIC PANEL
ALT: 22 U/L (ref 0–44)
AST: 24 U/L (ref 15–41)
Albumin: 3.8 g/dL (ref 3.5–5.0)
Alkaline Phosphatase: 77 U/L (ref 38–126)
Anion gap: 10 (ref 5–15)
BUN: 16 mg/dL (ref 8–23)
CO2: 24 mmol/L (ref 22–32)
Calcium: 10.1 mg/dL (ref 8.9–10.3)
Chloride: 105 mmol/L (ref 98–111)
Creatinine, Ser: 0.87 mg/dL (ref 0.44–1.00)
GFR calc Af Amer: >60 mL/min (ref >60)
GFR calc non Af Amer: >60 mL/min (ref >60)
Glucose, Bld: 95 mg/dL (ref 70–99)
Potassium: 3.9 mmol/L (ref 3.5–5.1)
Sodium: 139 mmol/L (ref 135–145)
Total Bilirubin: 0.5 mg/dL (ref 0.3–1.2)
Total Protein: 6.7 g/dL (ref 6.5–8.1)

## 2019-05-07 LAB — CBC WITH DIFFERENTIAL/PLATELET
Abs Immature Granulocytes: 0.01 10*3/uL (ref 0.00–0.07)
Basophils Absolute: 0.1 10*3/uL (ref 0.0–0.1)
Basophils Relative: 1 %
Eosinophils Absolute: 0.2 10*3/uL (ref 0.0–0.5)
Eosinophils Relative: 3 %
HCT: 41.8 % (ref 36.0–46.0)
Hemoglobin: 13.3 g/dL (ref 12.0–15.0)
Immature Granulocytes: 0 %
Lymphocytes Relative: 45 %
Lymphs Abs: 3.9 10*3/uL (ref 0.7–4.0)
MCH: 30.4 pg (ref 26.0–34.0)
MCHC: 31.8 g/dL (ref 30.0–36.0)
MCV: 95.7 fL (ref 80.0–100.0)
Monocytes Absolute: 0.7 10*3/uL (ref 0.1–1.0)
Monocytes Relative: 8 %
Neutro Abs: 3.6 10*3/uL (ref 1.7–7.7)
Neutrophils Relative %: 43 %
Platelets: 311 10*3/uL (ref 150–400)
RBC: 4.37 MIL/uL (ref 3.87–5.11)
RDW: 14.5 % (ref 11.5–15.5)
WBC: 8.5 10*3/uL (ref 4.0–10.5)
nRBC: 0 % (ref 0.0–0.2)

## 2019-05-07 LAB — CEA (IN HOUSE-CHCC): CEA (CHCC-In House): 1.41 ng/mL (ref 0.00–5.00)

## 2019-05-08 LAB — CANCER ANTIGEN 27.29: CA 27.29: 14.6 U/mL (ref 0.0–38.6)

## 2019-05-08 LAB — CA 125: Cancer Antigen (CA) 125: 8.8 U/mL (ref 0.0–38.1)

## 2019-05-08 LAB — VITAMIN D 25 HYDROXY (VIT D DEFICIENCY, FRACTURES): Vit D, 25-Hydroxy: 52.5 ng/mL (ref 30.0–100.0)

## 2019-05-13 NOTE — Progress Notes (Signed)
[place]  [phone] [phone]    ID: Janne Lab DOB: 1946-03-31  MR#: [id number]  TGG#:[id number]  Patient Care Team: Harlan Stains, MD as PCP - General (Family Medicine) Carol Ada, MD as Consulting Physician (Cardiac Rehabilitation) Magrinat, [name], MD as Consulting Physician (Hematology and Oncology) Tyler Pita, MD as Consulting Physician (Radiation Oncology) Gaynelle Arabian, MD as Consulting Physician (Orthopedic Surgery) Collene Gobble, MD as Consulting Physician (Pulmonary Disease) OTHER MD:   CHIEF COMPLAINT: Coagulopathy  CURRENT TREATMENT: apixaban   HISTORY OF CURRENT ILLNESS: Cassandra [name] is a 73 y.o. woman I followed remotely for history of breast cancer, detailed below.  She is now referred referred here by Dr. Carol Ada for follow up to a Pulmonary Embolism and use of anticoagulants.   Miryah presented to the ED on 04/06/2019 with a two-week history of shortness of breath and dyspnea on exertion.  There have been no provoking incidents other than binging on an [place] TV mystery series, 3 to 4 hours at a time.  The first day she noted shortness of breath she was on her riding mower, March 25, 2019.  As her shortness of breath did not improve she thought she might have the coronavirus and got herself tested.  It took several days for the results to come back but they were negative.  She thensaw Dr. Tamala Julian for additional work-up and was referred to the emergency room.  There blood pressure was in the normal range although perhaps slightly lower than the patient's average. She underwent a chest xray on 04/06/2019 showing: Heart and mediastinal contours are within normal limits. No focal opacities or effusions. No acute bony abnormality.  She then underwent a CT chest angiography with contrast on the same day showing: Extensive bilateral pulmonary emboli  involving all lobes of both lungs, right greater than left. RV to LV  ratio is 1.8, significantly elevated compatible with right heart strain. Heart is upper limits normal in size. Trace right pleural effusion. Patchy ground-glass opacities in the posterior left upper lobe, anteromedial right middle lobe which could reflect early infarcts.  She was started on intravenous heparin and transitioned to apixaban 04/08/2019  Echocardiogram 04/07/2019 showed an ejection fraction of 60-65%, with moderately reduced right ventricular systolic function.  Bilateral lower extremity Dopplers 04/08/2019 found acute deep vein thrombus in the left popliteal vein.  The patient's subsequent history is as detailed below.   HISTORY OF BREAST CANCER: [name] has a remote history of breast cancer, status post lumpectomy under [facility] on 01/20/2004. The pathology from this procedure showed ([id number]):  1. Sentinel lymph node biopsy, right axillary node:    - one lymph node, negative for metastatic carcinoma (0 of 1), see comment.   2. Sentinel lymph node biopsy, right axillary node:    - one lymph node, negative for metastatic carcinoma (0 of 1), see comment.   3. Sentinel lymph node biopsy, right axillary node:    - one lymph node, negative for metastatic carcinoma (0 of 1), see comment.   4. Sentinel lymph node biopsy, right axillary node:    - one lymph node, negative for metastatic carcinoma (0 of 1), see comment.   5. Lumpectomy, right breast:    - invasive mammary carcinoma, no special type (ductal), 3.9 cm, msbr grade 3 of 3.    - high grade ductal carcinoma in situ.    - extensive intraductal component negative (jcrt).    - margins negative for tumor (in situ and  invasive).    - no definitive lymphovascular invasion identified.   The tumor was estrogen and progesterone receptor positive, HER-2 nonamplified, with an MIB-1 of 40%.  Adjuvant chemotherapy included cyclophosphamide and doxorubicin in dose dense fashion x4 followed by weekly paclitaxel x12.  This was followed  by adjuvant radiation, which was curtailed a few days because of significant skin issues.  She then received 5 years of anastrozole.  INTERVAL HISTORY: [name] was evaluated in the hematology clinic on 05/14/2019 .  REVIEW OF SYSTEMS: [name] is currently not exercising because of knee issues.  She has been evaluated in Dr.Aluisio's group and thinks she may need surgery at some point although she would like to postpone this as long as possible.  She enjoys swimming but of course the gyms are now closed.  Currently she denies any cough phlegm production pleurisy or hemoptysis.  She is clearly deconditioned.  She also has a pulse oximeter and has been getting readings around 88% when she lies down.  She has oxygen at home. She has no chest pain or pressure and her heart rate she says has been between mid 23s and mid 90s.  A detailed review of systems was otherwise noncontributory  PAST MEDICAL HISTORY: Past Medical History:  Diagnosis Date  . Cancer [facility])    Breast cancer right '06- surgery, chemo, radiation- released after 5 yrs  . Hx of seasonal allergies    OTC med used  . Hypertension   . Hypothyroidism      PAST SURGICAL HISTORY: Past Surgical History:  Procedure Laterality Date  . BREAST SURGERY Right    modified partial mastectomy/ lymph nodes dissection  . COLON SURGERY  2/17  . INCISIONAL HERNIA REPAIR N/A 04/06/2016   Procedure: LAPAROSCOPIC ASSISTED INCISIONAL HERNIA REPAIR WITH MESH;  Surgeon: Leighton Ruff, MD;  Location: [facility];  Service: General;  Laterality: N/A;  . TONSILLECTOMY       FAMILY HISTORY: Family History  Problem Relation Age of Onset  . COPD Father    Geralene's father died from lung cancer at age 7 her mother died from lung cancer at age 55.  Both were smokers.  She had 2 brothers no sisters.  One brother died from myocardial infarction.  The other one has had CVA.  Both also were smokers.  There is no history of breast or ovarian cancer in the family.    GYNECOLOGIC HISTORY:  No LMP recorded. Patient is postmenopausal. Menarche: 73 years old [place] P0 LMP: Mid 12s No hysterectomy or salpingo-oophorectomy   SOCIAL HISTORY: (Current as of 05/14/2019) Nataliah worked as a Pharmacist, [employer].  "I planned my life as a long vacation."  She lives alone with 2 cats in approximately 20 acres.  She is not a church attender   ADVANCED DIRECTIVES: Her neighbor Maple Mirza is her healthcare power of attorney.  Ms. Rosana Hoes can be reached at [phone]   HEALTH MAINTENANCE: Social History   Tobacco Use  . Smoking status: Never Smoker  . Smokeless tobacco: Never Used  Substance Use Topics  . Alcohol use: Yes    Comment: rare  . Drug use: No    Colonoscopy: 2015  PAP: "No more"  Bone density:  Mammography: At [facility]  No Known Allergies  Current Outpatient Medications  Medication Sig Dispense Refill  . albuterol (VENTOLIN HFA) 108 (90 Base) MCG/ACT inhaler Inhale 2 puffs into the lungs every 4 (four) hours as needed for wheezing or shortness of breath.     Marland Kitchen  atorvastatin (LIPITOR) 80 MG tablet Take 80 mg by mouth every evening.    . benazepril (LOTENSIN) 40 MG tablet Take 40 mg by mouth at bedtime.     . bisoprolol-hydrochlorothiazide (ZIAC) 10-6.25 MG tablet Take 1 tablet by mouth every morning.    Marland Kitchen CALCIUM PO Take 1 tablet by mouth 2 (two) times daily.    . Cholecalciferol (VITAMIN D3) 2000 units capsule Take 2,000 Units by mouth every morning.    . Coenzyme Q10 (CO Q 10 PO) Take 1 tablet by mouth every morning.    . Eliquis DVT/PE Starter Pack (ELIQUIS STARTER PACK) 5 MG TABS Take as directed on package: start with two-29m tablets twice daily for 7 days. On day 8, switch to one-578mtablet twice daily. 1 each 0  . levothyroxine (SYNTHROID, LEVOTHROID) 100 MCG tablet Take 100 mcg by mouth daily before breakfast.    . loratadine (CLARITIN) 10 MG tablet Take 10 mg by mouth every morning.    . Marland Kitchenysine 500 MG CAPS Take 500 mg by mouth  every morning.    . Multiple Vitamin (MULTIVITAMIN WITH MINERALS) TABS tablet Take 1 tablet by mouth every morning.    . Marland KitchenIFEdipine (ADALAT CC) 30 MG 24 hr tablet Take 30 mg by mouth daily after breakfast.    . Omega-3 Fatty Acids (FISH OIL) 1000 MG CAPS Take 1,000 mg by mouth every morning.    . TURMERIC PO Take 1 tablet by mouth daily.     No current facility-administered medications for this visit.      OBJECTIVE: Obese white woman who appears stated age  Vi54  05/14/19 1618  BP: 116/72  Pulse: 76  Resp: 18  Temp: 98.5 F (36.9 C)  SpO2: 94%     Body mass index is 36.37 kg/m.   Wt Readings from Last 3 Encounters:  05/14/19 192 lb 8 oz (87.3 kg)  04/07/19 195 lb 15.8 oz (88.9 kg)  04/06/16 194 lb (88 kg)      ECOG FS:1 - Symptomatic but completely ambulatory  Ocular: Sclerae unicteric, pupils round and equal Wearing a mask Lymphatic: No cervical or supraclavicular adenopathy Lungs no rales or rhonchi Heart regular rate and rhythm Abd soft, nontender, positive bowel sounds MSK no focal spinal tenderness, no joint edema Neuro: non-focal, well-oriented, appropriate affect Breasts: The right breast is slightly smaller than the left.  There is no suspicious skin or nipple change of concern.  There is no evidence of local recurrence.  The left breast is benign.  Both axillae are benign.   LAB RESULTS:  CMP     Component Value Date/Time   NA 139 05/07/2019 1133   K 3.9 05/07/2019 1133   CL 105 05/07/2019 1133   CO2 24 05/07/2019 1133   GLUCOSE 95 05/07/2019 1133   BUN 16 05/07/2019 1133   CREATININE 0.87 05/07/2019 1133   CALCIUM 10.1 05/07/2019 1133   PROT 6.7 05/07/2019 1133   ALBUMIN 3.8 05/07/2019 1133   AST 24 05/07/2019 1133   ALT 22 05/07/2019 1133   ALKPHOS 77 05/07/2019 1133   BILITOT 0.5 05/07/2019 1133   GFRNONAA >60 05/07/2019 1133   GFRAA >60 05/07/2019 1133    No results found for: TOTALPROTELP, ALBUMINELP, A1GS, A2GS, BETS, BETA2SER, GAMS,  MSPIKE, SPEI  No results found for: KPAFRELGTCHN, LAMBDASER, KAPLAMBRATIO  Lab Results  Component Value Date   WBC 8.5 05/07/2019   NEUTROABS 3.6 05/07/2019   HGB 13.3 05/07/2019   HCT 41.8 05/07/2019  MCV 95.7 05/07/2019   PLT 311 05/07/2019    '@LASTCHEMISTRY' @  Lab Results  Component Value Date   LABCA2 17 01/09/2010    No components found for: [id number]  No results for input(s): INR in the last 168 hours.  Lab Results  Component Value Date   LABCA2 17 01/09/2010    No results found for: [id number]  Lab Results  Component Value Date   CAN125 8.8 05/07/2019    No results found for: [id number]  Lab Results  Component Value Date   CA2729 14.6 05/07/2019    No components found for: HGQUANT  Lab Results  Component Value Date   CEA1 1.41 05/07/2019   /  CEA ([facility])  Date Value Ref Range Status  05/07/2019 1.41 0.00 - 5.00 ng/mL Final    Comment:    (NOTE) This test was performed using Architect's Chemiluminescent Microparticle Immunoassay. Values obtained from different assay methods cannot be used interchangeably. Please note that 5-10% of patients who smoke may see CEA levels up to 6.9 ng/mL. Performed at [facility], [place] [address], [place], [place] [address]      No results found for: AFPTUMOR  No results found for: [place]  No results found for: PSA1  No visits with results within 3 Day(s) from this visit.  Latest known visit with results is:  Appointment on 05/07/2019  Component Date Value Ref Range Status  . TSH 05/07/2019 1.080  0.308 - 3.960 uIU/mL Final   Performed at [facility], [place] [address] [place], [place] [address]  . Vit D, 25-Hydroxy 05/07/2019 52.5  30.0 - 100.0 ng/mL Final   Comment: (NOTE) Vitamin D deficiency has been defined by the [place] practice guideline as a level of serum 25-OH vitamin D less than 20 ng/mL (1,2). The  Endocrine Society went on to further define vitamin D insufficiency as a level between 21 and 29 ng/mL (2). 1. IOM (Institute of Medicine). 2010. Dietary reference   intakes for calcium and D. [place]: The   [employer]. 2. Holick MF, Binkley [place], Bischoff-Ferrari HA, et al.   Evaluation, treatment, and prevention of vitamin D   deficiency: an Endocrine Society clinical practice   guideline. JCEM. 2011 Jul; 96(7):1911-30. Performed At: [facility] [place], [place] [id number] Rush Farmer MD [id number]   . Cancer Antigen (CA) 125 05/07/2019 8.8  0.0 - 38.1 U/mL Final   Comment: (NOTE) Roche Diagnostics Electrochemiluminescence Immunoassay (ECLIA) Values obtained with different assay methods or kits cannot be used interchangeably.  Results cannot be interpreted as absolute evidence of the presence or absence of malignant disease. Performed At: [facility] [place], [place] [id number] Rush Farmer MD [id number]   . CA 27.29 05/07/2019 14.6  0.0 - 38.6 U/mL Final   Comment: (NOTE) Siemens Centaur Immunochemiluminometric Methodology [facility]) Values obtained with different assay methods or kits cannot be used interchangeably. Results cannot be interpreted as absolute evidence of the presence or absence of malignant disease. Performed At: [facility] [place], [place] [id number] Rush Farmer MD [id number]   . CEA ([facility]) 05/07/2019 1.41  0.00 - 5.00 ng/mL Final   Comment: (NOTE) This test was performed using Architect's Chemiluminescent Microparticle Immunoassay. Values obtained from different assay methods cannot be used interchangeably. Please note that 5-10% of patients who smoke may see CEA levels up to 6.9 ng/mL. Performed at [facility], [place] [address] [place], [place] [address]   . Sodium 05/07/2019 139  135 - 145 mmol/L Final  . Potassium  05/07/2019 3.9  3.5 - 5.1 mmol/L Final  . Chloride 05/07/2019 105  98 - 111 mmol/L Final  . CO2 05/07/2019 24  22 - 32 mmol/L Final  . Glucose, Bld 05/07/2019 95  70 - 99 mg/dL Final  . BUN 05/07/2019 16  8 - 23 mg/dL Final  . Creatinine, Ser 05/07/2019 0.87  0.44 - 1.00 mg/dL Final  . Calcium 05/07/2019 10.1  8.9 - 10.3 mg/dL Final  . Total Protein 05/07/2019 6.7  6.5 - 8.1 g/dL Final  . Albumin 05/07/2019 3.8  3.5 - 5.0 g/dL Final  . AST 05/07/2019 24  15 - 41 U/L Final  . ALT 05/07/2019 22  0 - 44 U/L Final  . Alkaline Phosphatase 05/07/2019 77  38 - 126 U/L Final  . Total Bilirubin 05/07/2019 0.5  0.3 - 1.2 mg/dL Final  . GFR calc non Af Amer 05/07/2019 >60  >60 mL/min Final  . GFR calc Af Amer 05/07/2019 >60  >60 mL/min Final  . Anion gap 05/07/2019 10  5 - 15 Final   Performed at [facility], [place] [address] [place], [place] [address]  . WBC 05/07/2019 8.5  4.0 - 10.5 K/uL Final  . RBC 05/07/2019 4.37  3.87 - 5.11 MIL/uL Final  . Hemoglobin 05/07/2019 13.3  12.0 - 15.0 g/dL Final  . HCT 05/07/2019 41.8  36.0 - 46.0 % Final  . MCV 05/07/2019 95.7  80.0 - 100.0 fL Final  . MCH 05/07/2019 30.4  26.0 - 34.0 pg Final  . MCHC 05/07/2019 31.8  30.0 - 36.0 g/dL Final  . RDW 05/07/2019 14.5  11.5 - 15.5 % Final  . Platelets 05/07/2019 311  150 - 400 K/uL Final  . nRBC 05/07/2019 0.0  0.0 - 0.2 % Final  . Neutrophils Relative % 05/07/2019 43  % Final  . Neutro Abs 05/07/2019 3.6  1.7 - 7.7 K/uL Final  . Lymphocytes Relative 05/07/2019 45  % Final  . Lymphs Abs 05/07/2019 3.9  0.7 - 4.0 K/uL Final  . Monocytes Relative 05/07/2019 8  % Final  . Monocytes Absolute 05/07/2019 0.7  0.1 - 1.0 K/uL Final  . Eosinophils Relative 05/07/2019 3  % Final  . Eosinophils Absolute 05/07/2019 0.2  0.0 - 0.5 K/uL Final  . Basophils Relative 05/07/2019 1  % Final  . Basophils Absolute 05/07/2019 0.1  0.0 - 0.1 K/uL Final  . Immature Granulocytes 05/07/2019 0  % Final  . Abs  Immature Granulocytes 05/07/2019 0.01  0.00 - 0.07 K/uL Final   Performed at [facility], [place] [address], [place], [place] [address]    (this displays the last labs from the last 3 days)  No results found for: TOTALPROTELP, ALBUMINELP, A1GS, A2GS, BETS, BETA2SER, GAMS, MSPIKE, SPEI (this displays SPEP labs)  No results found for: KPAFRELGTCHN, LAMBDASER, KAPLAMBRATIO (kappa/lambda light chains)  No results found for: HGBA, HGBA2QUANT, HGBFQUANT, HGBSQUAN (Hemoglobinopathy evaluation)   No results found for: LDH  No results found for: IRON, TIBC, IRONPCTSAT (Iron and TIBC)  No results found for: FERRITIN  Urinalysis    Component Value Date/Time   COLORURINE YELLOW 04/07/2019 0400   APPEARANCEUR CLEAR 04/07/2019 0400   LABSPEC >1.046 (H) 04/07/2019 0400   PHURINE 5.0 04/07/2019 0400   GLUCOSEU NEGATIVE 04/07/2019 0400   HGBUR NEGATIVE 04/07/2019 0400  BILIRUBINUR NEGATIVE 04/07/2019 0400   KETONESUR NEGATIVE 04/07/2019 0400   PROTEINUR NEGATIVE 04/07/2019 0400   NITRITE NEGATIVE 04/07/2019 0400   LEUKOCYTESUR NEGATIVE 04/07/2019 0400     STUDIES:  No results found.   ELIGIBLE FOR AVAILABLE RESEARCH PROTOCOL: no   ASSESSMENT: 73 y.o. [place], [place] woman   (1) status post right lumpectomy and sentinel lymph node sampling 01/20/2004 for a pT2 pN0, stage IIA invasive ductal carcinoma, grade 3, estrogen and progesterone receptor positive, HER-2 not amplified  (a) a total of 4 right axillary lymph nodes were removed  (b) status post chemotherapy  (c) status post tamoxifen  (d) status post adjuvant radiation  (2) bilateral submassive pulmonary embolus 04/06/2019 with right lower extremity DVT documented 04/08/2019,   (a) no obvious provoking incident  (b) screening tumor markers (CEA, CA-125, CA 27-29) negative  (c) risk factors include obesity and relatively sedentary lifestyle  (3) treated initially with intravenous heparin starting  04/06/2019  (a) transitioned to apixaban 5 mg twice daily on 04/08/2019  (b) lifelong anticoagulation recommended   PLAN: I spent approximately 60 minutes face to face with Gregary Signs with more than 50% of that time spent in counseling and coordination of care. Specifically we reviewed the biology of the patient's diagnosis and the specifics of her situation.    She understands it makes a big difference whether a blood clot was provoked and unprovoked.  A clearly provoked blood clot would be something that happens after surgery for example or during a long period of immobilization, or in the setting of cancer or inflammation.  None of those seem to have been the case here and she cannot think of anything that was happening in late June to bring this on.  The risk of repeat blood clot in cases of unprovoked PE/DVT exceeds 20% over the next 3 years after she stops anticoagulation.  Given her obesity, sedentary lifestyle, and the fact that this was a large bilateral pulmonary embolus, I am recommending lifelong anticoagulation for her and she is agreeable.  After 6 months we will drop the apixaban dose to 50%, which continues to be effective in terms of anticoagulation and reduces the risk of bleeding complications  She is already scheduled for repeat echo to follow her heart strain issue and to assess pulmonary artery pressure.  She also will be scheduled for a sleep study by her primary care physician.  I think it would be a good idea after those studies for her to see Dr. West Carbo the pulmonologist who saw her in the hospital to see his take on her situation  She will return to see me in late October or early November  Kimani knows to call for any other issue that may develop before the next visit.   Magrinat, [name], MD  05/14/19 5:38 PM Medical Oncology and Hematology [facility] [address] [place], [place] [address] Tel. [phone]    Fax. [phone]   I, Jacqualyn Posey am  acting as a Education administrator for Chauncey Cruel, MD.   I, Lurline Del MD, have reviewed the above documentation for accuracy and completeness, and I agree with the above.

## 2019-05-14 ENCOUNTER — Inpatient Hospital Stay (HOSPITAL_BASED_OUTPATIENT_CLINIC_OR_DEPARTMENT_OTHER): Payer: Medicare HMO | Admitting: Oncology

## 2019-05-14 ENCOUNTER — Other Ambulatory Visit: Payer: Self-pay

## 2019-05-14 VITALS — BP 116/72 | HR 76 | Temp 98.5°F | Resp 18 | Ht 61.0 in | Wt 192.5 lb

## 2019-05-14 DIAGNOSIS — Z9221 Personal history of antineoplastic chemotherapy: Secondary | ICD-10-CM

## 2019-05-14 DIAGNOSIS — Z801 Family history of malignant neoplasm of trachea, bronchus and lung: Secondary | ICD-10-CM | POA: Diagnosis not present

## 2019-05-14 DIAGNOSIS — I749 Embolism and thrombosis of unspecified artery: Secondary | ICD-10-CM

## 2019-05-14 DIAGNOSIS — I289 Disease of pulmonary vessels, unspecified: Secondary | ICD-10-CM

## 2019-05-14 DIAGNOSIS — I2699 Other pulmonary embolism without acute cor pulmonale: Secondary | ICD-10-CM | POA: Diagnosis not present

## 2019-05-14 DIAGNOSIS — I10 Essential (primary) hypertension: Secondary | ICD-10-CM | POA: Diagnosis not present

## 2019-05-14 DIAGNOSIS — Z853 Personal history of malignant neoplasm of breast: Secondary | ICD-10-CM

## 2019-05-14 DIAGNOSIS — Z923 Personal history of irradiation: Secondary | ICD-10-CM

## 2019-05-14 DIAGNOSIS — Z7901 Long term (current) use of anticoagulants: Secondary | ICD-10-CM | POA: Diagnosis not present

## 2019-05-14 DIAGNOSIS — D689 Coagulation defect, unspecified: Secondary | ICD-10-CM

## 2019-05-14 DIAGNOSIS — E669 Obesity, unspecified: Secondary | ICD-10-CM

## 2019-05-14 DIAGNOSIS — C50811 Malignant neoplasm of overlapping sites of right female breast: Secondary | ICD-10-CM

## 2019-05-14 DIAGNOSIS — E039 Hypothyroidism, unspecified: Secondary | ICD-10-CM | POA: Diagnosis not present

## 2019-05-14 DIAGNOSIS — Z170 Estrogen receptor positive status [ER+]: Secondary | ICD-10-CM | POA: Insufficient documentation

## 2019-05-14 DIAGNOSIS — I2690 Septic pulmonary embolism without acute cor pulmonale: Secondary | ICD-10-CM

## 2019-05-15 ENCOUNTER — Telehealth: Payer: Self-pay | Admitting: Adult Health

## 2019-05-15 NOTE — Telephone Encounter (Signed)
I talk with patient regarding schedule  

## 2019-05-20 DIAGNOSIS — I10 Essential (primary) hypertension: Secondary | ICD-10-CM | POA: Diagnosis not present

## 2019-05-20 DIAGNOSIS — G4734 Idiopathic sleep related nonobstructive alveolar hypoventilation: Secondary | ICD-10-CM | POA: Diagnosis not present

## 2019-05-20 DIAGNOSIS — G4719 Other hypersomnia: Secondary | ICD-10-CM | POA: Diagnosis not present

## 2019-05-20 DIAGNOSIS — G4700 Insomnia, unspecified: Secondary | ICD-10-CM | POA: Diagnosis not present

## 2019-05-20 DIAGNOSIS — R0683 Snoring: Secondary | ICD-10-CM | POA: Diagnosis not present

## 2019-05-28 ENCOUNTER — Encounter: Payer: Self-pay | Admitting: Adult Health

## 2019-05-28 DIAGNOSIS — Z1231 Encounter for screening mammogram for malignant neoplasm of breast: Secondary | ICD-10-CM | POA: Diagnosis not present

## 2019-05-28 DIAGNOSIS — Z853 Personal history of malignant neoplasm of breast: Secondary | ICD-10-CM | POA: Diagnosis not present

## 2019-06-14 DIAGNOSIS — I2699 Other pulmonary embolism without acute cor pulmonale: Secondary | ICD-10-CM | POA: Diagnosis not present

## 2019-06-15 DIAGNOSIS — G4733 Obstructive sleep apnea (adult) (pediatric): Secondary | ICD-10-CM | POA: Diagnosis not present

## 2019-06-15 DIAGNOSIS — G4721 Circadian rhythm sleep disorder, delayed sleep phase type: Secondary | ICD-10-CM | POA: Diagnosis not present

## 2019-06-17 ENCOUNTER — Other Ambulatory Visit (HOSPITAL_COMMUNITY): Payer: Medicare HMO

## 2019-06-18 ENCOUNTER — Other Ambulatory Visit: Payer: Self-pay

## 2019-06-18 ENCOUNTER — Ambulatory Visit (HOSPITAL_COMMUNITY): Payer: Medicare HMO | Attending: Internal Medicine

## 2019-06-18 DIAGNOSIS — I340 Nonrheumatic mitral (valve) insufficiency: Secondary | ICD-10-CM | POA: Diagnosis not present

## 2019-06-18 DIAGNOSIS — I2699 Other pulmonary embolism without acute cor pulmonale: Secondary | ICD-10-CM | POA: Diagnosis not present

## 2019-06-19 ENCOUNTER — Encounter: Payer: Self-pay | Admitting: Emergency Medicine

## 2019-06-19 ENCOUNTER — Ambulatory Visit: Payer: Medicare HMO | Admitting: Emergency Medicine

## 2019-06-19 DIAGNOSIS — I289 Disease of pulmonary vessels, unspecified: Secondary | ICD-10-CM | POA: Diagnosis not present

## 2019-06-19 DIAGNOSIS — G4733 Obstructive sleep apnea (adult) (pediatric): Secondary | ICD-10-CM

## 2019-06-19 DIAGNOSIS — I2690 Septic pulmonary embolism without acute cor pulmonale: Secondary | ICD-10-CM

## 2019-06-19 DIAGNOSIS — I2782 Chronic pulmonary embolism: Secondary | ICD-10-CM

## 2019-06-19 NOTE — Progress Notes (Signed)
Subjective:    Patient ID: Cassandra [name], female    DOB: January 12, 1946, 73 y.o.   MRN: [id number]  HPI 73 year old never smoker with a history of breast cancer (2006), hypertension, hypothyroidism, seasonal allergies.  She was admitted with acute pulmonary embolism 04/06/2019.  Her CT chest showed evidence for right heart strain which was confirmed on echocardiogram done 04/07/2019 -showed a moderately dilated RV with moderate systolic dysfunction and severe PAH, RVSP 78 mmHg.  She presents today for follow-up.  She is been treated with Eliquis, remains on this.  Her clot was apparently unprovoked and plan at least for now is to stay on anticoagulation for life. She had a PSG at [facility] last week that apparently showed an AHI of 19/h. She is planning to start CPAP.   She feels better - much less short of breath, less fatigue. She does note that her activity is limited due to knee OA. No complications from eliquis  A repeat echocardiogram done 06/18/2019 reviewed by me.  This shows normal right atrium, normal RV size, wall thickness and systolic function.  There is now trivial TR and no evidence for PAH.   Review of Systems  Constitutional: Negative for fever and unexpected weight change.  HENT: Negative for congestion, dental problem, ear pain, nosebleeds, postnasal drip, rhinorrhea, sinus pressure, sneezing, sore throat and trouble swallowing.   Eyes: Negative for redness and itching.  Respiratory: Negative for cough, chest tightness, shortness of breath and wheezing.   Cardiovascular: Negative for palpitations and leg swelling.  Gastrointestinal: Negative for nausea and vomiting.  Genitourinary: Negative for dysuria.  Musculoskeletal: Negative for joint swelling.  Skin: Negative for rash.  Neurological: Negative for headaches.  Hematological: Does not bruise/bleed easily.  Psychiatric/Behavioral: Negative for dysphoric mood. The patient is not nervous/anxious.      Past Medical History:   Diagnosis Date  . Cancer [facility])    Breast cancer right '06- surgery, chemo, radiation- released after 5 yrs  . Hx of seasonal allergies    OTC med used  . Hypertension   . Hypothyroidism      Family History  Problem Relation Age of Onset  . COPD Father      Social History   Socioeconomic History  . Marital status: Single    Spouse name: Not on file  . Number of children: Not on file  . Years of education: Not on file  . Highest education level: Not on file  Occupational History  . Not on file  Social Needs  . Financial resource strain: Not on file  . Food insecurity    Worry: Not on file    Inability: Not on file  . Transportation needs    Medical: Not on file    Non-medical: Not on file  Tobacco Use  . Smoking status: Never Smoker  . Smokeless tobacco: Never Used  Substance and Sexual Activity  . Alcohol use: Yes    Comment: rare  . Drug use: No  . Sexual activity: Not on file  Lifestyle  . Physical activity    Days per week: Not on file    Minutes per session: Not on file  . Stress: Not on file  Relationships  . Social Herbalist on phone: Not on file    Gets together: Not on file    Attends religious service: Not on file    Active member of club or organization: Not on file    Attends meetings of clubs or  organizations: Not on file    Relationship status: Not on file  . Intimate partner violence    Fear of current or ex partner: Not on file    Emotionally abused: Not on file    Physically abused: Not on file    Forced sexual activity: Not on file  Other Topics Concern  . Not on file  Social History Narrative  . Not on file  Has worked in education.  No inhaled exposures.   No Known Allergies   Outpatient Medications Prior to Visit  Medication Sig Dispense Refill  . albuterol (VENTOLIN HFA) 108 (90 Base) MCG/ACT inhaler Inhale 2 puffs into the lungs every 4 (four) hours as needed for wheezing or shortness of breath.     Marland Kitchen apixaban  (ELIQUIS) 5 MG TABS tablet Take 5 mg by mouth 2 (two) times daily.    Marland Kitchen atorvastatin (LIPITOR) 80 MG tablet Take 80 mg by mouth every evening.    . benazepril (LOTENSIN) 40 MG tablet Take 40 mg by mouth at bedtime.     . bisoprolol-hydrochlorothiazide (ZIAC) 10-6.25 MG tablet Take 1 tablet by mouth every morning.    Marland Kitchen CALCIUM PO Take 1 tablet by mouth 2 (two) times daily.    . Cholecalciferol (VITAMIN D3) 2000 units capsule Take 2,000 Units by mouth every morning.    . Coenzyme Q10 (CO Q 10 PO) Take 1 tablet by mouth every morning.    Marland Kitchen levothyroxine (SYNTHROID, LEVOTHROID) 100 MCG tablet Take 100 mcg by mouth daily before breakfast.    . loratadine (CLARITIN) 10 MG tablet Take 10 mg by mouth every morning.    Marland Kitchen Lysine 500 MG CAPS Take 500 mg by mouth every morning.    . Multiple Vitamin (MULTIVITAMIN WITH MINERALS) TABS tablet Take 1 tablet by mouth every morning.    Marland Kitchen NIFEdipine (ADALAT CC) 30 MG 24 hr tablet Take 30 mg by mouth daily after breakfast.    . Omega-3 Fatty Acids (FISH OIL) 1000 MG CAPS Take 1,000 mg by mouth every morning.    . TURMERIC PO Take 1 tablet by mouth daily.    . Eliquis DVT/PE Starter Pack (ELIQUIS STARTER PACK) 5 MG TABS Take as directed on package: start with two-5mg  tablets twice daily for 7 days. On day 8, switch to one-5mg  tablet twice daily. 1 each 0   No facility-administered medications prior to visit.         Objective:   Physical Exam Vitals:   06/19/19 1412  BP: 112/74  Pulse: 70  SpO2: 98%  Weight: 196 lb (88.9 kg)  Height: 5' (1.524 m)   Gen: Pleasant, overwt woman, in no distress,  normal affect  ENT: No lesions,  mouth clear,  oropharynx clear, no postnasal drip  Neck: No JVD, no stridor  Lungs: No use of accessory muscles, no crackles or wheezing on normal respiration, no wheeze on forced expiration  Cardiovascular: RRR, heart sounds normal, no murmur or gallops, trace LE peripheral edema especially feet  Musculoskeletal: No  deformities, no cyanosis or clubbing  Neuro: alert, awake, non focal  Skin: Warm, no lesions or rash     Assessment & Plan:  Pulmonary vascular disease ([place]) Significant pulmonary hypertension noted on echocardiogram at the time of admission for acute submassive pulmonary embolism.  Gladly her repeat echocardiogram yesterday shows that her pulmonary hypertension has resolved, intact RV function.  This is great news.  I reassured her about this.  Pulmonary embolism ([facility]) Submassive pulmonary embolism, apparently  unprovoked in July.  She is tolerating Eliquis.  Based on her history of malignancy, the unprovoked nature of the clot and her sedentary lifestyle I think it would be best for her to remain on anticoagulation for life as long as she can tolerate.  She is following with Dr. Jana Hakim.  Obstructive sleep apnea This was discovered pulmonary hypertension evaluation was undertaken.  She had polysomnogram at [facility] that she reports showed an AHI of 19/h.  I do not have the study available to me right now.  She is planning to start CPAP.  I do not know if that is with oxygen bled in or not.  I suspect that it is CPAP alone.  I have encouraged her to start and get stable on the CPAP.  Once this is accomplished we will repeat an overnight oximetry on room air plus CPAP to see if her hypoxemic events have resolved.  If not she may need to have oxygen bled in.  Agree with trying CPAP as you are planning to do. Recommend doing this initially on room air (without supplemental oxygen). Once stable we will perform an overnight oximetry on CPAP to insure that this is adequate. If not, then we will restart oxygen to bleed into your CPAP system.  Follow with Dr Lamonte Sakai in 4 months or sooner if you have any problems.  Baltazar Apo, MD, PhD 06/19/2019, 4:45 PM [facility] [phone] or if no answer [phone]

## 2019-06-19 NOTE — Assessment & Plan Note (Signed)
Significant pulmonary hypertension noted on echocardiogram at the time of admission for acute submassive pulmonary embolism.  Gladly her repeat echocardiogram yesterday shows that her pulmonary hypertension has resolved, intact RV function.  This is great news.  I reassured her about this.

## 2019-06-19 NOTE — Patient Instructions (Addendum)
Your echocardiogram has normalized. This is good news.  Agree with continuing Eliquis for life as long as you are able to tolerate the medication.  Agree with trying CPAP as you are planning to do. Recommend doing this initially on room air (without supplemental oxygen). Once stable we will perform an overnight oximetry on CPAP to insure that this is adequate. If not, then we will restart oxygen to bleed into your CPAP system.  Follow with Dr Lamonte Sakai in 4 months or sooner if you have any problems.

## 2019-06-19 NOTE — Assessment & Plan Note (Signed)
Submassive pulmonary embolism, apparently unprovoked in July.  She is tolerating Eliquis.  Based on her history of malignancy, the unprovoked nature of the clot and her sedentary lifestyle I think it would be best for her to remain on anticoagulation for life as long as she can tolerate.  She is following with Dr. Jana Hakim.

## 2019-06-19 NOTE — Assessment & Plan Note (Signed)
This was discovered pulmonary hypertension evaluation was undertaken.  She had polysomnogram at [facility] that she reports showed an AHI of 19/h.  I do not have the study available to me right now.  She is planning to start CPAP.  I do not know if that is with oxygen bled in or not.  I suspect that it is CPAP alone.  I have encouraged her to start and get stable on the CPAP.  Once this is accomplished we will repeat an overnight oximetry on room air plus CPAP to see if her hypoxemic events have resolved.  If not she may need to have oxygen bled in.  Agree with trying CPAP as you are planning to do. Recommend doing this initially on room air (without supplemental oxygen). Once stable we will perform an overnight oximetry on CPAP to insure that this is adequate. If not, then we will restart oxygen to bleed into your CPAP system.  Follow with Dr Lamonte Sakai in 4 months or sooner if you have any problems.

## 2019-06-23 DIAGNOSIS — G4733 Obstructive sleep apnea (adult) (pediatric): Secondary | ICD-10-CM | POA: Diagnosis not present

## 2019-07-14 DIAGNOSIS — I2699 Other pulmonary embolism without acute cor pulmonale: Secondary | ICD-10-CM | POA: Diagnosis not present

## 2019-07-23 DIAGNOSIS — G4733 Obstructive sleep apnea (adult) (pediatric): Secondary | ICD-10-CM | POA: Diagnosis not present

## 2019-07-27 NOTE — Progress Notes (Signed)
Cassandra [name]  [phone] [phone]) [phone]    ID: Cassandra [name] DOB: 1947-01-71  MR#: [id number]  LGX#:[id number]  Patient Care Team: Harlan Stains, MD as PCP - General (Family Medicine) Carol Ada, MD as Consulting Physician (Cardiac Rehabilitation) Magrinat, [name], MD as Consulting Physician (Hematology and Oncology) Tyler Pita, MD as Consulting Physician (Radiation Oncology) Gaynelle Arabian, MD as Consulting Physician (Orthopedic Surgery) Collene Gobble, MD as Consulting Physician (Pulmonary Disease) OTHER MD:   CHIEF COMPLAINT: Coagulopathy  CURRENT TREATMENT: apixaban   HISTORY OF CURRENT ILLNESS: Cassandra [name] is a 73 y.o. woman I followed remotely for history of breast cancer, detailed below.  She is now referred referred here by Dr. Carol Ada for follow up to a Pulmonary Embolism and use of anticoagulants.   Cassandra [name] presented to the ED on 04/06/2019 with a two-week history of shortness of breath and dyspnea on exertion.  There have been no provoking incidents other than binging on an [place] TV mystery series, 3 to 4 hours at a time.  The first day she noted shortness of breath she was on her riding mower, March 25, 2019.  As her shortness of breath did not improve she thought she might have the coronavirus and got herself tested.  It took several days for the results to come back but they were negative.  She thensaw Dr. Tamala Julian for additional work-up and was referred to the emergency room.  There blood pressure was in the normal range although perhaps slightly lower than the patient's average. She underwent a chest xray on 04/06/2019 showing: Heart and mediastinal contours are within normal limits. No focal opacities or effusions. No acute bony abnormality.  She then underwent a CT chest angiography with contrast on the same day showing: Extensive bilateral pulmonary emboli  involving all lobes of both lungs, right greater than left. RV to LV  ratio is 1.8, significantly elevated compatible with right heart strain. Heart is upper limits normal in size. Trace right pleural effusion. Patchy ground-glass opacities in the posterior left upper lobe, anteromedial right middle lobe which could reflect early infarcts.  She was started on intravenous heparin and transitioned to apixaban 04/08/2019  Echocardiogram 04/07/2019 showed an ejection fraction of 60-65%, with moderately reduced right ventricular systolic function.  Bilateral lower extremity Dopplers 04/08/2019 found acute deep vein thrombus in the left popliteal vein.  The patient's subsequent history is as detailed below.   HISTORY OF BREAST CANCER: Cassandra [name] has a remote history of breast cancer, status post lumpectomy under [facility] on 01/20/2004. The pathology from this procedure showed ([id number]):  1. Sentinel lymph node biopsy, right axillary node:    - one lymph node, negative for metastatic carcinoma (0 of 1), see comment.   2. Sentinel lymph node biopsy, right axillary node:    - one lymph node, negative for metastatic carcinoma (0 of 1), see comment.   3. Sentinel lymph node biopsy, right axillary node:    - one lymph node, negative for metastatic carcinoma (0 of 1), see comment.   4. Sentinel lymph node biopsy, right axillary node:    - one lymph node, negative for metastatic carcinoma (0 of 1), see comment.   5. Lumpectomy, right breast:    - invasive mammary carcinoma, no special type (ductal), 3.9 cm, msbr grade 3 of 3.    - high grade ductal carcinoma in situ.    - extensive intraductal component negative (jcrt).    - margins negative for tumor (in situ and  invasive).    - no definitive lymphovascular invasion identified.   The tumor was estrogen and progesterone receptor positive, HER-2 nonamplified, with an MIB-1 of 40%.  Adjuvant chemotherapy included cyclophosphamide and doxorubicin in dose dense fashion x4 followed by weekly paclitaxel x12.  This was followed  by adjuvant radiation, which was curtailed a few days because of significant skin issues.  She then received 5 years of anastrozole.  INTERVAL HISTORY: Cassandra [name] is here today for f/u of her coagulopathy and history of breast cancer. She has completed her adjuvant antiestrogen therapy for her h/o breast cancer.  She is doing well.  Her last mammogram was in 05/2019 and showed no evidence of malignancy and breast density category B.    Cassandra [name] also has h/o DVT/coagulopathy.  She continues on Apixaban BID with good tolerance.  Since her last visit she saw Dr. Lamonte Sakai. He reviewed her echo which was normal.  He didn't do a repeat CT Scan, since the echo had improved.  She also has started on CPAP for her sleep apnea.    REVIEW OF SYSTEMS: Cassandra [name] is working on losing weight.  She is down 5 pounds.  She denies any new issues such as fever, chills, chest pain, palpitations, cough, bowel/bladder changes, nausea, vomiting.  She is not having any easy bruising or bleeding.  A detailed ROS was otherwise non contributory.    PAST MEDICAL HISTORY: Past Medical History:  Diagnosis Date  . [facility])    Breast cancer right '06- surgery, chemo, radiation- released after 5 yrs  . Hx of seasonal allergies    OTC med used  . Hypertension   . Hypothyroidism      PAST SURGICAL HISTORY: Past Surgical History:  Procedure Laterality Date  . BREAST SURGERY Right    modified partial mastectomy/ lymph nodes dissection  . COLON SURGERY  2/17  . INCISIONAL HERNIA REPAIR N/A 04/06/2016   Procedure: LAPAROSCOPIC ASSISTED INCISIONAL HERNIA REPAIR WITH MESH;  Surgeon: Leighton Ruff, MD;  Location: [facility];  Service: General;  Laterality: N/A;  . TONSILLECTOMY       FAMILY HISTORY: Family History  Problem Relation Age of Onset  . COPD Father    Cassandra [name]'s father died from lung cancer at age 56 her mother died from lung cancer at age 52.  Both were smokers.  She had 2 brothers no sisters.  One brother died from myocardial  infarction.  The other one has had CVA.  Both also were smokers.  There is no history of breast or ovarian cancer in the family.   GYNECOLOGIC HISTORY:  No LMP recorded. Patient is postmenopausal. Menarche: 73 years old Cassandra [name] P0 LMP: Mid 42s No hysterectomy or salpingo-oophorectomy   SOCIAL HISTORY: (Current as of 07/28/2019) Cassandra [name] worked as a Pharmacist, [employer].  "I planned my life as a long vacation."  She lives alone with 2 cats in approximately 20 acres.  She is not a church attender   ADVANCED DIRECTIVES: Her neighbor Cassandra [name] is her healthcare power of attorney.  Cassandra [name] can be reached at [phone]   HEALTH MAINTENANCE: Social History   Tobacco Use  . Smoking status: Never Smoker  . Smokeless tobacco: Never Used  Substance Use Topics  . Alcohol use: Yes    Comment: rare  . Drug use: No    Colonoscopy: 2015  PAP: "No more"  Bone density:  Mammography: At [facility]  No Known Allergies  Current Outpatient Medications  Medication Sig Dispense Refill  .  apixaban (ELIQUIS) 5 MG TABS tablet Take 5 mg by mouth 2 (two) times daily.    Marland Kitchen atorvastatin (LIPITOR) 80 MG tablet Take 80 mg by mouth every evening.    . benazepril (LOTENSIN) 40 MG tablet Take 40 mg by mouth at bedtime.     . bisoprolol-hydrochlorothiazide (ZIAC) 10-6.25 MG tablet Take 1 tablet by mouth every morning.    Marland Kitchen CALCIUM PO Take 1 tablet by mouth 2 (two) times daily.    . Cholecalciferol (VITAMIN D3) 2000 units capsule Take 2,000 Units by mouth every morning.    . Coenzyme Q10 (CO Q 10 PO) Take 1 tablet by mouth every morning.    Marland Kitchen levothyroxine (SYNTHROID, LEVOTHROID) 100 MCG tablet Take 100 mcg by mouth daily before breakfast.    . loratadine (CLARITIN) 10 MG tablet Take 10 mg by mouth every morning.    Marland Kitchen Lysine 500 MG CAPS Take 500 mg by mouth every morning.    . Multiple Vitamin (MULTIVITAMIN WITH MINERALS) TABS tablet Take 1 tablet by mouth every morning.    Marland Kitchen NIFEdipine (ADALAT CC) 30  MG 24 hr tablet Take 30 mg by mouth daily after breakfast.    . Omega-3 Fatty Acids (FISH OIL) 1000 MG CAPS Take 1,000 mg by mouth every morning.    . TURMERIC PO Take 1 tablet by mouth daily.    Marland Kitchen albuterol (VENTOLIN HFA) 108 (90 Base) MCG/ACT inhaler Inhale 2 puffs into the lungs every 4 (four) hours as needed for wheezing or shortness of breath.      No current facility-administered medications for this visit.      OBJECTIVE: Obese white woman who appears stated age  69:   07/28/19 1331  BP: (!) 144/64  Pulse: 62  Resp: 18  Temp: 98.7 F (37.1 C)  SpO2: 99%     Body mass index is 37.4 kg/m.   Wt Readings from Last 3 Encounters:  07/28/19 191 lb 8 oz (86.9 kg)  06/19/19 196 lb (88.9 kg)  05/14/19 192 lb 8 oz (87.3 kg)      ECOG FS:1 - Symptomatic but completely ambulatory  GENERAL: Patient is a well appearing female in no acute distress HEENT:  Sclerae anicteric.  Oropharynx clear and moist. No ulcerations or evidence of oropharyngeal candidiasis. Neck is supple.  NODES:  No cervical, supraclavicular, or axillary lymphadenopathy palpated.  BREAST EXAM:  Left breast s/p lumpectomy, no sign of local recurrence, right breast benign LUNGS:  Clear to auscultation bilaterally.  No wheezes or rhonchi. HEART:  Regular rate and rhythm. No murmur appreciated. ABDOMEN:  Soft, nontender.  Positive, normoactive bowel sounds. No organomegaly palpated. MSK:  No focal spinal tenderness to palpation. Full range of motion bilaterally in the upper extremities. EXTREMITIES:  No peripheral edema.   SKIN:  Clear with no obvious rashes or skin changes. No nail dyscrasia. NEURO:  Nonfocal. Well oriented.  Appropriate affect.     [name] RESULTS:  CMP     Component Value Date/Time   NA 141 07/28/2019 1309   K 4.7 07/28/2019 1309   CL 104 07/28/2019 1309   CO2 28 07/28/2019 1309   GLUCOSE 93 07/28/2019 1309   BUN 13 07/28/2019 1309   CREATININE 0.90 07/28/2019 1309   CALCIUM 10.2  07/28/2019 1309   PROT 7.2 07/28/2019 1309   ALBUMIN 4.0 07/28/2019 1309   AST 28 07/28/2019 1309   ALT 22 07/28/2019 1309   ALKPHOS 80 07/28/2019 1309   BILITOT 0.5 07/28/2019 1309  GFRNONAA >60 07/28/2019 1309   GFRAA >60 07/28/2019 1309    No results found for: TOTALPROTELP, ALBUMINELP, A1GS, A2GS, BETS, BETA2SER, GAMS, MSPIKE, SPEI  No results found for: Nils Pyle, [facility]  [name] Results  Component Value Date   WBC 8.5 07/28/2019   NEUTROABS 3.8 07/28/2019   HGB 13.1 07/28/2019   HCT 40.5 07/28/2019   MCV 94.6 07/28/2019   PLT 365 07/28/2019    '@LASTCHEMISTRY' @  [name] Results  Component Value Date   LABCA2 17 01/09/2010    No components found for: [id number]  No results for input(s): INR in the last 168 hours.  [name] Results  Component Value Date   LABCA2 17 01/09/2010    No results found for: AES975  [name] Results  Component Value Date   CAN125 8.8 05/07/2019    No results found for: PYY511  [name] Results  Component Value Date   CA2729 14.6 05/07/2019    No components found for: HGQUANT  [name] Results  Component Value Date   CEA1 1.41 05/07/2019   /  CEA ([facility])  Date Value Ref Range Status  05/07/2019 1.41 0.00 - 5.00 ng/mL Final    Comment:    (NOTE) This test was performed using Architect's Chemiluminescent Microparticle Immunoassay. Values obtained from different assay methods cannot be used interchangeably. Please note that 5-10% of patients who smoke may see CEA levels up to 6.9 ng/mL. Performed at [facility], [place] [address] [place], [place] [address]      No results found for: AFPTUMOR  No results found for: [place]  No results found for: PSA1  Appointment on 07/28/2019  Component Date Value Ref Range Status  . Sodium 07/28/2019 141  135 - 145 mmol/L Final  . Potassium 07/28/2019 4.7  3.5 - 5.1 mmol/L Final  . Chloride 07/28/2019 104  98 - 111 mmol/L Final  . CO2 07/28/2019  28  22 - 32 mmol/L Final  . Glucose, Bld 07/28/2019 93  70 - 99 mg/dL Final  . BUN 07/28/2019 13  8 - 23 mg/dL Final  . Creatinine, Ser 07/28/2019 0.90  0.44 - 1.00 mg/dL Final  . Calcium 07/28/2019 10.2  8.9 - 10.3 mg/dL Final  . Total Protein 07/28/2019 7.2  6.5 - 8.1 g/dL Final  . Albumin 07/28/2019 4.0  3.5 - 5.0 g/dL Final  . AST 07/28/2019 28  15 - 41 U/L Final  . ALT 07/28/2019 22  0 - 44 U/L Final  . Alkaline Phosphatase 07/28/2019 80  38 - 126 U/L Final  . Total Bilirubin 07/28/2019 0.5  0.3 - 1.2 mg/dL Final  . GFR calc non Af Amer 07/28/2019 >60  >60 mL/min Final  . GFR calc Af Amer 07/28/2019 >60  >60 mL/min Final  . Anion gap 07/28/2019 9  5 - 15 Final   Performed at [facility], [place] [address] [place], [place] [address]  . WBC 07/28/2019 8.5  4.0 - 10.5 K/uL Final  . RBC 07/28/2019 4.28  3.87 - 5.11 MIL/uL Final  . Hemoglobin 07/28/2019 13.1  12.0 - 15.0 g/dL Final  . HCT 07/28/2019 40.5  36.0 - 46.0 % Final  . MCV 07/28/2019 94.6  80.0 - 100.0 fL Final  . MCH 07/28/2019 30.6  26.0 - 34.0 pg Final  . MCHC 07/28/2019 32.3  30.0 - 36.0 g/dL Final  . RDW 07/28/2019 15.6* 11.5 - 15.5 % Final  . Platelets 07/28/2019 365  150 - 400 K/uL Final  .  nRBC 07/28/2019 0.0  0.0 - 0.2 % Final  . Neutrophils Relative % 07/28/2019 44  % Final  . Neutro Abs 07/28/2019 3.8  1.7 - 7.7 K/uL Final  . Lymphocytes Relative 07/28/2019 44  % Final  . Lymphs Abs 07/28/2019 3.8  0.7 - 4.0 K/uL Final  . Monocytes Relative 07/28/2019 9  % Final  . Monocytes Absolute 07/28/2019 0.7  0.1 - 1.0 K/uL Final  . Eosinophils Relative 07/28/2019 2  % Final  . Eosinophils Absolute 07/28/2019 0.2  0.0 - 0.5 K/uL Final  . Basophils Relative 07/28/2019 1  % Final  . Basophils Absolute 07/28/2019 0.1  0.0 - 0.1 K/uL Final  . Immature Granulocytes 07/28/2019 0  % Final  . Abs Immature Granulocytes 07/28/2019 0.02  0.00 - 0.07 K/uL Final   Performed at [facility], [place] [address], [place], [place] [address]    (this displays the last labs from the last 3 days)  No results found for: TOTALPROTELP, ALBUMINELP, A1GS, A2GS, BETS, BETA2SER, GAMS, MSPIKE, SPEI (this displays SPEP labs)  No results found for: KPAFRELGTCHN, LAMBDASER, KAPLAMBRATIO (kappa/lambda light chains)  No results found for: HGBA, HGBA2QUANT, HGBFQUANT, HGBSQUAN (Hemoglobinopathy evaluation)   No results found for: LDH  No results found for: IRON, TIBC, IRONPCTSAT (Iron and TIBC)  No results found for: FERRITIN  Urinalysis    Component Value Date/Time   COLORURINE YELLOW 04/07/2019 0400   APPEARANCEUR CLEAR 04/07/2019 0400   LABSPEC >1.046 (H) 04/07/2019 0400   PHURINE 5.0 04/07/2019 0400   GLUCOSEU NEGATIVE 04/07/2019 0400   HGBUR NEGATIVE 04/07/2019 0400   BILIRUBINUR NEGATIVE 04/07/2019 0400   KETONESUR NEGATIVE 04/07/2019 0400   PROTEINUR NEGATIVE 04/07/2019 0400   NITRITE NEGATIVE 04/07/2019 0400   LEUKOCYTESUR NEGATIVE 04/07/2019 0400     STUDIES:  No results found.   ELIGIBLE FOR AVAILABLE RESEARCH PROTOCOL: no   ASSESSMENT: 73 y.o. Cassandra [name], [place] woman   (1) status post right lumpectomy and sentinel lymph node sampling 01/20/2004 for a pT2 pN0, stage IIA invasive ductal carcinoma, grade 3, estrogen and progesterone receptor positive, HER-2 not amplified  (a) a total of 4 right axillary lymph nodes were removed  (b) status post chemotherapy  (c) status post tamoxifen  (d) status post adjuvant radiation  (2) bilateral submassive pulmonary embolus 04/06/2019 with right lower extremity DVT documented 04/08/2019,   (a) no obvious provoking incident  (b) screening tumor markers (CEA, CA-125, CA 27-29) negative  (c) risk factors include obesity and relatively sedentary lifestyle  (3) treated initially with intravenous heparin starting 04/06/2019  (a) transitioned to apixaban 5 mg twice daily on 04/08/2019  (b) lifelong anticoagulation  recommended   PLAN:  Zamorah is doing well today.  She is clinically and radiographically without any sign of breast cancer recurrence.  She continues on observation alone.  She will be due for next mammogram in 05/2020.  Bertina continues on Apixaban with good tolerance.  I reviewed with her that her occasional aches and pains, are likely unrelated to the apixaban.  She will continue this at her current dosing.  She will return in 4 months for labs and f/u with Dr. Jana Hakim to discuss dosing and potential dose reductions in detail.  She will also discuss whether or not imaging is needed.    She will continue to f/u with pulmonology about her shortness of breath and sleep apnea.    Cassandra [name] and I reviewed healthy diet and exercise which I recommended.  She was recommended  to continue with the appropriate pandemic precautions. She knows to call for any questions that may arise between now and her next appointment.  We are happy to see her sooner if needed.  A total of (30) minutes of face-to-face time was spent with this patient with greater than 50% of that time in counseling and care-coordination.  Wilber Bihari, NP 07/28/19 2:17 PM Medical Oncology and Hematology [facility] [address] [place], [place] [address] Tel. [phone]    Fax. [phone]

## 2019-07-28 ENCOUNTER — Inpatient Hospital Stay: Payer: Medicare HMO | Attending: Oncology | Admitting: Adult Health

## 2019-07-28 ENCOUNTER — Encounter: Payer: Self-pay | Admitting: Adult Health

## 2019-07-28 ENCOUNTER — Inpatient Hospital Stay: Payer: Medicare HMO

## 2019-07-28 ENCOUNTER — Other Ambulatory Visit: Payer: Self-pay

## 2019-07-28 VITALS — BP 144/64 | HR 62 | Temp 98.7°F | Resp 18 | Ht 60.0 in | Wt 191.5 lb

## 2019-07-28 DIAGNOSIS — I2782 Chronic pulmonary embolism: Secondary | ICD-10-CM

## 2019-07-28 DIAGNOSIS — C50811 Malignant neoplasm of overlapping sites of right female breast: Secondary | ICD-10-CM

## 2019-07-28 DIAGNOSIS — I2699 Other pulmonary embolism without acute cor pulmonale: Secondary | ICD-10-CM | POA: Diagnosis not present

## 2019-07-28 DIAGNOSIS — D689 Coagulation defect, unspecified: Secondary | ICD-10-CM

## 2019-07-28 DIAGNOSIS — I10 Essential (primary) hypertension: Secondary | ICD-10-CM | POA: Insufficient documentation

## 2019-07-28 DIAGNOSIS — I749 Embolism and thrombosis of unspecified artery: Secondary | ICD-10-CM

## 2019-07-28 DIAGNOSIS — Z7901 Long term (current) use of anticoagulants: Secondary | ICD-10-CM | POA: Diagnosis not present

## 2019-07-28 DIAGNOSIS — Z86718 Personal history of other venous thrombosis and embolism: Secondary | ICD-10-CM | POA: Insufficient documentation

## 2019-07-28 DIAGNOSIS — J90 Pleural effusion, not elsewhere classified: Secondary | ICD-10-CM | POA: Insufficient documentation

## 2019-07-28 DIAGNOSIS — I2690 Septic pulmonary embolism without acute cor pulmonale: Secondary | ICD-10-CM

## 2019-07-28 DIAGNOSIS — Z170 Estrogen receptor positive status [ER+]: Secondary | ICD-10-CM | POA: Diagnosis not present

## 2019-07-28 DIAGNOSIS — I289 Disease of pulmonary vessels, unspecified: Secondary | ICD-10-CM

## 2019-07-28 LAB — CBC WITH DIFFERENTIAL/PLATELET
Abs Immature Granulocytes: 0.02 10*3/uL (ref 0.00–0.07)
Basophils Absolute: 0.1 10*3/uL (ref 0.0–0.1)
Basophils Relative: 1 %
Eosinophils Absolute: 0.2 10*3/uL (ref 0.0–0.5)
Eosinophils Relative: 2 %
HCT: 40.5 % (ref 36.0–46.0)
Hemoglobin: 13.1 g/dL (ref 12.0–15.0)
Immature Granulocytes: 0 %
Lymphocytes Relative: 44 %
Lymphs Abs: 3.8 10*3/uL (ref 0.7–4.0)
MCH: 30.6 pg (ref 26.0–34.0)
MCHC: 32.3 g/dL (ref 30.0–36.0)
MCV: 94.6 fL (ref 80.0–100.0)
Monocytes Absolute: 0.7 10*3/uL (ref 0.1–1.0)
Monocytes Relative: 9 %
Neutro Abs: 3.8 10*3/uL (ref 1.7–7.7)
Neutrophils Relative %: 44 %
Platelets: 365 10*3/uL (ref 150–400)
RBC: 4.28 MIL/uL (ref 3.87–5.11)
RDW: 15.6 % — ABNORMAL HIGH (ref 11.5–15.5)
WBC: 8.5 10*3/uL (ref 4.0–10.5)
nRBC: 0 % (ref 0.0–0.2)

## 2019-07-28 LAB — COMPREHENSIVE METABOLIC PANEL
ALT: 22 U/L (ref 0–44)
AST: 28 U/L (ref 15–41)
Albumin: 4 g/dL (ref 3.5–5.0)
Alkaline Phosphatase: 80 U/L (ref 38–126)
Anion gap: 9 (ref 5–15)
BUN: 13 mg/dL (ref 8–23)
CO2: 28 mmol/L (ref 22–32)
Calcium: 10.2 mg/dL (ref 8.9–10.3)
Chloride: 104 mmol/L (ref 98–111)
Creatinine, Ser: 0.9 mg/dL (ref 0.44–1.00)
GFR calc Af Amer: >60 mL/min (ref >60)
GFR calc non Af Amer: >60 mL/min (ref >60)
Glucose, Bld: 93 mg/dL (ref 70–99)
Potassium: 4.7 mmol/L (ref 3.5–5.1)
Sodium: 141 mmol/L (ref 135–145)
Total Bilirubin: 0.5 mg/dL (ref 0.3–1.2)
Total Protein: 7.2 g/dL (ref 6.5–8.1)

## 2019-07-29 ENCOUNTER — Telehealth: Payer: Self-pay | Admitting: Oncology

## 2019-07-29 IMAGING — DX PORTABLE CHEST - 1 VIEW
1 series · 1 of 1 positions shown · non-contrast
Comparison: None.

CLINICAL DATA: Shortness of breath, chest pain, fatigue, cough

EXAM:
PORTABLE CHEST 1 VIEW

[chest ap]
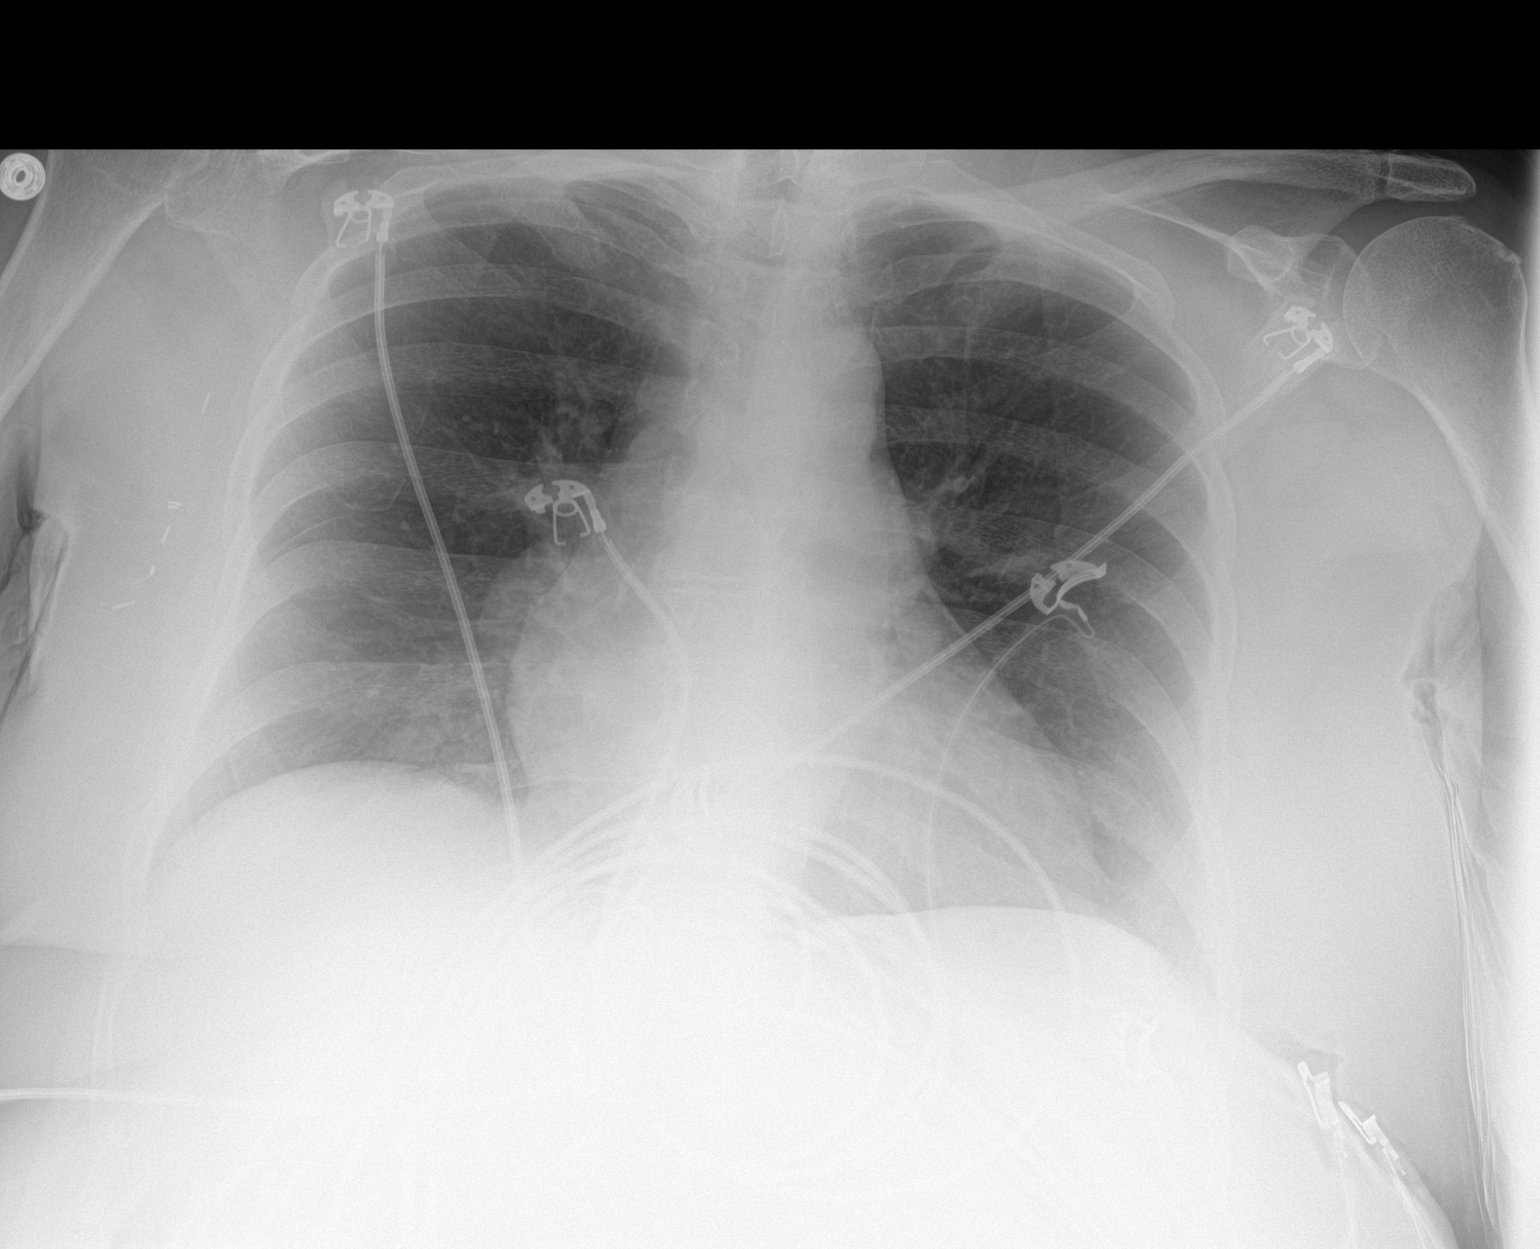

[1 of 1 positions shown; findings below may reference images not displayed]

FINDINGS: Heart and mediastinal contours are within normal limits. No focal
opacities or effusions. No acute bony abnormality.
IMPRESSION: No active disease.

## 2019-07-29 NOTE — Telephone Encounter (Signed)
I talk with patient regarding schedule  

## 2019-08-13 DIAGNOSIS — G4721 Circadian rhythm sleep disorder, delayed sleep phase type: Secondary | ICD-10-CM | POA: Diagnosis not present

## 2019-08-13 DIAGNOSIS — I10 Essential (primary) hypertension: Secondary | ICD-10-CM | POA: Diagnosis not present

## 2019-08-13 DIAGNOSIS — G4734 Idiopathic sleep related nonobstructive alveolar hypoventilation: Secondary | ICD-10-CM | POA: Diagnosis not present

## 2019-08-13 DIAGNOSIS — G4733 Obstructive sleep apnea (adult) (pediatric): Secondary | ICD-10-CM | POA: Diagnosis not present

## 2019-08-13 DIAGNOSIS — Z86711 Personal history of pulmonary embolism: Secondary | ICD-10-CM | POA: Diagnosis not present

## 2019-08-14 DIAGNOSIS — I2699 Other pulmonary embolism without acute cor pulmonale: Secondary | ICD-10-CM | POA: Diagnosis not present

## 2019-08-18 ENCOUNTER — Encounter: Payer: Self-pay | Admitting: Emergency Medicine

## 2019-08-23 DIAGNOSIS — G4733 Obstructive sleep apnea (adult) (pediatric): Secondary | ICD-10-CM | POA: Diagnosis not present

## 2019-08-25 DIAGNOSIS — R0902 Hypoxemia: Secondary | ICD-10-CM | POA: Diagnosis not present

## 2019-09-09 DIAGNOSIS — E039 Hypothyroidism, unspecified: Secondary | ICD-10-CM | POA: Diagnosis not present

## 2019-09-09 DIAGNOSIS — Z86711 Personal history of pulmonary embolism: Secondary | ICD-10-CM | POA: Diagnosis not present

## 2019-09-09 DIAGNOSIS — I10 Essential (primary) hypertension: Secondary | ICD-10-CM | POA: Diagnosis not present

## 2019-09-09 DIAGNOSIS — E785 Hyperlipidemia, unspecified: Secondary | ICD-10-CM | POA: Diagnosis not present

## 2019-09-13 DIAGNOSIS — I2699 Other pulmonary embolism without acute cor pulmonale: Secondary | ICD-10-CM | POA: Diagnosis not present

## 2019-09-16 DIAGNOSIS — E785 Hyperlipidemia, unspecified: Secondary | ICD-10-CM | POA: Diagnosis not present

## 2019-09-16 DIAGNOSIS — E039 Hypothyroidism, unspecified: Secondary | ICD-10-CM | POA: Diagnosis not present

## 2019-09-22 DIAGNOSIS — G4733 Obstructive sleep apnea (adult) (pediatric): Secondary | ICD-10-CM | POA: Diagnosis not present

## 2019-09-28 ENCOUNTER — Telehealth: Payer: Self-pay | Admitting: Emergency Medicine

## 2019-09-28 DIAGNOSIS — G4733 Obstructive sleep apnea (adult) (pediatric): Secondary | ICD-10-CM

## 2019-09-28 NOTE — Telephone Encounter (Signed)
Still no fax- ATC her back and NA

## 2019-09-28 NOTE — Telephone Encounter (Signed)
[name] returned call hung up before I could get number.Hillery Hunter

## 2019-09-28 NOTE — Telephone Encounter (Signed)
LMTCB for Cassandra [name]

## 2019-09-28 NOTE — Telephone Encounter (Signed)
Attempted to call [name] with Cassandra [name] but unable to reach. Left message for her to return call.

## 2019-09-28 NOTE — Telephone Encounter (Signed)
Cassandra [name] from [facility] is returning phone call.  Cassandra [name] phone number is [phone].

## 2019-09-28 NOTE — Telephone Encounter (Signed)
[name] returned call about pt's recent overnight O2 test. Per [name], pt's test was performed on her CPAP alone as pt is no longer on O2. [name] said that it showed pt's O2 sats to be at lowest of 79% on cpap.  [name] said that she had faxed the results to our office this morning 1/4 but I stated to her that we had not received them. She said she would refax the results to our office in Dr. Agustina Caroli attn. Will keep encounter open until we have received the fax.

## 2019-09-30 NOTE — Telephone Encounter (Signed)
Form has been received and is in RB's look-at folder in B pod.  RB please advise.  Thanks!

## 2019-10-05 NOTE — Telephone Encounter (Signed)
Yes, need to add back 2L/min bled in with her CPAP through [facility] based on the ONO results. Thanks you

## 2019-10-05 NOTE — Telephone Encounter (Signed)
LVMTCB x 1 for patient to call back so she can be made aware of the results received from [facility].  Order placed for [facility] to add the 2L/Min bled into CPAP per Dr. Lamonte Sakai. Nothing further needed at this time.

## 2019-10-06 ENCOUNTER — Telehealth: Payer: Self-pay | Admitting: Emergency Medicine

## 2019-10-06 NOTE — Telephone Encounter (Signed)
Collene Gobble, MD to [facility] Triage Pool     1:22 PM Note   Yes, need to add back 2L/min bled in with her CPAP through [facility] based on the ONO results. Thanks you     Called and spoke with pt letting her know that [facility] said she needed to have 2L O2 bled back into her cpap based on the results of the ONO. Pt verbalized understanding. Saw that pt was due for a f/u with RB so I scheduled her an appt 1/14 at 12pm for a televisit. Nothing further needed.

## 2019-10-08 ENCOUNTER — Other Ambulatory Visit: Payer: Self-pay

## 2019-10-08 ENCOUNTER — Ambulatory Visit (INDEPENDENT_AMBULATORY_CARE_PROVIDER_SITE_OTHER): Payer: Medicare HMO | Admitting: Emergency Medicine

## 2019-10-08 ENCOUNTER — Encounter: Payer: Self-pay | Admitting: Emergency Medicine

## 2019-10-08 DIAGNOSIS — G4733 Obstructive sleep apnea (adult) (pediatric): Secondary | ICD-10-CM | POA: Diagnosis not present

## 2019-10-08 NOTE — Progress Notes (Signed)
Virtual Visit via Telephone Note  I connected with Cassandra [name] on 10/08/19 at 12:00 PM EST by telephone and verified that I am speaking with the correct person using two identifiers.  Location: Patient: Home  Provider: Office   I discussed the limitations, risks, security and privacy concerns of performing an evaluation and management service by telephone and the availability of in person appointments. I also discussed with the patient that there may be a patient responsible charge related to this service. The patient expressed understanding and agreed to proceed.   History of Present Illness: 74 year old woman with history of breast cancer, hypertension, hypothyroidism.  I met her after she had a pulmonary embolism in July 2020 associated with right heart dysfunction and associated pulmonary hypertension.  She was anticoagulated.  Part of her subsequent work-up included a polysomnogram that showed moderate obstructive sleep apnea and she was started on CPAP therapy.  Her pulmonary pressures normalized on follow-up echocardiogram done 06/18/2019.  Was ultimately decided that she would remain on Eliquis lifelong if possible based on her history of malignancy, risk factors. May be changing to prophylactic dose going forward   Observations/Objective: Since last time she had an overnight oximetry on her CPAP plus room air (with which she is compliant) that identified persistent desaturations.  For this reason I ordered 2 L/min to be bled in with her CPAP. She is is wondering if this is really necessary, whether there may have been artifact.   Today she reports that her breathing is doing well. She has been doing a lot less due to isolation efforts, feels that her conditioning is suffering for this.   Assessment and Plan: OSA with nocturnal hypoxemia: I need to review the raw data - determine whether the plan for O2 makes sense. If so then we will plan to repeat her ONO on 2L/min + CPAP in late  January or early February.   PE:  Planning to possibly change to Eliquis 2.62m, discussinkg with Dr MGriffith Citronwith Oncology   Secondary PAH, resolved  Follow Up Instructions: I will call her to review the ONO results further    I discussed the assessment and treatment plan with the patient. The patient was provided an opportunity to ask questions and all were answered. The patient agreed with the plan and demonstrated an understanding of the instructions.   The patient was advised to call back or seek an in-person evaluation if the symptoms worsen or if the condition fails to improve as anticipated.  I provided 23 minutes of non-face-to-face time during this encounter.   RCollene Gobble MD

## 2019-10-09 ENCOUNTER — Telehealth: Payer: Self-pay | Admitting: Emergency Medicine

## 2019-10-09 DIAGNOSIS — G4733 Obstructive sleep apnea (adult) (pediatric): Secondary | ICD-10-CM

## 2019-10-09 NOTE — Telephone Encounter (Signed)
Attempted to call pt about her ONO on CPAP + RA to extend on our discussion from her recent OV; line busy.     I was able to reach her and review:   Shows 90 saturations an hour, average was to 88%.  The lowest desaturation was 80%.  The overall lowest saturation was 73% (question whether this was artifact, the tracing is not available). She spent 51% of the study less than 88%, 146 minutes less than 85%, 9 minutes less than 80%.    Based on the amount of time spent below 88%, I do not believe that all of this could have been artifact.  I will recommend that she continue the continuous oxygen at 2 L/min bled into her CPAP. I'll send her a copy of the report.  We can repeat her overnight oximetry with the addition of oxygen 2L/min in 2 to 3 weeks.  Please order repeat ONO on her CPAP + 2L/min in about 2 weeks. Thanks.

## 2019-10-09 NOTE — Telephone Encounter (Signed)
Order has been placed per RB. 

## 2019-10-14 DIAGNOSIS — I2699 Other pulmonary embolism without acute cor pulmonale: Secondary | ICD-10-CM | POA: Diagnosis not present

## 2019-10-19 DIAGNOSIS — I10 Essential (primary) hypertension: Secondary | ICD-10-CM | POA: Diagnosis not present

## 2019-10-19 DIAGNOSIS — E039 Hypothyroidism, unspecified: Secondary | ICD-10-CM | POA: Diagnosis not present

## 2019-10-19 DIAGNOSIS — M8580 Other specified disorders of bone density and structure, unspecified site: Secondary | ICD-10-CM | POA: Diagnosis not present

## 2019-10-19 DIAGNOSIS — E785 Hyperlipidemia, unspecified: Secondary | ICD-10-CM | POA: Diagnosis not present

## 2019-10-21 ENCOUNTER — Encounter: Payer: Self-pay | Admitting: Emergency Medicine

## 2019-10-21 DIAGNOSIS — J449 Chronic obstructive pulmonary disease, unspecified: Secondary | ICD-10-CM | POA: Diagnosis not present

## 2019-10-21 DIAGNOSIS — R0902 Hypoxemia: Secondary | ICD-10-CM | POA: Diagnosis not present

## 2019-10-22 ENCOUNTER — Telehealth: Payer: Self-pay | Admitting: Emergency Medicine

## 2019-10-22 NOTE — Telephone Encounter (Signed)
Ria Comment, this has been sent to you as an [place].  The referral sent to DME dated 10/05/19 stated "Please add back 2L/min bled in with patient CPAP based on the ONO results received, at the request of Dr. Lamonte Sakai.  Results were faxed to his attention from [facility]"  I called the patient back to make her aware of this.   Patient requested a copy of the information because last time it was done she had to call to request the information.

## 2019-10-23 DIAGNOSIS — G4733 Obstructive sleep apnea (adult) (pediatric): Secondary | ICD-10-CM | POA: Diagnosis not present

## 2019-11-03 ENCOUNTER — Telehealth: Payer: Self-pay | Admitting: Emergency Medicine

## 2019-11-03 NOTE — Telephone Encounter (Signed)
Please let her know that I reviewed her ONO >> shows that she still had a period of low oxygenation of ~25 minutes while on CPAP + 2L/min.   I would like for her to increase the O2 to 2.5L/min, continue it with CPAP every night.   Thanks.

## 2019-11-04 NOTE — Telephone Encounter (Signed)
Spoke with pt. She is aware of her results. States that she will increase her oxygen and not to send an order to her DME. Pt also would like to know if we will repeat the ONO on CPAP/2.5L?

## 2019-11-05 NOTE — Telephone Encounter (Signed)
It's been sent to be scanned

## 2019-11-05 NOTE — Telephone Encounter (Signed)
Based on the desaturation time and the desaturation amounts observed on the 2L/min, I think we are safe to increase to 2.5L/min and proceed without a re-check.

## 2019-11-05 NOTE — Telephone Encounter (Signed)
Spoke with the pt  She still would like a copy  Will need to hold this msg until the report has been scanned in

## 2019-11-05 NOTE — Telephone Encounter (Signed)
Spoke with Cassandra [name], advised her of message from [place]. She understood and would like a copy of her ONO sent to her address. Ria Comment do you have the hard copy?

## 2019-11-09 NOTE — Telephone Encounter (Signed)
Per pt's chart, this has not been scanned yet.

## 2019-11-14 DIAGNOSIS — I2699 Other pulmonary embolism without acute cor pulmonale: Secondary | ICD-10-CM | POA: Diagnosis not present

## 2019-11-16 NOTE — Telephone Encounter (Signed)
ONO printed from [employer] and mailed to the pt's home address per her request.

## 2019-11-22 DIAGNOSIS — G4733 Obstructive sleep apnea (adult) (pediatric): Secondary | ICD-10-CM | POA: Diagnosis not present

## 2019-11-24 NOTE — Progress Notes (Signed)
[facility]  [phone] [phone]    ID: Cassandra [name] DOB: Aug 20, 1946  MR#: [id number]  QTM#:[id number]  Patient Care Team: Harlan Stains, MD as PCP - General (Family Medicine) Tarren Velardi, [name], MD as Consulting Physician (Hematology and Oncology) Tyler Pita, MD as Consulting Physician (Radiation Oncology) Gaynelle Arabian, MD as Consulting Physician (Orthopedic Surgery) Collene Gobble, MD as Consulting Physician (Pulmonary Disease) OTHER MD:   CHIEF COMPLAINT: Coagulopathy; history of breast cancer  CURRENT TREATMENT: apixaban   INTERVAL HISTORY: Cassandra [name] is here today for follow up of her coagulopathy and remote history of breast cancer.   She continues on Apixaban BID with good tolerance.  She has had no bleeding or bruising problems.  She obtains it for approximately $45 a month.  Her most recent mammography was in 05/2019 at [place].  This showed a breast density category B and no evidence of malignancy.  She continues follow up with Dr. Lamonte Sakai for her shortness of breath and sleep apnea. Her most recent echocardiogram from 06/18/2019 showed an ejection fraction of 65-70%.   REVIEW OF SYSTEMS: Cassandra [name] is tolerating CPAP well.  She is not having significant problems mechanically with it, does not have a dry mouth or dry nose problem, and is sleeping better.  She is not exercising.  She is currently taking care of her neighbor who fractured her arm and Cassandra [name] is doing the cooking and driving.  Cassandra [name] however is not taking walks, and is not using her exercise bike.  She does have significant right knee problems.  A detailed review of systems today was otherwise stable.   HISTORY OF CURRENT ILLNESS: Cassandra [name] is a 74 y.o. woman I followed remotely for history of breast cancer, detailed below.  She is now referred referred here by Dr. Carol Ada for follow up to a Pulmonary Embolism and use of anticoagulants.   Cassandra [name] presented to the ED on  04/06/2019 with a two-week history of shortness of breath and dyspnea on exertion.  There have been no provoking incidents other than binging on an [place] TV mystery series, 3 to 4 hours at a time.  The first day she noted shortness of breath she was on her riding mower, March 25, 2019.  As her shortness of breath did not improve she thought she might have the coronavirus and got herself tested.  It took several days for the results to come back but they were negative.  She thensaw Dr. Tamala Julian for additional work-up and was referred to the emergency room.  There blood pressure was in the normal range although perhaps slightly lower than the patient's average. She underwent a chest xray on 04/06/2019 showing: Heart and mediastinal contours are within normal limits. No focal opacities or effusions. No acute bony abnormality.  She then underwent a CT chest angiography with contrast on the same day showing: Extensive bilateral pulmonary emboli  involving all lobes of both lungs, right greater than left. RV to LV ratio is 1.8, significantly elevated compatible with right heart strain. Heart is upper limits normal in size. Trace right pleural effusion. Patchy ground-glass opacities in the posterior left upper lobe, anteromedial right middle lobe which could reflect early infarcts.  She was started on intravenous heparin and transitioned to apixaban 04/08/2019  Echocardiogram 04/07/2019 showed an ejection fraction of 60-65%, with moderately reduced right ventricular systolic function.  Bilateral lower extremity Dopplers 04/08/2019 found acute deep vein thrombus in the left popliteal vein.  The patient's subsequent history is as detailed  below.   HISTORY OF BREAST CANCER: Cassandra [name] has a remote history of breast cancer, status post lumpectomy under Cassandra [name] on 01/20/2004. The pathology from this procedure showed ([id number]):  1. Sentinel lymph node biopsy, right axillary node:    - one lymph node, negative  for metastatic carcinoma (0 of 1), see comment.   2. Sentinel lymph node biopsy, right axillary node:    - one lymph node, negative for metastatic carcinoma (0 of 1), see comment.   3. Sentinel lymph node biopsy, right axillary node:    - one lymph node, negative for metastatic carcinoma (0 of 1), see comment.   4. Sentinel lymph node biopsy, right axillary node:    - one lymph node, negative for metastatic carcinoma (0 of 1), see comment.   5. Lumpectomy, right breast:    - invasive mammary carcinoma, no special type (ductal), 3.9 cm, msbr grade 3 of 3.    - high grade ductal carcinoma in situ.    - extensive intraductal component negative (jcrt).    - margins negative for tumor (in situ and invasive).    - no definitive lymphovascular invasion identified.   The tumor was estrogen and progesterone receptor positive, HER-2 nonamplified, with an MIB-1 of 40%.  Adjuvant chemotherapy included cyclophosphamide and doxorubicin in dose dense fashion x4 followed by weekly paclitaxel x12.  This was followed by adjuvant radiation, which was curtailed a few days because of significant skin issues.  She then received 5 years of anastrozole.   PAST MEDICAL HISTORY: Past Medical History:  Diagnosis Date  . Cancer [facility])    Breast cancer right '06- surgery, chemo, radiation- released after 5 yrs  . Hx of seasonal allergies    OTC med used  . Hypertension   . Hypothyroidism     PAST SURGICAL HISTORY: Past Surgical History:  Procedure Laterality Date  . BREAST SURGERY Right    modified partial mastectomy/ lymph nodes dissection  . COLON SURGERY  2/17  . INCISIONAL HERNIA REPAIR N/A 04/06/2016   Procedure: LAPAROSCOPIC ASSISTED INCISIONAL HERNIA REPAIR WITH MESH;  Surgeon: Leighton Ruff, MD;  Location: [facility];  Service: General;  Laterality: N/A;  . TONSILLECTOMY      FAMILY HISTORY: Family History  Problem Relation Age of Onset  . COPD Father    Cassandra [name]'s father died from lung cancer at age  54 her mother died from lung cancer at age 59.  Both were smokers.  She had 2 brothers no sisters.  One brother died from myocardial infarction.  The other one has had CVA.  Both also were smokers.  There is no history of breast or ovarian cancer in the family.   GYNECOLOGIC HISTORY:  No LMP recorded. Patient is postmenopausal. Menarche: 74 years old [place] P0 LMP: Mid 33s No hysterectomy or salpingo-oophorectomy   SOCIAL HISTORY: (Current as of 11/25/2019) Cassandra [name] worked as a Pharmacist, [employer].  "I planned my life as a long vacation."  She lives alone with 2 cats in approximately 20 acres.  She is not a church attender   ADVANCED DIRECTIVES: Her neighbor Cassandra [name] is her healthcare power of attorney.  Cassandra [name] can be reached at [phone]   HEALTH MAINTENANCE: Social History   Tobacco Use  . Smoking status: Never Smoker  . Smokeless tobacco: Never Used  Substance Use Topics  . Alcohol use: Yes    Comment: rare  . Drug use: No    Colonoscopy: 2015  PAP: "No more"  Bone density:  Mammography: At [facility]  No Known Allergies  Current Outpatient Medications  Medication Sig Dispense Refill  . albuterol (VENTOLIN HFA) 108 (90 Base) MCG/ACT inhaler Inhale 2 puffs into the lungs every 4 (four) hours as needed for wheezing or shortness of breath.     Marland Kitchen apixaban (ELIQUIS) 5 MG TABS tablet Take 5 mg by mouth 2 (two) times daily.    Marland Kitchen atorvastatin (LIPITOR) 80 MG tablet Take 80 mg by mouth every evening.    . benazepril (LOTENSIN) 40 MG tablet Take 40 mg by mouth at bedtime.     . bisoprolol-hydrochlorothiazide (ZIAC) 10-6.25 MG tablet Take 1 tablet by mouth every morning.    Marland Kitchen CALCIUM PO Take 1 tablet by mouth 2 (two) times daily.    . Cholecalciferol (VITAMIN D3) 2000 units capsule Take 2,000 Units by mouth every morning.    . Coenzyme Q10 (CO Q 10 PO) Take 1 tablet by mouth every morning.    Marland Kitchen levothyroxine (SYNTHROID, LEVOTHROID) 100 MCG tablet Take 100 mcg by mouth  daily before breakfast.    . loratadine (CLARITIN) 10 MG tablet Take 10 mg by mouth every morning.    Marland Kitchen Lysine 500 MG CAPS Take 500 mg by mouth every morning.    . Multiple Vitamin (MULTIVITAMIN WITH MINERALS) TABS tablet Take 1 tablet by mouth every morning.    Marland Kitchen NIFEdipine (ADALAT CC) 30 MG 24 hr tablet Take 30 mg by mouth daily after breakfast.    . Omega-3 Fatty Acids (FISH OIL) 1000 MG CAPS Take 1,000 mg by mouth every morning.    . TURMERIC PO Take 1 tablet by mouth daily.     No current facility-administered medications for this visit.    OBJECTIVE: Middle-aged white woman in no acute distress  Vitals:   11/25/19 1321  BP: 133/62  Pulse: (!) 58  Resp: 18  Temp: (!) 97.1 F (36.2 C)  SpO2: 98%     Body mass index is 37.79 kg/m.   Wt Readings from Last 3 Encounters:  11/25/19 193 lb 8 oz (87.8 kg)  07/28/19 191 lb 8 oz (86.9 kg)  06/19/19 196 lb (88.9 kg)      ECOG FS:1 - Symptomatic but completely ambulatory  Sclerae unicteric, EOMs intact Wearing a mask No cervical or supraclavicular adenopathy Lungs no rales or rhonchi Heart regular rate and rhythm Abd soft, nontender, positive bowel sounds MSK no focal spinal tenderness, no upper extremity lymphedema Neuro: nonfocal, well oriented, appropriate affect Breasts: The right breast is status post remote lumpectomy and radiation.  There is no evidence of local recurrence.  The left breast is benign.  Both axillae are benign.   [name] RESULTS:  CMP     Component Value Date/Time   NA 144 11/25/2019 1309   K 4.6 11/25/2019 1309   CL 106 11/25/2019 1309   CO2 28 11/25/2019 1309   GLUCOSE 96 11/25/2019 1309   BUN 15 11/25/2019 1309   CREATININE 0.91 11/25/2019 1309   CALCIUM 10.8 (H) 11/25/2019 1309   PROT 7.6 11/25/2019 1309   ALBUMIN 4.4 11/25/2019 1309   AST 22 11/25/2019 1309   ALT 18 11/25/2019 1309   ALKPHOS 76 11/25/2019 1309   BILITOT 0.5 11/25/2019 1309   GFRNONAA >60 11/25/2019 1309   GFRAA >60  11/25/2019 1309    No results found for: TOTALPROTELP, ALBUMINELP, A1GS, A2GS, BETS, BETA2SER, GAMS, MSPIKE, SPEI  No results found for: KPAFRELGTCHN, LAMBDASER, KAPLAMBRATIO  [name] Results  Component Value Date  WBC 9.6 11/25/2019   NEUTROABS 3.7 11/25/2019   HGB 13.6 11/25/2019   HCT 41.5 11/25/2019   MCV 96.5 11/25/2019   PLT 366 11/25/2019   [name] Results  Component Value Date   LABCA2 17 01/09/2010   No components found for: KMQKMM381  No results for input(s): INR in the last 168 hours.  No results found for: CAN199  [name] Results  Component Value Date   CAN125 8.8 05/07/2019   No results found for: RRN165  [name] Results  Component Value Date   CA2729 14.6 05/07/2019   No components found for: HGQUANT  [name] Results  Component Value Date   CEA1 1.41 05/07/2019   /  CEA ([facility])  Date Value Ref Range Status  05/07/2019 1.41 0.00 - 5.00 ng/mL Final    Comment:    (NOTE) This test was performed using Architect's Chemiluminescent Microparticle Immunoassay. Values obtained from different assay methods cannot be used interchangeably. Please note that 5-10% of patients who smoke may see CEA levels up to 6.9 ng/mL. Performed at [facility], [place] [address], [place], [place] [address]      No results found for: AFPTUMOR  No results found for: CHROMOGRNA  No results found for: HGBA, HGBA2QUANT, HGBFQUANT, HGBSQUAN (Hemoglobinopathy evaluation)   No results found for: LDH  No results found for: IRON, TIBC, IRONPCTSAT (Iron and TIBC)  No results found for: FERRITIN  Urinalysis    Component Value Date/Time   COLORURINE YELLOW 04/07/2019 0400   APPEARANCEUR CLEAR 04/07/2019 0400   LABSPEC >1.046 (H) 04/07/2019 0400   PHURINE 5.0 04/07/2019 0400   GLUCOSEU NEGATIVE 04/07/2019 0400   HGBUR NEGATIVE 04/07/2019 0400   BILIRUBINUR NEGATIVE 04/07/2019 0400   KETONESUR NEGATIVE 04/07/2019 0400   PROTEINUR NEGATIVE 04/07/2019 0400    NITRITE NEGATIVE 04/07/2019 0400   LEUKOCYTESUR NEGATIVE 04/07/2019 0400    STUDIES:  No results found.   ELIGIBLE FOR AVAILABLE RESEARCH PROTOCOL: no   ASSESSMENT: 74 y.o. Cassandra [name], [place] woman   (1) status post right lumpectomy and sentinel lymph node sampling 01/20/2004 for a pT2 pN0, stage IIA invasive ductal carcinoma, grade 3, estrogen and progesterone receptor positive, HER-2 not amplified  (a) a total of 4 right axillary lymph nodes were removed  (b) status post chemotherapy  (c) status post tamoxifen  (d) status post adjuvant radiation  (2) bilateral submassive pulmonary embolus 04/06/2019 with right lower extremity DVT documented 04/08/2019,   (a) no obvious provoking incident  (b) screening tumor markers (CEA, CA-125, CA 27-29) negative  (c) risk factors include obesity and relatively sedentary lifestyle  (3) treated initially with intravenous heparin starting 04/06/2019  (a) transitioned to apixaban 5 mg twice daily on 04/08/2019  (b) lifelong anticoagulation recommended   PLAN: Ahriyah is just about 16 years out from definitive surgery for her breast cancer with no evidence of disease recurrence.  This is very favorable.  She continues on apixaban at 5 mg twice daily and the plan is to continue this drug lifelong assuming no bleeding complications and no worsening of kidney function.  She is interested in lowering the dose if possible to minimize bleeding risk.  I do not think that is impossible but she would need to lose about 50 pounds and become much more active than she is right now.  She is taking those as motivators and we will see how far she got by the time she sees me again a year from now  Total encounter time 25 minutes.*  [name]. Mackinley Cassaday, MD 11/25/19 2:01 PM Medical Oncology and Hematology [facility] [place], [place] [address] Tel. [phone]    Fax. [phone]    I, Wilburn Mylar, am acting as scribe for  Dr. [name]. Taaj Hurlbut.  I, Lurline Del MD, have reviewed the above documentation for accuracy and completeness, and I agree with the above.    *Total Encounter Time as defined by the Centers for Medicare and Medicaid Services includes, in addition to the face-to-face time of a patient visit (documented in the note above) non-face-to-face time: obtaining and reviewing outside history, ordering and reviewing medications, tests or procedures, care coordination (communications with other health care professionals or caregivers) and documentation in the medical record.

## 2019-11-25 ENCOUNTER — Inpatient Hospital Stay (HOSPITAL_BASED_OUTPATIENT_CLINIC_OR_DEPARTMENT_OTHER): Payer: Medicare HMO | Admitting: Oncology

## 2019-11-25 ENCOUNTER — Other Ambulatory Visit: Payer: Self-pay

## 2019-11-25 ENCOUNTER — Inpatient Hospital Stay: Payer: Medicare HMO | Attending: Oncology

## 2019-11-25 VITALS — BP 133/62 | HR 58 | Temp 97.1°F | Resp 18 | Ht 60.0 in | Wt 193.5 lb

## 2019-11-25 DIAGNOSIS — Z86718 Personal history of other venous thrombosis and embolism: Secondary | ICD-10-CM | POA: Diagnosis not present

## 2019-11-25 DIAGNOSIS — Z170 Estrogen receptor positive status [ER+]: Secondary | ICD-10-CM

## 2019-11-25 DIAGNOSIS — D689 Coagulation defect, unspecified: Secondary | ICD-10-CM | POA: Insufficient documentation

## 2019-11-25 DIAGNOSIS — I289 Disease of pulmonary vessels, unspecified: Secondary | ICD-10-CM

## 2019-11-25 DIAGNOSIS — Z86711 Personal history of pulmonary embolism: Secondary | ICD-10-CM | POA: Insufficient documentation

## 2019-11-25 DIAGNOSIS — I10 Essential (primary) hypertension: Secondary | ICD-10-CM

## 2019-11-25 DIAGNOSIS — C50811 Malignant neoplasm of overlapping sites of right female breast: Secondary | ICD-10-CM

## 2019-11-25 DIAGNOSIS — E039 Hypothyroidism, unspecified: Secondary | ICD-10-CM

## 2019-11-25 DIAGNOSIS — Z853 Personal history of malignant neoplasm of breast: Secondary | ICD-10-CM | POA: Diagnosis not present

## 2019-11-25 DIAGNOSIS — Z7901 Long term (current) use of anticoagulants: Secondary | ICD-10-CM | POA: Diagnosis not present

## 2019-11-25 DIAGNOSIS — I2602 Saddle embolus of pulmonary artery with acute cor pulmonale: Secondary | ICD-10-CM

## 2019-11-25 DIAGNOSIS — G4733 Obstructive sleep apnea (adult) (pediatric): Secondary | ICD-10-CM

## 2019-11-25 LAB — CBC WITH DIFFERENTIAL/PLATELET
Abs Immature Granulocytes: 0.01 10*3/uL (ref 0.00–0.07)
Basophils Absolute: 0.1 10*3/uL (ref 0.0–0.1)
Basophils Relative: 1 %
Eosinophils Absolute: 0.1 10*3/uL (ref 0.0–0.5)
Eosinophils Relative: 2 %
HCT: 41.5 % (ref 36.0–46.0)
Hemoglobin: 13.6 g/dL (ref 12.0–15.0)
Immature Granulocytes: 0 %
Lymphocytes Relative: 51 %
Lymphs Abs: 5 10*3/uL — ABNORMAL HIGH (ref 0.7–4.0)
MCH: 31.6 pg (ref 26.0–34.0)
MCHC: 32.8 g/dL (ref 30.0–36.0)
MCV: 96.5 fL (ref 80.0–100.0)
Monocytes Absolute: 0.7 10*3/uL (ref 0.1–1.0)
Monocytes Relative: 8 %
Neutro Abs: 3.7 10*3/uL (ref 1.7–7.7)
Neutrophils Relative %: 38 %
Platelets: 366 10*3/uL (ref 150–400)
RBC: 4.3 MIL/uL (ref 3.87–5.11)
RDW: 15.1 % (ref 11.5–15.5)
WBC: 9.6 10*3/uL (ref 4.0–10.5)
nRBC: 0 % (ref 0.0–0.2)

## 2019-11-25 LAB — COMPREHENSIVE METABOLIC PANEL
ALT: 18 U/L (ref 0–44)
AST: 22 U/L (ref 15–41)
Albumin: 4.4 g/dL (ref 3.5–5.0)
Alkaline Phosphatase: 76 U/L (ref 38–126)
Anion gap: 10 (ref 5–15)
BUN: 15 mg/dL (ref 8–23)
CO2: 28 mmol/L (ref 22–32)
Calcium: 10.8 mg/dL — ABNORMAL HIGH (ref 8.9–10.3)
Chloride: 106 mmol/L (ref 98–111)
Creatinine, Ser: 0.91 mg/dL (ref 0.44–1.00)
GFR calc Af Amer: >60 mL/min (ref >60)
GFR calc non Af Amer: >60 mL/min (ref >60)
Glucose, Bld: 96 mg/dL (ref 70–99)
Potassium: 4.6 mmol/L (ref 3.5–5.1)
Sodium: 144 mmol/L (ref 135–145)
Total Bilirubin: 0.5 mg/dL (ref 0.3–1.2)
Total Protein: 7.6 g/dL (ref 6.5–8.1)

## 2019-11-26 ENCOUNTER — Ambulatory Visit: Payer: Medicare HMO | Attending: Internal Medicine

## 2019-11-26 ENCOUNTER — Telehealth: Payer: Self-pay | Admitting: Oncology

## 2019-11-26 DIAGNOSIS — Z23 Encounter for immunization: Secondary | ICD-10-CM | POA: Insufficient documentation

## 2019-11-26 NOTE — Progress Notes (Signed)
   Covid-19 Vaccination Clinic  Name:  Cassandra [name]    MRN: [id number] DOB: [birth year]  11/26/2019  Ms. Lorino was observed post Covid-19 immunization for 15 minutes without incident. She was provided with Vaccine Information Sheet and instruction to access the V-Safe system.   Ms. Haw was instructed to call 911 with any severe reactions post vaccine: Marland Kitchen Difficulty breathing  . Swelling of face and throat  . A fast heartbeat  . A bad rash all over body  . Dizziness and weakness   Immunizations Administered    Name Date Dose VIS Date Route   Pfizer COVID-19 Vaccine 11/26/2019 11:15 AM 0.3 mL 09/04/2019 Intramuscular   Manufacturer: [place]   Lot: [id number]   [place]: [id number]

## 2019-11-26 NOTE — Telephone Encounter (Signed)
I left a message regarding schedule  

## 2019-12-12 DIAGNOSIS — I2699 Other pulmonary embolism without acute cor pulmonale: Secondary | ICD-10-CM | POA: Diagnosis not present

## 2019-12-21 DIAGNOSIS — G4733 Obstructive sleep apnea (adult) (pediatric): Secondary | ICD-10-CM | POA: Diagnosis not present

## 2019-12-22 ENCOUNTER — Ambulatory Visit: Payer: Medicare HMO | Attending: Internal Medicine

## 2019-12-22 DIAGNOSIS — E039 Hypothyroidism, unspecified: Secondary | ICD-10-CM | POA: Diagnosis not present

## 2019-12-22 DIAGNOSIS — M8580 Other specified disorders of bone density and structure, unspecified site: Secondary | ICD-10-CM | POA: Diagnosis not present

## 2019-12-22 DIAGNOSIS — I10 Essential (primary) hypertension: Secondary | ICD-10-CM | POA: Diagnosis not present

## 2019-12-22 DIAGNOSIS — E785 Hyperlipidemia, unspecified: Secondary | ICD-10-CM | POA: Diagnosis not present

## 2019-12-22 DIAGNOSIS — Z23 Encounter for immunization: Secondary | ICD-10-CM

## 2019-12-22 NOTE — Progress Notes (Signed)
   Covid-19 Vaccination Clinic  Name:  Cassandra [name]    MRN: [id number] DOB: Aug 19, 1946  12/22/2019  [name] was observed post Covid-19 immunization for 15 minutes without incident. She was provided with Vaccine Information Sheet and instruction to access the V-Safe system.   [name] was instructed to call 911 with any severe reactions post vaccine: Marland Kitchen Difficulty breathing  . Swelling of face and throat  . A fast heartbeat  . A bad rash all over body  . Dizziness and weakness   Immunizations Administered    Name Date Dose VIS Date Route   Pfizer COVID-19 Vaccine 12/22/2019  2:35 PM 0.3 mL 09/04/2019 Intramuscular   Manufacturer: [place]   Lot: [id number]   [place]: [id number]

## 2019-12-31 DIAGNOSIS — G4733 Obstructive sleep apnea (adult) (pediatric): Secondary | ICD-10-CM | POA: Diagnosis not present

## 2020-01-12 DIAGNOSIS — I2699 Other pulmonary embolism without acute cor pulmonale: Secondary | ICD-10-CM | POA: Diagnosis not present

## 2020-01-20 DIAGNOSIS — M1712 Unilateral primary osteoarthritis, left knee: Secondary | ICD-10-CM | POA: Diagnosis not present

## 2020-01-20 DIAGNOSIS — M170 Bilateral primary osteoarthritis of knee: Secondary | ICD-10-CM | POA: Diagnosis not present

## 2020-01-20 DIAGNOSIS — M1711 Unilateral primary osteoarthritis, right knee: Secondary | ICD-10-CM | POA: Diagnosis not present

## 2020-01-21 DIAGNOSIS — G4733 Obstructive sleep apnea (adult) (pediatric): Secondary | ICD-10-CM | POA: Diagnosis not present

## 2020-01-27 DIAGNOSIS — M170 Bilateral primary osteoarthritis of knee: Secondary | ICD-10-CM | POA: Diagnosis not present

## 2020-02-02 DIAGNOSIS — M170 Bilateral primary osteoarthritis of knee: Secondary | ICD-10-CM | POA: Diagnosis not present

## 2020-02-11 DIAGNOSIS — I2699 Other pulmonary embolism without acute cor pulmonale: Secondary | ICD-10-CM | POA: Diagnosis not present

## 2020-02-19 DIAGNOSIS — M79671 Pain in right foot: Secondary | ICD-10-CM | POA: Diagnosis not present

## 2020-02-19 DIAGNOSIS — M722 Plantar fascial fibromatosis: Secondary | ICD-10-CM | POA: Diagnosis not present

## 2020-02-20 DIAGNOSIS — G4733 Obstructive sleep apnea (adult) (pediatric): Secondary | ICD-10-CM | POA: Diagnosis not present

## 2020-02-25 ENCOUNTER — Telehealth: Payer: Self-pay | Admitting: *Deleted

## 2020-02-25 ENCOUNTER — Encounter: Payer: Self-pay | Admitting: Oncology

## 2020-02-25 NOTE — Telephone Encounter (Signed)
This RN spoke with pt per her VM stating need for assistance with obtaining Eliquis due to she is now in her doughnut hole and cannot afford the medication.  Per our F/A dept - pt can apply for assistance per [email or web address] - this RN gave patient this information but also verified prescribing MD which is [employer]. This RN recommended she contact her office to inquire as well as form needs to be completed by prescribing MD.  Cassandra [name] stated understanding and appreciation as well as stating she called this office because we helped her when she was under treatment for her breast cancer.  This RN verified that at that time Dr Jana Hakim was prescribing MD for needed medications.  No further needs at this time.

## 2020-02-25 NOTE — Progress Notes (Signed)
Received forwarded message from RN from patient with financial concerns regarding Eliquis.  Printed application for Patient Assistance for [employer] for patient and provider to complete their portion and submit documentation. Advised Val RN application can be mailed or faxed directly to [employer].

## 2020-03-02 DIAGNOSIS — M170 Bilateral primary osteoarthritis of knee: Secondary | ICD-10-CM | POA: Diagnosis not present

## 2020-03-02 DIAGNOSIS — M8580 Other specified disorders of bone density and structure, unspecified site: Secondary | ICD-10-CM | POA: Diagnosis not present

## 2020-03-02 DIAGNOSIS — E785 Hyperlipidemia, unspecified: Secondary | ICD-10-CM | POA: Diagnosis not present

## 2020-03-02 DIAGNOSIS — E039 Hypothyroidism, unspecified: Secondary | ICD-10-CM | POA: Diagnosis not present

## 2020-03-02 DIAGNOSIS — I10 Essential (primary) hypertension: Secondary | ICD-10-CM | POA: Diagnosis not present

## 2020-03-09 DIAGNOSIS — G4733 Obstructive sleep apnea (adult) (pediatric): Secondary | ICD-10-CM | POA: Diagnosis not present

## 2020-03-09 DIAGNOSIS — Z86711 Personal history of pulmonary embolism: Secondary | ICD-10-CM | POA: Diagnosis not present

## 2020-03-09 DIAGNOSIS — Z0000 Encounter for general adult medical examination without abnormal findings: Secondary | ICD-10-CM | POA: Diagnosis not present

## 2020-03-09 DIAGNOSIS — E785 Hyperlipidemia, unspecified: Secondary | ICD-10-CM | POA: Diagnosis not present

## 2020-03-09 DIAGNOSIS — G4734 Idiopathic sleep related nonobstructive alveolar hypoventilation: Secondary | ICD-10-CM | POA: Diagnosis not present

## 2020-03-09 DIAGNOSIS — Z1159 Encounter for screening for other viral diseases: Secondary | ICD-10-CM | POA: Diagnosis not present

## 2020-03-09 DIAGNOSIS — I10 Essential (primary) hypertension: Secondary | ICD-10-CM | POA: Diagnosis not present

## 2020-03-09 DIAGNOSIS — E039 Hypothyroidism, unspecified: Secondary | ICD-10-CM | POA: Diagnosis not present

## 2020-03-13 DIAGNOSIS — I2699 Other pulmonary embolism without acute cor pulmonale: Secondary | ICD-10-CM | POA: Diagnosis not present

## 2020-03-22 DIAGNOSIS — G4733 Obstructive sleep apnea (adult) (pediatric): Secondary | ICD-10-CM | POA: Diagnosis not present

## 2020-04-12 DIAGNOSIS — I2699 Other pulmonary embolism without acute cor pulmonale: Secondary | ICD-10-CM | POA: Diagnosis not present

## 2020-04-21 DIAGNOSIS — G4733 Obstructive sleep apnea (adult) (pediatric): Secondary | ICD-10-CM | POA: Diagnosis not present

## 2020-05-13 DIAGNOSIS — I2699 Other pulmonary embolism without acute cor pulmonale: Secondary | ICD-10-CM | POA: Diagnosis not present

## 2020-05-22 DIAGNOSIS — G4733 Obstructive sleep apnea (adult) (pediatric): Secondary | ICD-10-CM | POA: Diagnosis not present

## 2020-05-24 DIAGNOSIS — I10 Essential (primary) hypertension: Secondary | ICD-10-CM | POA: Diagnosis not present

## 2020-05-24 DIAGNOSIS — E785 Hyperlipidemia, unspecified: Secondary | ICD-10-CM | POA: Diagnosis not present

## 2020-05-24 DIAGNOSIS — M170 Bilateral primary osteoarthritis of knee: Secondary | ICD-10-CM | POA: Diagnosis not present

## 2020-05-24 DIAGNOSIS — E039 Hypothyroidism, unspecified: Secondary | ICD-10-CM | POA: Diagnosis not present

## 2020-05-24 DIAGNOSIS — M8580 Other specified disorders of bone density and structure, unspecified site: Secondary | ICD-10-CM | POA: Diagnosis not present

## 2020-06-01 DIAGNOSIS — Z1231 Encounter for screening mammogram for malignant neoplasm of breast: Secondary | ICD-10-CM | POA: Diagnosis not present

## 2020-06-01 DIAGNOSIS — M85852 Other specified disorders of bone density and structure, left thigh: Secondary | ICD-10-CM | POA: Diagnosis not present

## 2020-06-01 DIAGNOSIS — M85851 Other specified disorders of bone density and structure, right thigh: Secondary | ICD-10-CM | POA: Diagnosis not present

## 2020-06-13 DIAGNOSIS — I2699 Other pulmonary embolism without acute cor pulmonale: Secondary | ICD-10-CM | POA: Diagnosis not present

## 2020-06-22 DIAGNOSIS — G4733 Obstructive sleep apnea (adult) (pediatric): Secondary | ICD-10-CM | POA: Diagnosis not present

## 2020-06-22 DIAGNOSIS — I10 Essential (primary) hypertension: Secondary | ICD-10-CM | POA: Diagnosis not present

## 2020-06-22 DIAGNOSIS — M8580 Other specified disorders of bone density and structure, unspecified site: Secondary | ICD-10-CM | POA: Diagnosis not present

## 2020-06-22 DIAGNOSIS — E785 Hyperlipidemia, unspecified: Secondary | ICD-10-CM | POA: Diagnosis not present

## 2020-06-22 DIAGNOSIS — E039 Hypothyroidism, unspecified: Secondary | ICD-10-CM | POA: Diagnosis not present

## 2020-06-22 DIAGNOSIS — M170 Bilateral primary osteoarthritis of knee: Secondary | ICD-10-CM | POA: Diagnosis not present

## 2020-07-13 DIAGNOSIS — I2699 Other pulmonary embolism without acute cor pulmonale: Secondary | ICD-10-CM | POA: Diagnosis not present

## 2020-07-13 DIAGNOSIS — M8580 Other specified disorders of bone density and structure, unspecified site: Secondary | ICD-10-CM | POA: Diagnosis not present

## 2020-07-13 DIAGNOSIS — E039 Hypothyroidism, unspecified: Secondary | ICD-10-CM | POA: Diagnosis not present

## 2020-07-13 DIAGNOSIS — I10 Essential (primary) hypertension: Secondary | ICD-10-CM | POA: Diagnosis not present

## 2020-07-13 DIAGNOSIS — M170 Bilateral primary osteoarthritis of knee: Secondary | ICD-10-CM | POA: Diagnosis not present

## 2020-07-13 DIAGNOSIS — E785 Hyperlipidemia, unspecified: Secondary | ICD-10-CM | POA: Diagnosis not present

## 2020-08-10 DIAGNOSIS — G4733 Obstructive sleep apnea (adult) (pediatric): Secondary | ICD-10-CM | POA: Diagnosis not present

## 2020-08-13 DIAGNOSIS — I2699 Other pulmonary embolism without acute cor pulmonale: Secondary | ICD-10-CM | POA: Diagnosis not present

## 2020-08-16 DIAGNOSIS — E785 Hyperlipidemia, unspecified: Secondary | ICD-10-CM | POA: Diagnosis not present

## 2020-08-16 DIAGNOSIS — M8580 Other specified disorders of bone density and structure, unspecified site: Secondary | ICD-10-CM | POA: Diagnosis not present

## 2020-08-16 DIAGNOSIS — E039 Hypothyroidism, unspecified: Secondary | ICD-10-CM | POA: Diagnosis not present

## 2020-08-16 DIAGNOSIS — I10 Essential (primary) hypertension: Secondary | ICD-10-CM | POA: Diagnosis not present

## 2020-08-16 DIAGNOSIS — G4700 Insomnia, unspecified: Secondary | ICD-10-CM | POA: Diagnosis not present

## 2020-09-12 DIAGNOSIS — I2699 Other pulmonary embolism without acute cor pulmonale: Secondary | ICD-10-CM | POA: Diagnosis not present

## 2020-10-05 ENCOUNTER — Ambulatory Visit (INDEPENDENT_AMBULATORY_CARE_PROVIDER_SITE_OTHER): Payer: Medicare HMO | Admitting: Emergency Medicine

## 2020-10-05 ENCOUNTER — Other Ambulatory Visit: Payer: Self-pay

## 2020-10-05 ENCOUNTER — Encounter: Payer: Self-pay | Admitting: Emergency Medicine

## 2020-10-05 DIAGNOSIS — I2690 Septic pulmonary embolism without acute cor pulmonale: Secondary | ICD-10-CM

## 2020-10-05 DIAGNOSIS — G4733 Obstructive sleep apnea (adult) (pediatric): Secondary | ICD-10-CM

## 2020-10-05 DIAGNOSIS — I2782 Chronic pulmonary embolism: Secondary | ICD-10-CM

## 2020-10-05 NOTE — Assessment & Plan Note (Addendum)
History of pulmonary embolism and multifactorial secondary PAH which resolved after her PE was treated.  Decision was made for her to remain on Eliquis lifelong and she is tolerating well.  Plan to continue it indefinitely as long as is not a contraindication.  She will be able to come off of Eliquis as needed for her upcoming knee surgery.  She will need to be back on anticoagulation as soon as feasible after her procedure.  I would deem her to be low to moderate risk for her knee procedure based on how well compensated both her thrombovascular disease and her OSA have been.

## 2020-10-05 NOTE — Patient Instructions (Addendum)
Please continue your CPAP and oxygen every night as you have been using it. Continue your Eliquis 5 mg twice a day.  It will be okay for you to stop it temporarily as recommended by Dr. Wynelle Link preparation for your knee surgery.  You will need to get back on it as soon as Dr. Wynelle Link says it is okay. Continue your loratadine 10 mg once daily. Follow with Dr. Lamonte Sakai in 12 months or sooner if you have any problems.

## 2020-10-05 NOTE — Assessment & Plan Note (Signed)
She has great compliance, wears it every night for greater than 4 hours.  Good clinical benefit.  Less sleepiness, more energy during the day.  The device is in good repair, she gets replacement equipment reliably.  Plan to continue same

## 2020-10-05 NOTE — Progress Notes (Signed)
Subjective:    Patient ID: Cassandra [name], female    DOB: Feb 13, 1946, 75 y.o.   MRN: [id number]  HPI 75 year old never smoker with a history of breast cancer (2006), hypertension, hypothyroidism, seasonal allergies.  She was admitted with acute pulmonary embolism 04/06/2019.  Her CT chest showed evidence for right heart strain which was confirmed on echocardiogram done 04/07/2019 -showed a moderately dilated RV with moderate systolic dysfunction and severe PAH, RVSP 78 mmHg.  She presents today for follow-up.  She is been treated with Eliquis, remains on this.  Her clot was apparently unprovoked and plan at least for now is to stay on anticoagulation for life. She had a PSG at [facility] last week that apparently showed an AHI of 19/h. She is planning to start CPAP.   She feels better - much less short of breath, less fatigue. She does note that her activity is limited due to knee OA. No complications from eliquis  A repeat echocardiogram done 06/18/2019 reviewed by me.  This shows normal right atrium, normal RV size, wall thickness and systolic function.  There is now trivial TR and no evidence for PAH.  ROV 10/05/20 --Cassandra [name] is a 75, follows up today for her history of pulmonary embolism (03/2019), obstructive sleep apnea and associated pulmonary hypertension.  She also has a past medical history of hypertension, breast cancer, hypothyroidism, allergies.  Her most recent echocardiogram showed resolution of her secondary PAH that was identified after her PE.  She is on Eliquis and our plan has been for her to remain on lifelong unless there is a contraindication.  She remains on CPAP +2.5 L/min O2 bled in based on her last ONO's.  Confirms great compliance and good clinical benefit.  Reports that she has limited mobility due to knee OA and pain. Breathing is doing well. No bleeding, tolerating Eliquis. Her CPAP is in good repair, she sleeps well, does get up to urinate. She feels well rested in the  am. She is planning for knee surgery with Dr Maureen Ralphs soon - is doing well, no hesitation from [facility] perspective. She is on loratadine for allergic rhinitis, does well w this. She appears to be tolerating her ACE-I.    Review of Systems  Constitutional: Negative for fever and unexpected weight change.  HENT: Negative for congestion, dental problem, ear pain, nosebleeds, postnasal drip, rhinorrhea, sinus pressure, sneezing, sore throat and trouble swallowing.   Eyes: Negative for redness and itching.  Respiratory: Negative for cough, chest tightness, shortness of breath and wheezing.   Cardiovascular: Negative for palpitations and leg swelling.  Gastrointestinal: Negative for nausea and vomiting.  Genitourinary: Negative for dysuria.  Musculoskeletal: Negative for joint swelling.  Skin: Negative for rash.  Neurological: Negative for headaches.  Hematological: Does not bruise/bleed easily.  Psychiatric/Behavioral: Negative for dysphoric mood. The patient is not nervous/anxious.      Past Medical History:  Diagnosis Date  . Cancer [facility])    Breast cancer right '06- surgery, chemo, radiation- released after 5 yrs  . Hx of seasonal allergies    OTC med used  . Hypertension   . Hypothyroidism      Family History  Problem Relation Age of Onset  . COPD Father      Social History   Socioeconomic History  . Marital status: Single    Spouse name: Not on file  . Number of children: Not on file  . Years of education: Not on file  . Highest education level: Not on  file  Occupational History  . Not on file  Tobacco Use  . Smoking status: Never Smoker  . Smokeless tobacco: Never Used  Substance and Sexual Activity  . Alcohol use: Yes    Comment: rare  . Drug use: No  . Sexual activity: Not on file  Other Topics Concern  . Not on file  Social History Narrative  . Not on file   Social Determinants of Health   Financial Resource Strain: Not on file  Food Insecurity: Not on file   Transportation Needs: Not on file  Physical Activity: Not on file  Stress: Not on file  Social Connections: Not on file  Intimate Partner Violence: Not on file  Has worked in education.  No inhaled exposures.   No Known Allergies   Outpatient Medications Prior to Visit  Medication Sig Dispense Refill  . albuterol (VENTOLIN HFA) 108 (90 Base) MCG/ACT inhaler Inhale 2 puffs into the lungs every 4 (four) hours as needed for wheezing or shortness of breath.     Marland Kitchen apixaban (ELIQUIS) 5 MG TABS tablet Take 5 mg by mouth 2 (two) times daily.    Marland Kitchen atorvastatin (LIPITOR) 80 MG tablet Take 80 mg by mouth every evening.    . benazepril (LOTENSIN) 40 MG tablet Take 40 mg by mouth at bedtime.     . bisoprolol-hydrochlorothiazide (ZIAC) 10-6.25 MG tablet Take 1 tablet by mouth every morning.    Marland Kitchen CALCIUM PO Take 1 tablet by mouth 2 (two) times daily.    . Cholecalciferol (VITAMIN D3) 2000 units capsule Take 2,000 Units by mouth every morning.    . Coenzyme Q10 (CO Q 10 PO) Take 1 tablet by mouth every morning.    Marland Kitchen levothyroxine (SYNTHROID, LEVOTHROID) 100 MCG tablet Take 100 mcg by mouth daily before breakfast.    . loratadine (CLARITIN) 10 MG tablet Take 10 mg by mouth every morning.    Marland Kitchen Lysine 500 MG CAPS Take 500 mg by mouth every morning.    . Multiple Vitamin (MULTIVITAMIN WITH MINERALS) TABS tablet Take 1 tablet by mouth every morning.    Marland Kitchen NIFEdipine (ADALAT CC) 30 MG 24 hr tablet Take 30 mg by mouth daily after breakfast.    . Omega-3 Fatty Acids (FISH OIL) 1000 MG CAPS Take 1,000 mg by mouth every morning.    . TURMERIC PO Take 1 tablet by mouth daily.     No facility-administered medications prior to visit.        Objective:   Physical Exam Vitals:   10/05/20 1628  BP: 122/70  Pulse: 67  Temp: 98.6 F (37 C)  SpO2: 97%  Weight: 201 lb 3.2 oz (91.3 kg)  Height: 5' 0.5" (1.537 m)   Gen: Pleasant, overwt woman, in no distress,  normal affect  ENT: No lesions,  mouth clear,   oropharynx clear, no postnasal drip  Neck: No JVD, no stridor  Lungs: No use of accessory muscles, no crackles or wheezing on normal respiration, no wheeze on forced expiration  Cardiovascular: RRR, heart sounds normal, no murmur or gallops, trace LE peripheral edema especially feet  Musculoskeletal: No deformities, no cyanosis or clubbing  Neuro: alert, awake, non focal  Skin: Warm, no lesions or rash     Assessment & Plan:  Obstructive sleep apnea She has great compliance, wears it every night for greater than 4 hours.  Good clinical benefit.  Less sleepiness, more energy during the day.  The device is in good repair, she gets replacement  equipment reliably.  Plan to continue same  Pulmonary embolism ([place]) History of pulmonary embolism and multifactorial secondary PAH which resolved after her PE was treated.  Decision was made for her to remain on Eliquis lifelong and she is tolerating well.  Plan to continue it indefinitely as long as is not a contraindication.  She will be able to come off of Eliquis as needed for her upcoming knee surgery.  She will need to be back on anticoagulation as soon as feasible after her procedure.  I would deem her to be low to moderate risk for her knee procedure based on how well compensated both her thrombovascular disease and her OSA have been.  Baltazar Apo, MD, PhD 10/05/2020, 4:57 PM [facility] [phone] or if no answer [phone]

## 2020-10-13 DIAGNOSIS — M170 Bilateral primary osteoarthritis of knee: Secondary | ICD-10-CM | POA: Diagnosis not present

## 2020-10-13 DIAGNOSIS — I2699 Other pulmonary embolism without acute cor pulmonale: Secondary | ICD-10-CM | POA: Diagnosis not present

## 2020-10-25 DIAGNOSIS — Z01818 Encounter for other preprocedural examination: Secondary | ICD-10-CM | POA: Diagnosis not present

## 2020-10-25 DIAGNOSIS — G4733 Obstructive sleep apnea (adult) (pediatric): Secondary | ICD-10-CM | POA: Diagnosis not present

## 2020-10-25 DIAGNOSIS — M170 Bilateral primary osteoarthritis of knee: Secondary | ICD-10-CM | POA: Diagnosis not present

## 2020-10-25 DIAGNOSIS — I10 Essential (primary) hypertension: Secondary | ICD-10-CM | POA: Diagnosis not present

## 2020-10-25 DIAGNOSIS — Z7901 Long term (current) use of anticoagulants: Secondary | ICD-10-CM | POA: Diagnosis not present

## 2020-10-25 DIAGNOSIS — Z86711 Personal history of pulmonary embolism: Secondary | ICD-10-CM | POA: Diagnosis not present

## 2020-10-25 DIAGNOSIS — R7301 Impaired fasting glucose: Secondary | ICD-10-CM | POA: Diagnosis not present

## 2020-11-13 DIAGNOSIS — I2699 Other pulmonary embolism without acute cor pulmonale: Secondary | ICD-10-CM | POA: Diagnosis not present

## 2020-11-28 ENCOUNTER — Inpatient Hospital Stay: Payer: Medicare HMO

## 2020-11-28 ENCOUNTER — Encounter: Payer: Self-pay | Admitting: Oncology

## 2020-11-28 ENCOUNTER — Inpatient Hospital Stay: Payer: Medicare HMO | Attending: Oncology | Admitting: Oncology

## 2020-11-28 DIAGNOSIS — Z170 Estrogen receptor positive status [ER+]: Secondary | ICD-10-CM

## 2020-11-28 DIAGNOSIS — C50811 Malignant neoplasm of overlapping sites of right female breast: Secondary | ICD-10-CM

## 2020-11-28 NOTE — Progress Notes (Signed)
[place]  [phone] [phone]    ID: Cassandra Cassandra [name] DOB: Apr 04, 1946  MR#: [id number]  TEL#:[id number]  Patient Care Team: Harlan Stains, Cassandra [name] as PCP - General (Family Medicine) Cassandra [name], Cassandra Cassandra [name], Cassandra [name] as Consulting Physician (Hematology and Oncology) Cassandra Cassandra [name], Cassandra [name] as Consulting Physician (Radiation Oncology) Gaynelle Arabian, Cassandra [name] as Consulting Physician (Orthopedic Surgery) Collene Gobble, Cassandra [name] as Consulting Physician (Pulmonary Disease) OTHER Cassandra [name]:   CHIEF COMPLAINT: Coagulopathy; history of breast cancer  CURRENT TREATMENT: apixaban   INTERVAL HISTORY: Cassandra Cassandra [name] was scheduled today for follow up of her coagulopathy and remote history of breast cancer.     Since her last visit, she underwent bilateral screening mammography with tomography at [place]: [place] 19 VACCINATION STATUS: fully vaccinated (Pfier), with booster 07/2020   HISTORY OF CURRENT ILLNESS: Cassandra Cassandra [name] is a 75 y.o. woman I followed remotely for history of breast cancer, detailed below.  She is now referred referred here by Dr. Carol Ada for follow up to a Pulmonary Embolism and use of anticoagulants.   Cassandra Cassandra [name] presented to the ED on 04/06/2019 with a two-week history of shortness of breath and dyspnea on exertion.  There have been no provoking incidents other than binging on an [place] TV mystery series, 3 to 4 hours at a time.  The first day she noted shortness of breath she was on her riding mower, March 25, 2019.  As her shortness of breath did not improve she thought she might have the coronavirus and got herself tested.  It took several days for the results to come back but they were negative.  She thensaw Dr. Tamala Julian for additional work-up and was referred to the emergency room.  There blood pressure was in the normal range although perhaps slightly lower than the patient's average. She underwent a chest xray on 04/06/2019 showing: Heart and  mediastinal contours are within normal limits. No focal opacities or effusions. No acute bony abnormality.  She then underwent a CT chest angiography with contrast on the same day showing: Extensive bilateral pulmonary emboli  involving all lobes of both lungs, right greater than left. RV to LV ratio is 1.8, significantly elevated compatible with right heart strain. Heart is upper limits normal in size. Trace right pleural effusion. Patchy ground-glass opacities in the posterior left upper lobe, anteromedial right middle lobe which could reflect early infarcts.  She was started on intravenous heparin and transitioned to apixaban 04/08/2019  Echocardiogram 04/07/2019 showed an ejection fraction of 60-65%, with moderately reduced right ventricular systolic function.  Bilateral lower extremity Dopplers 04/08/2019 found acute deep vein thrombus in the left popliteal vein.  The patient's subsequent history is as detailed below.   HISTORY OF BREAST CANCER: Cassandra Cassandra [name] has a remote history of breast cancer, status post lumpectomy under [facility] on 01/20/2004. The pathology from this procedure showed ([id number]):  1. Sentinel lymph node biopsy, right axillary node:    - one lymph node, negative for metastatic carcinoma (0 of 1), see comment.   2. Sentinel lymph node biopsy, right axillary node:    - one lymph node, negative for metastatic carcinoma (0 of 1), see comment.   3. Sentinel lymph node biopsy, right axillary node:    - one lymph node, negative for metastatic carcinoma (0 of 1), see comment.   4. Sentinel lymph node biopsy, right axillary node:    - one lymph node, negative for metastatic carcinoma (0 of 1), see comment.  5. Lumpectomy, right breast:    - invasive mammary carcinoma, no special type (ductal), 3.9 cm, msbr grade 3 of 3.    - high grade ductal carcinoma in situ.    - extensive intraductal component negative (jcrt).    - margins negative for tumor (in situ and invasive).    -  no definitive lymphovascular invasion identified.   The tumor was estrogen and progesterone receptor positive, HER-2 nonamplified, with an MIB-1 of 40%.  Adjuvant chemotherapy included cyclophosphamide and doxorubicin in dose dense fashion x4 followed by weekly paclitaxel x12.  This was followed by adjuvant radiation, which was curtailed a few days because of significant skin issues.  She then received 5 years of anastrozole.   PAST MEDICAL HISTORY: Past Medical History:  Diagnosis Date  . Cancer [facility])    Breast cancer right '06- surgery, chemo, radiation- released after 5 yrs  . Hx of seasonal allergies    OTC med used  . Hypertension   . Hypothyroidism     PAST SURGICAL HISTORY: Past Surgical History:  Procedure Laterality Date  . BREAST SURGERY Right    modified partial mastectomy/ lymph nodes dissection  . COLON SURGERY  2/17  . INCISIONAL HERNIA REPAIR N/A 04/06/2016   Procedure: LAPAROSCOPIC ASSISTED INCISIONAL HERNIA REPAIR WITH MESH;  Surgeon: Leighton Ruff, Cassandra [name];  Location: [facility];  Service: General;  Laterality: N/A;  . TONSILLECTOMY      FAMILY HISTORY: Family History  Problem Relation Age of Onset  . COPD Father    Cassandra Cassandra [name]'s father died from lung cancer at age 88 her mother died from lung cancer at age 76.  Both were smokers.  She had 2 brothers no sisters.  One brother died from myocardial infarction.  The other one has had CVA.  Both also were smokers.  There is no history of breast or ovarian cancer in the family.   GYNECOLOGIC HISTORY:  No LMP recorded. Patient is postmenopausal. Menarche: 75 years old [place] P0 LMP: Mid 60s No hysterectomy or salpingo-oophorectomy   SOCIAL HISTORY: (Current as of 11/28/2020) Cassandra Cassandra [name] worked as a Pharmacist, [employer].  "I planned my life as a long vacation."  She lives alone with 2 cats in approximately 20 acres.  She is not a church attender   ADVANCED DIRECTIVES: Her neighbor Cassandra Cassandra [name] is her healthcare power of  attorney.  Cassandra Cassandra [name] can be reached at [phone]   HEALTH MAINTENANCE: Social History   Tobacco Use  . Smoking status: Never Smoker  . Smokeless tobacco: Never Used  Substance Use Topics  . Alcohol use: Yes    Comment: rare  . Drug use: No    Colonoscopy: 2015  PAP: "No more"  Bone density:  Mammography: At [facility]  No Known Allergies  Current Outpatient Medications  Medication Sig Dispense Refill  . albuterol (VENTOLIN HFA) 108 (90 Base) MCG/ACT inhaler Inhale 2 puffs into the lungs every 4 (four) hours as needed for wheezing or shortness of breath.  (Patient not taking: Reported on 11/25/2020)    . apixaban (ELIQUIS) 5 MG TABS tablet Take 5 mg by mouth 2 (two) times daily.    Marland Kitchen atorvastatin (LIPITOR) 80 MG tablet Take 80 mg by mouth every evening.    Marland Kitchen b complex vitamins capsule Take 1 capsule by mouth daily.    . benazepril (LOTENSIN) 40 MG tablet Take 40 mg by mouth at bedtime.     . Beta Carotene (VITAMIN A) 25000 UNIT capsule Take 25,000 Units by  mouth daily.    . bisoprolol-hydrochlorothiazide (ZIAC) 10-6.25 MG tablet Take 1 tablet by mouth every morning.    Marland Kitchen CALCIUM PO Take 1 tablet by mouth 2 (two) times daily.    . Cholecalciferol (VITAMIN D3) 2000 units capsule Take 2,000 Units by mouth every morning.    . Coenzyme Q10 (CO Q 10 PO) Take 1 tablet by mouth every morning. (Patient not taking: Reported on 11/25/2020)    . levothyroxine (SYNTHROID, LEVOTHROID) 100 MCG tablet Take 100 mcg by mouth daily before breakfast.    . loratadine (CLARITIN) 10 MG tablet Take 10 mg by mouth every morning. (Patient not taking: Reported on 11/25/2020)    . Lysine 500 MG CAPS Take 500 mg by mouth every morning.    . Multiple Vitamin (MULTIVITAMIN WITH MINERALS) TABS tablet Take 1 tablet by mouth every morning.    Marland Kitchen NIFEdipine (ADALAT CC) 30 MG 24 hr tablet Take 30 mg by mouth daily after breakfast.    . Omega-3 Fatty Acids (FISH OIL) 1000 MG CAPS Take 1,000 mg by mouth every morning.    .  TURMERIC PO Take 1 tablet by mouth daily. (Patient not taking: Reported on 11/25/2020)    . vitamin E 180 MG (400 UNITS) capsule Take 400 Units by mouth daily.     No current facility-administered medications for this visit.    OBJECTIVE:   There were no vitals filed for this visit.   There is no height or weight on file to calculate BMI.   Wt Readings from Last 3 Encounters:  10/05/20 201 lb 3.2 oz (91.3 kg)  11/25/19 193 lb 8 oz (87.8 kg)  07/28/19 191 lb 8 oz (86.9 kg)      ECOG FS: :      Cassandra [name] RESULTS:  CMP     Component Value Date/Time   NA 144 11/25/2019 1309   K 4.6 11/25/2019 1309   CL 106 11/25/2019 1309   CO2 28 11/25/2019 1309   GLUCOSE 96 11/25/2019 1309   BUN 15 11/25/2019 1309   CREATININE 0.91 11/25/2019 1309   CALCIUM 10.8 (H) 11/25/2019 1309   PROT 7.6 11/25/2019 1309   ALBUMIN 4.4 11/25/2019 1309   AST 22 11/25/2019 1309   ALT 18 11/25/2019 1309   ALKPHOS 76 11/25/2019 1309   BILITOT 0.5 11/25/2019 1309   GFRNONAA >60 11/25/2019 1309   GFRAA >60 11/25/2019 1309    No results found for: TOTALPROTELP, ALBUMINELP, A1GS, A2GS, BETS, BETA2SER, GAMS, MSPIKE, SPEI  No results found for: KPAFRELGTCHN, LAMBDASER, KAPLAMBRATIO  Cassandra [name] Results  Component Value Date   WBC 9.6 11/25/2019   NEUTROABS 3.7 11/25/2019   HGB 13.6 11/25/2019   HCT 41.5 11/25/2019   MCV 96.5 11/25/2019   PLT 366 11/25/2019   Cassandra [name] Results  Component Value Date   LABCA2 17 01/09/2010   No components found for: QJFHLK562  No results for input(s): INR in the last 168 hours.  No results found for: CAN199  Cassandra [name] Results  Component Value Date   CAN125 8.8 05/07/2019   No results found for: BWL893  Cassandra [name] Results  Component Value Date   CA2729 14.6 05/07/2019   No components found for: HGQUANT  Cassandra [name] Results  Component Value Date   CEA1 1.41 05/07/2019   /  CEA ([facility])  Date Value Ref Range Status  05/07/2019 1.41 0.00 - 5.00 ng/mL Final    Comment:     (NOTE) This test was performed using Architect's Chemiluminescent Microparticle Immunoassay. Values  obtained from different assay methods cannot be used interchangeably. Please note that 5-10% of patients who smoke may see CEA levels up to 6.9 ng/mL. Performed at [facility], [place] [address] [place], [place] [address]      No results found for: AFPTUMOR  No results found for: CHROMOGRNA  No results found for: HGBA, HGBA2QUANT, HGBFQUANT, HGBSQUAN (Hemoglobinopathy evaluation)   No results found for: LDH  No results found for: IRON, TIBC, IRONPCTSAT (Iron and TIBC)  No results found for: FERRITIN  Urinalysis    Component Value Date/Time   COLORURINE YELLOW 04/07/2019 0400   APPEARANCEUR CLEAR 04/07/2019 0400   LABSPEC >1.046 (H) 04/07/2019 0400   PHURINE 5.0 04/07/2019 0400   GLUCOSEU NEGATIVE 04/07/2019 0400   HGBUR NEGATIVE 04/07/2019 0400   BILIRUBINUR NEGATIVE 04/07/2019 0400   KETONESUR NEGATIVE 04/07/2019 0400   PROTEINUR NEGATIVE 04/07/2019 0400   NITRITE NEGATIVE 04/07/2019 0400   LEUKOCYTESUR NEGATIVE 04/07/2019 0400    STUDIES:  No results found.   ELIGIBLE FOR AVAILABLE RESEARCH PROTOCOL: no   ASSESSMENT: 75 y.o. , [place] woman   (1) status post right lumpectomy and sentinel lymph node sampling 01/20/2004 for a pT2 pN0, stage IIA invasive ductal carcinoma, grade 3, estrogen and progesterone receptor positive, HER-2 not amplified  (a) a total of 4 right axillary lymph nodes were removed  (b) status post chemotherapy  (c) status post tamoxifen  (d) status post adjuvant radiation  (2) bilateral submassive pulmonary embolus 04/06/2019 with right lower extremity DVT documented 04/08/2019,   (a) no obvious provoking incident  (b) screening tumor markers (CEA, CA-125, CA 27-29) negative  (c) risk factors include obesity and relatively sedentary lifestyle  (3) treated initially with intravenous heparin starting  04/06/2019  (a) transitioned to apixaban 5 mg twice daily on 04/08/2019  (b) lifelong anticoagulation recommended   PLAN: Cassandra Cassandra [name] did not show for her 11/28/2020 visit.  A follow-up letter was sent   Cassandra Jews C. Magrinat, Cassandra [name] 11/28/20 12:09 AM Medical Oncology and Hematology Cassandra Cassandra [name], [place] [address] Tel. [phone]    Fax. [phone]    I, Cassandra Cassandra [name], am acting as scribe for Dr. Virgie [name]. Cassandra [name].  I, Cassandra Cassandra [name], have reviewed the above documentation for accuracy and completeness, and I agree with the above.   *Total Encounter Time as defined by the Centers for Medicare and Medicaid Services includes, in addition to the face-to-face time of a patient visit (documented in the note above) non-face-to-face time: obtaining and reviewing outside history, ordering and reviewing medications, tests or procedures, care coordination (communications with other health care professionals or caregivers) and documentation in the medical record.

## 2020-11-29 ENCOUNTER — Telehealth: Payer: Self-pay | Admitting: Oncology

## 2020-11-29 NOTE — Telephone Encounter (Signed)
Called pt per 3/7 sch msg - no answer / no vmail.

## 2020-12-07 NOTE — Patient Instructions (Addendum)
DUE TO COVID-19 ONLY ONE VISITOR IS ALLOWED TO COME WITH YOU AND STAY IN THE WAITING ROOM ONLY DURING PRE OP AND PROCEDURE DAY OF SURGERY. THE 1 VISITOR  MAY VISIT WITH YOU AFTER SURGERY IN YOUR PRIVATE ROOM DURING VISITING HOURS ONLY!  YOU NEED TO HAVE A COVID 19 TEST ON: 12/15/20 @  2:00 PM , THIS TEST MUST BE DONE BEFORE SURGERY,  COVID TESTING SITE [place] [id number], IT IS ON THE RIGHT GOING OUT [address] APPROXIMATELY  2 MINUTES PAST [employer] ON THE RIGHT. ONCE YOUR COVID TEST IS COMPLETED,  PLEASE BEGIN THE QUARANTINE INSTRUCTIONS AS OUTLINED IN YOUR HANDOUT.                Janne Lab   Your procedure is scheduled on: 12/19/20   Report to [facility] Main  Entrance   Report to admitting at: 11:15 AM     Call this number if you have problems the morning of surgery [phone]    Remember:   NO SOLID FOOD AFTER MIDNIGHT THE NIGHT PRIOR TO SURGERY. NOTHING BY MOUTH EXCEPT CLEAR LIQUIDS UNTIL: 10:45 AM . PLEASE FINISH ENSURE DRINK PER SURGEON ORDER  WHICH NEEDS TO BE COMPLETED AT: 10:45 AM .  CLEAR LIQUID DIET  Foods Allowed                                                                     Foods Excluded  Coffee and tea, regular and decaf                             liquids that you cannot  Plain Jell-O any favor except red or purple                                           see through such as: Fruit ices (not with fruit pulp)                                     milk, soups, orange juice  Iced Popsicles                                    All solid food Carbonated beverages, regular and diet                                    Cranberry, grape and apple juices Sports drinks like Gatorade Lightly seasoned clear broth or consume(fat free) Sugar, honey syrup  Sample Menu Breakfast                                Lunch  Supper Cranberry juice                    Beef broth                             Chicken broth Jell-O                                     Grape juice                           Apple juice Coffee or tea                        Jell-O                                      Popsicle                                                Coffee or tea                        Coffee or tea  _____________________________________________________________________   BRUSH YOUR TEETH MORNING OF SURGERY AND RINSE YOUR MOUTH OUT, NO CHEWING GUM CANDY OR MINTS.    Take these medicines the morning of surgery with A SIP OF WATER: Bisoprolol,levothyroxine,nifedipine(adalat).                               You may not have any metal on your body including hair pins and              piercings  Do not wear jewelry, make-up, lotions, powders or perfumes, deodorant             Do not wear nail polish on your fingernails.  Do not shave  48 hours prior to surgery.    Do not bring valuables to the hospital. [place].  Contacts, dentures or bridgework may not be worn into surgery.  Leave suitcase in the car. After surgery it may be brought to your room.     Patients discharged the day of surgery will not be allowed to drive home. IF YOU ARE HAVING SURGERY AND GOING HOME THE SAME DAY, YOU MUST HAVE AN ADULT TO DRIVE YOU HOME AND BE WITH YOU FOR 24 HOURS. YOU MAY GO HOME BY TAXI OR UBER OR ORTHERWISE, BUT AN ADULT MUST ACCOMPANY YOU HOME AND STAY WITH YOU FOR 24 HOURS.  Name and phone number of your driver:  Special Instructions: N/A              Please read over the following fact sheets you were given: _____________________________________________________________________          [facility] - Preparing for Surgery Before surgery, you can play an important role.  Because skin is not sterile, your skin needs to be as free of germs as possible.  You  can reduce the number of germs on your skin by washing with CHG (chlorahexidine gluconate) soap before  surgery.  CHG is an antiseptic cleaner which kills germs and bonds with the skin to continue killing germs even after washing. Please DO NOT use if you have an allergy to CHG or antibacterial soaps.  If your skin becomes reddened/irritated stop using the CHG and inform your nurse when you arrive at Short Stay. Do not shave (including legs and underarms) for at least 48 hours prior to the first CHG shower.  You may shave your face/neck. Please follow these instructions carefully:  1.  Shower with CHG Soap the night before surgery and the  morning of Surgery.  2.  If you choose to wash your hair, wash your hair first as usual with your  normal  shampoo.  3.  After you shampoo, rinse your hair and body thoroughly to remove the  shampoo.                           4.  Use CHG as you would any other liquid soap.  You can apply chg directly  to the skin and wash                       Gently with a scrungie or clean washcloth.  5.  Apply the CHG Soap to your body ONLY FROM THE NECK DOWN.   Do not use on face/ open                           Wound or open sores. Avoid contact with eyes, ears mouth and genitals (private parts).                       Wash face,  Genitals (private parts) with your normal soap.             6.  Wash thoroughly, paying special attention to the area where your surgery  will be performed.  7.  Thoroughly rinse your body with warm water from the neck down.  8.  DO NOT shower/wash with your normal soap after using and rinsing off  the CHG Soap.                9.  Pat yourself dry with a clean towel.            10.  Wear clean pajamas.            11.  Place clean sheets on your bed the night of your first shower and do not  sleep with pets. Day of Surgery : Do not apply any lotions/deodorants the morning of surgery.  Please wear clean clothes to the hospital/surgery center.  FAILURE TO FOLLOW THESE INSTRUCTIONS MAY RESULT IN THE CANCELLATION OF YOUR SURGERY PATIENT  SIGNATURE_________________________________  NURSE SIGNATURE__________________________________  ________________________________________________________________________   Adam Phenix  An incentive spirometer is a tool that can help keep your lungs clear and active. This tool measures how well you are filling your lungs with each breath. Taking long deep breaths may help reverse or decrease the chance of developing breathing (pulmonary) problems (especially infection) following:  A long period of time when you are unable to move or be active. BEFORE THE PROCEDURE   If the spirometer includes an indicator to show your best effort, your nurse or respiratory therapist will set  it to a desired goal.  If possible, sit up straight or lean slightly forward. Try not to slouch.  Hold the incentive spirometer in an upright position. INSTRUCTIONS FOR USE  1. Sit on the edge of your bed if possible, or sit up as far as you can in bed or on a chair. 2. Hold the incentive spirometer in an upright position. 3. Breathe out normally. 4. Place the mouthpiece in your mouth and seal your lips tightly around it. 5. Breathe in slowly and as deeply as possible, raising the piston or the ball toward the top of the column. 6. Hold your breath for 3-5 seconds or for as long as possible. Allow the piston or ball to fall to the bottom of the column. 7. Remove the mouthpiece from your mouth and breathe out normally. 8. Rest for a few seconds and repeat Steps 1 through 7 at least 10 times every 1-2 hours when you are awake. Take your time and take a few normal breaths between deep breaths. 9. The spirometer may include an indicator to show your best effort. Use the indicator as a goal to work toward during each repetition. 10. After each set of 10 deep breaths, practice coughing to be sure your lungs are clear. If you have an incision (the cut made at the time of surgery), support your incision when coughing  by placing a pillow or rolled up towels firmly against it. Once you are able to get out of bed, walk around indoors and cough well. You may stop using the incentive spirometer when instructed by your caregiver.  RISKS AND COMPLICATIONS  Take your time so you do not get dizzy or light-headed.  If you are in pain, you may need to take or ask for pain medication before doing incentive spirometry. It is harder to take a deep breath if you are having pain. AFTER USE  Rest and breathe slowly and easily.  It can be helpful to keep track of a log of your progress. Your caregiver can provide you with a simple table to help with this. If you are using the spirometer at home, follow these instructions: [place] IF:   You are having difficultly using the spirometer.  You have trouble using the spirometer as often as instructed.  Your pain medication is not giving enough relief while using the spirometer.  You develop fever of 100.5 F (38.1 C) or higher. SEEK IMMEDIATE MEDICAL CARE IF:   You cough up bloody sputum that had not been present before.  You develop fever of 102 F (38.9 C) or greater.  You develop worsening pain at or near the incision site. MAKE SURE YOU:   Understand these instructions.  Will watch your condition.  Will get help right away if you are not doing well or get worse. Document Released: 01/21/2007 Document Revised: 12/03/2011 Document Reviewed: 03/24/2007 [facility] Patient Information 2014 [place], [place].   ________________________________________________________________________

## 2020-12-07 NOTE — H&P (Signed)
TOTAL KNEE ADMISSION H&P  Patient is being admitted for left total knee arthroplasty.  Subjective:  Chief Complaint: Left knee pain.  HPI: Cassandra [name], 75 y.o. female has a history of pain and functional disability in the left knee due to arthritis and has failed non-surgical conservative treatments for greater than 12 weeks to include viscosupplementation injections and activity modification. Onset of symptoms was gradual, starting several years ago with gradually worsening course since that time. The patient noted no past surgery on the left knee.  Patient currently rates pain in the left knee at 7 out of 10 with activity. Patient has worsening of pain with activity and weight bearing, pain that interferes with activities of daily living, pain with passive range of motion and crepitus. Patient has evidence of severe bone-on-bone arthritis in the medial compartment of both knees with varus deformity, slightly worse on the right than the left. She also has patellofemoral arthritis in both by imaging studies. There is no active infection.  Patient Active Problem List   Diagnosis Date Noted  . Obstructive sleep apnea 06/19/2019  . Malignant neoplasm of overlapping sites of right breast in female, estrogen receptor positive ([place]) 05/14/2019  . Coagulopathy ([place]) 05/14/2019  . Morbid obesity ([place]) 05/14/2019  . Chronic thromboembolic disease ([place]) 60/73/7106  . Pulmonary vascular disease ([place]) 04/08/2019  . Hypothyroidism 04/07/2019  . Essential hypertension 04/07/2019  . Pulmonary embolism ([place]) 04/06/2019  . Abnormal liver function 04/06/2019  . Hypokalemia 04/06/2019  . Incisional hernia 04/06/2016    Past Medical History:  Diagnosis Date  . Cancer [facility])    Breast cancer right '06- surgery, chemo, radiation- released after 5 yrs  . Hx of seasonal allergies    OTC med used  . Hypertension   . Hypothyroidism     Past Surgical History:  Procedure Laterality Date  . BREAST  SURGERY Right    modified partial mastectomy/ lymph nodes dissection  . COLON SURGERY  2/17  . INCISIONAL HERNIA REPAIR N/A 04/06/2016   Procedure: LAPAROSCOPIC ASSISTED INCISIONAL HERNIA REPAIR WITH MESH;  Surgeon: Leighton Ruff, MD;  Location: [facility] ORS;  Service: General;  Laterality: N/A;  . TONSILLECTOMY      Prior to Admission medications   Medication Sig Start Date End Date Taking? Authorizing Provider  apixaban (ELIQUIS) 5 MG TABS tablet Take 5 mg by mouth 2 (two) times daily.   Yes [provider]  atorvastatin (LIPITOR) 80 MG tablet Take 80 mg by mouth every evening.   Yes [provider]  b complex vitamins capsule Take 1 capsule by mouth daily.   Yes [provider]  benazepril (LOTENSIN) 40 MG tablet Take 40 mg by mouth at bedtime.  03/16/16  Yes [provider]  Beta Carotene (VITAMIN A) 25000 UNIT capsule Take 25,000 Units by mouth daily.   Yes [provider]  bisoprolol-hydrochlorothiazide (ZIAC) 10-6.25 MG tablet Take 1 tablet by mouth every morning.   Yes [provider]  CALCIUM PO Take 1 tablet by mouth 2 (two) times daily.   Yes [provider]  Cholecalciferol (VITAMIN D3) 2000 units capsule Take 2,000 Units by mouth every morning.   Yes [provider]  levothyroxine (SYNTHROID, LEVOTHROID) 100 MCG tablet Take 100 mcg by mouth daily before breakfast.   Yes [provider]  Lysine 500 MG CAPS Take 500 mg by mouth every morning.   Yes [provider]  Multiple Vitamin (MULTIVITAMIN WITH MINERALS) TABS tablet Take 1 tablet by mouth every  morning.   Yes [provider]  NIFEdipine (ADALAT CC) 30 MG 24 hr tablet Take 30 mg by mouth daily after breakfast. 03/17/19  Yes [provider]  Omega-3 Fatty Acids (FISH OIL) 1000 MG CAPS Take 1,000 mg by mouth every morning. 06/26/12  Yes [provider]  vitamin E 180 MG (400 UNITS) capsule Take 400 Units by mouth daily.    Yes [provider]  albuterol (VENTOLIN HFA) 108 (90 Base) MCG/ACT inhaler Inhale 2 puffs into the lungs every 4 (four) hours as needed for wheezing or shortness of breath.  Patient not taking: Reported on 11/25/2020 03/31/19   [provider]  Coenzyme Q10 (CO Q 10 PO) Take 1 tablet by mouth every morning. Patient not taking: Reported on 11/25/2020    [provider]  loratadine (CLARITIN) 10 MG tablet Take 10 mg by mouth every morning. Patient not taking: Reported on 11/25/2020    [provider]  TURMERIC PO Take 1 tablet by mouth daily. Patient not taking: Reported on 11/25/2020    [provider]    No Known Allergies  Social History   Socioeconomic History  . Marital status: Single    Spouse name: Not on file  . Number of children: Not on file  . Years of education: Not on file  . Highest education level: Not on file  Occupational History  . Not on file  Tobacco Use  . Smoking status: Never Smoker  . Smokeless tobacco: Never Used  Substance and Sexual Activity  . Alcohol use: Yes    Comment: rare  . Drug use: No  . Sexual activity: Not on file  Other Topics Concern  . Not on file  Social History Narrative  . Not on file   Social Determinants of Health   Financial Resource Strain: Not on file  Food Insecurity: Not on file  Transportation Needs: Not on file  Physical Activity: Not on file  Stress: Not on file  Social Connections: Not on file  Intimate Partner Violence: Not on file    Tobacco Use: Low Risk   . Smoking Tobacco Use: Never Smoker  . Smokeless Tobacco Use: Never Used   Social History   Substance and Sexual Activity  Alcohol Use Yes   Comment: rare    Family History  Problem Relation Age of Onset  . COPD Father     Review of Systems  Constitutional: Negative for chills and fever.  HENT: Negative for congestion, sore throat and tinnitus.   Eyes: Negative for double vision, photophobia and pain.   Respiratory: Negative for cough, shortness of breath and wheezing.   Cardiovascular: Negative for chest pain, palpitations and orthopnea.  Gastrointestinal: Negative for heartburn, nausea and vomiting.  Genitourinary: Negative for dysuria, frequency and urgency.  Musculoskeletal: Positive for joint pain.  Neurological: Negative for dizziness, weakness and headaches.    Objective:  Physical Exam: Well nourished and well developed.  General: Alert and oriented x3, cooperative and pleasant, no acute distress.  Head: normocephalic, atraumatic, neck supple.  Eyes: EOMI.  Respiratory: breath sounds clear in all fields, no wheezing, rales, or rhonchi. Cardiovascular: Regular rate and rhythm, no murmurs, gallops or rubs.  Abdomen: non-tender to palpation and soft, normoactive bowel sounds. Musculoskeletal:  Left Knee Exam:  No effusion.  Range of motion is 5-125 degrees.  Moderate crepitus on range of motion of the knee.  Slight medial joint line tenderness.  No lateral joint line tenderness.  Stable knee.  Calves soft and nontender. Motor function intact in LE. Strength 5/5 LE bilaterally. Neuro: Distal pulses 2+. Sensation to light touch intact in LE.  Imaging Review Plain radiographs demonstrate severe degenerative joint disease of the left knee. The overall alignment is significant varus. The bone quality appears to be adequate for age and reported activity level.  Assessment/Plan:  End stage arthritis, left knee   The patient history, physical examination, clinical judgment of the provider and imaging studies are consistent with end stage degenerative joint disease of the left knee and total knee arthroplasty is deemed medically necessary. The treatment options including medical management, injection therapy arthroscopy and arthroplasty were discussed at length. The risks and benefits of total knee arthroplasty were presented and reviewed. The risks due to aseptic loosening,  infection, stiffness, patella tracking problems, thromboembolic complications and other imponderables were discussed. The patient acknowledged the explanation, agreed to proceed with the plan and consent was signed. Patient is being admitted for inpatient treatment for surgery, pain control, PT, OT, prophylactic antibiotics, VTE prophylaxis, progressive ambulation and ADLs and discharge planning. The patient is planning to be discharged home.   Patient's anticipated LOS is less than 2 midnights, meeting these requirements: - Younger than 59 - Lives within 1 hour of care - Has a competent adult at home to recover with post-op recover - NO history of  - Chronic pain requiring opiods  - Diabetes  - Coronary Artery Disease  - Heart failure  - Heart attack  - Stroke  - DVT/VTE  - Cardiac arrhythmia  - Respiratory Failure/COPD  - Renal failure  - Anemia  - Advanced Liver disease  Therapy Plans: Outpatient therapy at [facility]) Disposition: Home with neighbor Planned DVT Prophylaxis: Eliquis 5 mg BID (hx PE) DME Needed: Gilford Rile, 3-in-1 PCP: Harlan Stains, MD (clearance received) Pulmonologist: Baltazar Apo, MD (clearance received) TXA: Topical  Allergies: NKDA Anesthesia Concerns: Sleep apnea BMI: 39.6 Last HgbA1c: Not diabetic Pharmacy: [facility] Richarda Osmond)  - Patient was instructed on what medications to stop prior to surgery. - Follow-up visit in 2 weeks with Dr. Wynelle Link - Begin physical therapy following surgery - Pre-operative [name] work as pre-surgical testing - Prescriptions will be provided in hospital at time of discharge  Theresa Duty, PA-C Orthopedic Surgery [facility] [place]

## 2020-12-08 ENCOUNTER — Other Ambulatory Visit: Payer: Self-pay

## 2020-12-08 ENCOUNTER — Encounter (HOSPITAL_COMMUNITY)
Admission: RE | Admit: 2020-12-08 | Discharge: 2020-12-08 | Disposition: A | Discharge status: Home / Self Care | Payer: Medicare HMO | Source: Ambulatory Visit | Attending: Orthopedic Surgery | Admitting: Orthopedic Surgery

## 2020-12-08 ENCOUNTER — Encounter (HOSPITAL_COMMUNITY): Payer: Self-pay

## 2020-12-08 HISTORY — DX: Unspecified osteoarthritis, unspecified site: M19.90

## 2020-12-08 HISTORY — DX: Dyspnea, unspecified: R06.00

## 2020-12-08 NOTE — Progress Notes (Signed)
COVID Vaccine Completed: yES Date COVID Vaccine completed: 08/16/20 bOASTER COVID vaccine manufacturer: [place]      PCP - Dr. Harlan Stains Cardiologist -  Pulmonologist: Dr. Baltazar Apo: [place]: 10/21/20: Chart. Chest x-ray -  EKG - 11/22/20: Chart Stress Test -  ECHO - 06/18/19 Cardiac Cath -  Pacemaker/ICD device last checked:  Sleep Study - Yes  CPAP - Yes  Fasting Blood Sugar -  Checks Blood Sugar _____ times a day  Blood Thinner Instructions: Eliquis will be on hold 3 days prior to the surgery as per Dr. Wynelle Link. Aspirin Instructions: Last Dose:  Anesthesia review: Hx: HTN,PE.,OSA(CPAP)  Patient denies shortness of breath, fever, cough and chest pain at PAT appointment   Patient verbalized understanding of instructions that were given to them at the PAT appointment. Patient was also instructed that they will need to review over the PAT instructions again at home before surgery.

## 2020-12-11 DIAGNOSIS — I2699 Other pulmonary embolism without acute cor pulmonale: Secondary | ICD-10-CM | POA: Diagnosis not present

## 2020-12-15 ENCOUNTER — Other Ambulatory Visit (HOSPITAL_COMMUNITY)
Admission: RE | Admit: 2020-12-15 | Discharge: 2020-12-15 | Disposition: A | Discharge status: Home / Self Care | Payer: Medicare HMO | Source: Ambulatory Visit | Attending: Orthopedic Surgery | Admitting: Orthopedic Surgery

## 2020-12-15 ENCOUNTER — Other Ambulatory Visit: Payer: Self-pay

## 2020-12-15 ENCOUNTER — Encounter (HOSPITAL_COMMUNITY)
Admission: RE | Admit: 2020-12-15 | Discharge: 2020-12-15 | Disposition: A | Discharge status: Home / Self Care | Payer: Medicare HMO | Source: Ambulatory Visit | Attending: Orthopedic Surgery | Admitting: Orthopedic Surgery

## 2020-12-15 DIAGNOSIS — Z20822 Contact with and (suspected) exposure to covid-19: Secondary | ICD-10-CM | POA: Diagnosis not present

## 2020-12-15 DIAGNOSIS — Z01812 Encounter for preprocedural laboratory examination: Secondary | ICD-10-CM | POA: Insufficient documentation

## 2020-12-15 LAB — COMPREHENSIVE METABOLIC PANEL
ALT: 21 U/L (ref 0–44)
AST: 27 U/L (ref 15–41)
Albumin: 4.4 g/dL (ref 3.5–5.0)
Alkaline Phosphatase: 68 U/L (ref 38–126)
Anion gap: 10 (ref 5–15)
BUN: 16 mg/dL (ref 8–23)
CO2: 25 mmol/L (ref 22–32)
Calcium: 9.7 mg/dL (ref 8.9–10.3)
Chloride: 108 mmol/L (ref 98–111)
Creatinine, Ser: 0.88 mg/dL (ref 0.44–1.00)
GFR, Estimated: >60 mL/min (ref >60)
Glucose, Bld: 123 mg/dL — ABNORMAL HIGH (ref 70–99)
Potassium: 4 mmol/L (ref 3.5–5.1)
Sodium: 143 mmol/L (ref 135–145)
Total Bilirubin: 0.7 mg/dL (ref 0.3–1.2)
Total Protein: 7.4 g/dL (ref 6.5–8.1)

## 2020-12-15 LAB — PROTIME-INR
INR: 1.2 (ref 0.8–1.2)
Prothrombin Time: 14.6 seconds (ref 11.4–15.2)

## 2020-12-15 LAB — CBC
HCT: 42.4 % (ref 36.0–46.0)
Hemoglobin: 13.6 g/dL (ref 12.0–15.0)
MCH: 31.8 pg (ref 26.0–34.0)
MCHC: 32.1 g/dL (ref 30.0–36.0)
MCV: 99.1 fL (ref 80.0–100.0)
Platelets: 365 10*3/uL (ref 150–400)
RBC: 4.28 MIL/uL (ref 3.87–5.11)
RDW: 14.1 % (ref 11.5–15.5)
WBC: 9.2 10*3/uL (ref 4.0–10.5)
nRBC: 0 % (ref 0.0–0.2)

## 2020-12-15 LAB — SURGICAL PCR SCREEN
MRSA, PCR: NEGATIVE
Staphylococcus aureus: NEGATIVE

## 2020-12-15 LAB — APTT: aPTT: 34 seconds (ref 24–36)

## 2020-12-15 LAB — SARS CORONAVIRUS 2 (TAT 6-24 HRS): SARS Coronavirus 2: NEGATIVE

## 2020-12-18 MED ORDER — TRANEXAMIC ACID 1000 MG/10ML IV SOLN
2000.0000 mg | INTRAVENOUS | Status: AC
  Filled 2020-12-18: qty 20

## 2020-12-18 MED ORDER — BUPIVACAINE LIPOSOME 1.3 % IJ SUSP
20.0000 mL | INTRAMUSCULAR | Status: DC
  Filled 2020-12-18: qty 20

## 2020-12-19 ENCOUNTER — Encounter (HOSPITAL_COMMUNITY): Payer: Self-pay | Admitting: Orthopedic Surgery

## 2020-12-19 ENCOUNTER — Observation Stay (HOSPITAL_COMMUNITY)
Admission: RE | Admit: 2020-12-19 | Discharge: 2020-12-20 | Disposition: A | Discharge status: Home / Self Care | Payer: Medicare HMO | Source: Ambulatory Visit | Attending: Orthopedic Surgery | Admitting: Orthopedic Surgery

## 2020-12-19 ENCOUNTER — Encounter (HOSPITAL_COMMUNITY)
Admission: RE | Disposition: A | Discharge status: Home / Self Care | Payer: Self-pay | Source: Ambulatory Visit | Attending: Orthopedic Surgery

## 2020-12-19 ENCOUNTER — Ambulatory Visit (HOSPITAL_COMMUNITY): Payer: Medicare HMO | Admitting: Anesthesiology

## 2020-12-19 ENCOUNTER — Other Ambulatory Visit: Payer: Self-pay

## 2020-12-19 DIAGNOSIS — I739 Peripheral vascular disease, unspecified: Secondary | ICD-10-CM | POA: Diagnosis not present

## 2020-12-19 DIAGNOSIS — Z853 Personal history of malignant neoplasm of breast: Secondary | ICD-10-CM | POA: Diagnosis not present

## 2020-12-19 DIAGNOSIS — Z86711 Personal history of pulmonary embolism: Secondary | ICD-10-CM | POA: Insufficient documentation

## 2020-12-19 DIAGNOSIS — E039 Hypothyroidism, unspecified: Secondary | ICD-10-CM | POA: Diagnosis not present

## 2020-12-19 DIAGNOSIS — M179 Osteoarthritis of knee, unspecified: Secondary | ICD-10-CM | POA: Diagnosis present

## 2020-12-19 DIAGNOSIS — M1712 Unilateral primary osteoarthritis, left knee: Secondary | ICD-10-CM | POA: Diagnosis not present

## 2020-12-19 DIAGNOSIS — G4733 Obstructive sleep apnea (adult) (pediatric): Secondary | ICD-10-CM | POA: Insufficient documentation

## 2020-12-19 DIAGNOSIS — E876 Hypokalemia: Secondary | ICD-10-CM | POA: Diagnosis not present

## 2020-12-19 DIAGNOSIS — Z79899 Other long term (current) drug therapy: Secondary | ICD-10-CM | POA: Diagnosis not present

## 2020-12-19 DIAGNOSIS — I10 Essential (primary) hypertension: Secondary | ICD-10-CM | POA: Insufficient documentation

## 2020-12-19 DIAGNOSIS — D689 Coagulation defect, unspecified: Secondary | ICD-10-CM | POA: Diagnosis not present

## 2020-12-19 DIAGNOSIS — I2724 Chronic thromboembolic pulmonary hypertension: Secondary | ICD-10-CM | POA: Insufficient documentation

## 2020-12-19 DIAGNOSIS — M25562 Pain in left knee: Secondary | ICD-10-CM | POA: Diagnosis present

## 2020-12-19 DIAGNOSIS — M1710 Unilateral primary osteoarthritis, unspecified knee: Secondary | ICD-10-CM

## 2020-12-19 DIAGNOSIS — Z7901 Long term (current) use of anticoagulants: Secondary | ICD-10-CM | POA: Insufficient documentation

## 2020-12-19 HISTORY — PX: TOTAL KNEE ARTHROPLASTY: SHX125

## 2020-12-19 LAB — TYPE AND SCREEN
ABO/RH(D): A POS
Antibody Screen: NEGATIVE

## 2020-12-19 SURGERY — ARTHROPLASTY, KNEE, TOTAL
Anesthesia: Spinal | Site: Knee | Laterality: Left

## 2020-12-19 MED ORDER — METOCLOPRAMIDE HCL 5 MG/ML IJ SOLN: 5.0000 mg | Freq: Three times a day (TID) | INTRAMUSCULAR | Status: DC | PRN

## 2020-12-19 MED ORDER — METHOCARBAMOL 500 MG PO TABS: 500.0000 mg | ORAL_TABLET | Freq: Four times a day (QID) | ORAL | Status: DC | PRN

## 2020-12-19 MED ORDER — FENTANYL CITRATE (PF) 100 MCG/2ML IJ SOLN
INTRAMUSCULAR | Status: AC
  Filled 2020-12-19: qty 2

## 2020-12-19 MED ORDER — BISACODYL 10 MG RE SUPP: 10.0000 mg | Freq: Every day | RECTAL | Status: DC | PRN

## 2020-12-19 MED ORDER — BUPIVACAINE IN DEXTROSE 0.75-8.25 % IT SOLN
INTRATHECAL | Status: DC | PRN
  Administered 2020-12-19: 1.5 mL via INTRATHECAL

## 2020-12-19 MED ORDER — ACETAMINOPHEN 10 MG/ML IV SOLN
1000.0000 mg | Freq: Four times a day (QID) | INTRAVENOUS | Status: DC
  Administered 2020-12-19: 1000 mg via INTRAVENOUS
  Filled 2020-12-19: qty 100

## 2020-12-19 MED ORDER — ONDANSETRON HCL 4 MG PO TABS: 4.0000 mg | ORAL_TABLET | Freq: Four times a day (QID) | ORAL | Status: DC | PRN

## 2020-12-19 MED ORDER — MIDAZOLAM HCL 2 MG/2ML IJ SOLN
1.0000 mg | INTRAMUSCULAR | Status: DC
  Filled 2020-12-19: qty 2

## 2020-12-19 MED ORDER — OXYCODONE HCL 5 MG PO TABS
5.0000 mg | ORAL_TABLET | ORAL | Status: DC | PRN
  Administered 2020-12-19: 10 mg via ORAL
  Filled 2020-12-19 (×2): qty 2

## 2020-12-19 MED ORDER — PHENYLEPHRINE HCL-NACL 10-0.9 MG/250ML-% IV SOLN
INTRAVENOUS | Status: DC | PRN
  Administered 2020-12-19: 35 ug/min via INTRAVENOUS

## 2020-12-19 MED ORDER — DEXAMETHASONE SODIUM PHOSPHATE 10 MG/ML IJ SOLN
8.0000 mg | Freq: Once | INTRAMUSCULAR | Status: AC
  Administered 2020-12-19: 10 mg via INTRAVENOUS

## 2020-12-19 MED ORDER — METHOCARBAMOL 500 MG IVPB - SIMPLE MED
500.0000 mg | Freq: Four times a day (QID) | INTRAVENOUS | Status: DC | PRN
  Administered 2020-12-19: 500 mg via INTRAVENOUS
  Filled 2020-12-19: qty 500
  Filled 2020-12-19: qty 50

## 2020-12-19 MED ORDER — OXYCODONE HCL 5 MG PO TABS: 5.0000 mg | ORAL_TABLET | Freq: Once | ORAL | Status: DC | PRN

## 2020-12-19 MED ORDER — BUPIVACAINE LIPOSOME 1.3 % IJ SUSP
INTRAMUSCULAR | Status: DC | PRN
  Administered 2020-12-19: 20 mL

## 2020-12-19 MED ORDER — PROPOFOL 10 MG/ML IV BOLUS
INTRAVENOUS | Status: AC
  Filled 2020-12-19: qty 20

## 2020-12-19 MED ORDER — FLEET ENEMA 7-19 GM/118ML RE ENEM: 1.0000 | ENEMA | Freq: Once | RECTAL | Status: DC | PRN

## 2020-12-19 MED ORDER — LEVOTHYROXINE SODIUM 100 MCG PO TABS
100.0000 ug | ORAL_TABLET | Freq: Every day | ORAL | Status: DC
  Administered 2020-12-20: 100 ug via ORAL
  Filled 2020-12-19: qty 1

## 2020-12-19 MED ORDER — CEFAZOLIN SODIUM-DEXTROSE 2-4 GM/100ML-% IV SOLN
2.0000 g | Freq: Four times a day (QID) | INTRAVENOUS | Status: AC
  Administered 2020-12-19 (×2): 2 g via INTRAVENOUS
  Filled 2020-12-19 (×2): qty 100

## 2020-12-19 MED ORDER — DEXAMETHASONE SODIUM PHOSPHATE 10 MG/ML IJ SOLN
INTRAMUSCULAR | Status: AC
  Filled 2020-12-19: qty 1

## 2020-12-19 MED ORDER — CHLORHEXIDINE GLUCONATE 0.12 % MT SOLN
15.0000 mL | Freq: Once | OROMUCOSAL | Status: AC
  Administered 2020-12-19: 15 mL via OROMUCOSAL

## 2020-12-19 MED ORDER — PROPOFOL 10 MG/ML IV BOLUS
INTRAVENOUS | Status: DC | PRN
  Administered 2020-12-19 (×2): 20 mg via INTRAVENOUS

## 2020-12-19 MED ORDER — METOCLOPRAMIDE HCL 5 MG PO TABS: 5.0000 mg | ORAL_TABLET | Freq: Three times a day (TID) | ORAL | Status: DC | PRN

## 2020-12-19 MED ORDER — FENTANYL CITRATE (PF) 100 MCG/2ML IJ SOLN: 25.0000 ug | INTRAMUSCULAR | Status: DC | PRN

## 2020-12-19 MED ORDER — CEFAZOLIN SODIUM-DEXTROSE 2-4 GM/100ML-% IV SOLN
2.0000 g | INTRAVENOUS | Status: AC
  Administered 2020-12-19: 2 g via INTRAVENOUS
  Filled 2020-12-19: qty 100

## 2020-12-19 MED ORDER — LACTATED RINGERS IV SOLN: INTRAVENOUS | Status: DC

## 2020-12-19 MED ORDER — MENTHOL 3 MG MT LOZG: 1.0000 | LOZENGE | OROMUCOSAL | Status: DC | PRN

## 2020-12-19 MED ORDER — ACETAMINOPHEN 500 MG PO TABS
1000.0000 mg | ORAL_TABLET | Freq: Four times a day (QID) | ORAL | Status: DC
  Administered 2020-12-19 – 2020-12-20 (×3): 1000 mg via ORAL
  Filled 2020-12-19 (×3): qty 2

## 2020-12-19 MED ORDER — ONDANSETRON HCL 4 MG/2ML IJ SOLN: 4.0000 mg | Freq: Four times a day (QID) | INTRAMUSCULAR | Status: DC | PRN

## 2020-12-19 MED ORDER — TRAMADOL HCL 50 MG PO TABS
50.0000 mg | ORAL_TABLET | Freq: Four times a day (QID) | ORAL | Status: DC | PRN
  Administered 2020-12-19 – 2020-12-20 (×2): 100 mg via ORAL
  Filled 2020-12-19 (×4): qty 2

## 2020-12-19 MED ORDER — PROPOFOL 500 MG/50ML IV EMUL
INTRAVENOUS | Status: DC | PRN
  Administered 2020-12-19: 50 ug/kg/min via INTRAVENOUS

## 2020-12-19 MED ORDER — STERILE WATER FOR IRRIGATION IR SOLN
Status: DC | PRN
  Administered 2020-12-19: 2000 mL

## 2020-12-19 MED ORDER — POVIDONE-IODINE 10 % EX SWAB
2.0000 "application " | Freq: Once | CUTANEOUS | Status: AC
  Administered 2020-12-19: 2 via TOPICAL

## 2020-12-19 MED ORDER — SODIUM CHLORIDE 0.9 % IR SOLN
Status: DC | PRN
  Administered 2020-12-19: 1000 mL

## 2020-12-19 MED ORDER — FENTANYL CITRATE (PF) 100 MCG/2ML IJ SOLN
INTRAMUSCULAR | Status: DC | PRN
  Administered 2020-12-19: 50 ug via INTRAVENOUS

## 2020-12-19 MED ORDER — ONDANSETRON HCL 4 MG/2ML IJ SOLN: 4.0000 mg | Freq: Once | INTRAMUSCULAR | Status: DC | PRN

## 2020-12-19 MED ORDER — SODIUM CHLORIDE (PF) 0.9 % IJ SOLN
INTRAMUSCULAR | Status: DC | PRN
  Administered 2020-12-19: 60 mL

## 2020-12-19 MED ORDER — TRANEXAMIC ACID 1000 MG/10ML IV SOLN
INTRAVENOUS | Status: DC | PRN
  Administered 2020-12-19: 2000 mg via TOPICAL

## 2020-12-19 MED ORDER — PHENOL 1.4 % MT LIQD: 1.0000 | OROMUCOSAL | Status: DC | PRN

## 2020-12-19 MED ORDER — FENTANYL CITRATE (PF) 100 MCG/2ML IJ SOLN
50.0000 ug | INTRAMUSCULAR | Status: DC
  Filled 2020-12-19: qty 2

## 2020-12-19 MED ORDER — GABAPENTIN 300 MG PO CAPS
300.0000 mg | ORAL_CAPSULE | Freq: Three times a day (TID) | ORAL | Status: DC
  Administered 2020-12-19 – 2020-12-20 (×2): 300 mg via ORAL
  Filled 2020-12-19 (×2): qty 1

## 2020-12-19 MED ORDER — 0.9 % SODIUM CHLORIDE (POUR BTL) OPTIME
TOPICAL | Status: DC | PRN
  Administered 2020-12-19: 1000 mL

## 2020-12-19 MED ORDER — POLYETHYLENE GLYCOL 3350 17 G PO PACK: 17.0000 g | PACK | Freq: Every day | ORAL | Status: DC | PRN

## 2020-12-19 MED ORDER — TRANEXAMIC ACID-NACL 1000-0.7 MG/100ML-% IV SOLN
INTRAVENOUS | Status: AC
  Filled 2020-12-19: qty 100

## 2020-12-19 MED ORDER — APIXABAN 2.5 MG PO TABS
2.5000 mg | ORAL_TABLET | Freq: Two times a day (BID) | ORAL | Status: DC
  Administered 2020-12-20: 2.5 mg via ORAL
  Filled 2020-12-19: qty 1

## 2020-12-19 MED ORDER — DEXAMETHASONE SODIUM PHOSPHATE 10 MG/ML IJ SOLN
10.0000 mg | Freq: Once | INTRAMUSCULAR | Status: AC
  Administered 2020-12-20: 10 mg via INTRAVENOUS
  Filled 2020-12-19: qty 1

## 2020-12-19 MED ORDER — SODIUM CHLORIDE 0.9 % IV SOLN: INTRAVENOUS | Status: DC

## 2020-12-19 MED ORDER — MORPHINE SULFATE (PF) 2 MG/ML IV SOLN: 0.5000 mg | INTRAVENOUS | Status: DC | PRN

## 2020-12-19 MED ORDER — MIDAZOLAM HCL 5 MG/5ML IJ SOLN
INTRAMUSCULAR | Status: DC | PRN
  Administered 2020-12-19: 2 mg via INTRAVENOUS

## 2020-12-19 MED ORDER — ONDANSETRON HCL 4 MG/2ML IJ SOLN
INTRAMUSCULAR | Status: DC | PRN
  Administered 2020-12-19: 4 mg via INTRAVENOUS

## 2020-12-19 MED ORDER — OXYCODONE HCL 5 MG/5ML PO SOLN
5.0000 mg | Freq: Once | ORAL | Status: DC | PRN
Start: 2020-12-19 — End: 2020-12-19

## 2020-12-19 MED ORDER — NIFEDIPINE ER OSMOTIC RELEASE 30 MG PO TB24
30.0000 mg | ORAL_TABLET | Freq: Every day | ORAL | Status: DC
  Administered 2020-12-20: 30 mg via ORAL
  Filled 2020-12-19: qty 1

## 2020-12-19 MED ORDER — ATORVASTATIN CALCIUM 40 MG PO TABS
80.0000 mg | ORAL_TABLET | Freq: Every evening | ORAL | Status: DC
Start: 2020-12-19 — End: 2020-12-20
  Administered 2020-12-19: 80 mg via ORAL
  Filled 2020-12-19: qty 2

## 2020-12-19 MED ORDER — ONDANSETRON HCL 4 MG/2ML IJ SOLN
INTRAMUSCULAR | Status: AC
  Filled 2020-12-19: qty 2

## 2020-12-19 MED ORDER — BISOPROLOL-HYDROCHLOROTHIAZIDE 10-6.25 MG PO TABS
1.0000 | ORAL_TABLET | Freq: Every morning | ORAL | Status: DC
  Administered 2020-12-20: 1 via ORAL
  Filled 2020-12-19: qty 1

## 2020-12-19 MED ORDER — DOCUSATE SODIUM 100 MG PO CAPS
100.0000 mg | ORAL_CAPSULE | Freq: Two times a day (BID) | ORAL | Status: DC
  Administered 2020-12-19 – 2020-12-20 (×2): 100 mg via ORAL
  Filled 2020-12-19 (×2): qty 1

## 2020-12-19 MED ORDER — MIDAZOLAM HCL 2 MG/2ML IJ SOLN
INTRAMUSCULAR | Status: AC
  Filled 2020-12-19: qty 2

## 2020-12-19 MED ORDER — PROPOFOL 1000 MG/100ML IV EMUL
INTRAVENOUS | Status: AC
  Filled 2020-12-19: qty 100

## 2020-12-19 MED ORDER — ORAL CARE MOUTH RINSE: 15.0000 mL | Freq: Once | OROMUCOSAL | Status: AC

## 2020-12-19 MED ORDER — DIPHENHYDRAMINE HCL 12.5 MG/5ML PO ELIX
12.5000 mg | ORAL_SOLUTION | ORAL | Status: DC | PRN
Start: 2020-12-19 — End: 2020-12-20

## 2020-12-19 SURGICAL SUPPLY — 54 items
ATTUNE MED DOME PAT 38 KNEE (Knees) ×1 IMPLANT
ATTUNE PS FEM LT SZ 4 CEM KNEE (Femur) ×1 IMPLANT
ATTUNE PSRP INSR SZ4 10 KNEE (Insert) ×1 IMPLANT
BAG SPEC THK2 15X12 ZIP CLS (MISCELLANEOUS) ×1
BAG ZIPLOCK 12X15 (MISCELLANEOUS) ×2 IMPLANT
BASE TIBIAL ROT PLAT SZ 3 KNEE (Knees) IMPLANT
BLADE SAG 18X100X1.27 (BLADE) ×2 IMPLANT
BLADE SAW SGTL 11.0X1.19X90.0M (BLADE) ×2 IMPLANT
BNDG ELASTIC 6X5.8 VLCR STR LF (GAUZE/BANDAGES/DRESSINGS) ×2 IMPLANT
BOWL SMART MIX CTS (DISPOSABLE) ×2 IMPLANT
BSPLAT TIB 3 CMNT ROT PLAT STR (Knees) ×1 IMPLANT
CEMENT HV SMART SET (Cement) ×4 IMPLANT
CLSR STERI-STRIP ANTIMIC 1/2X4 (GAUZE/BANDAGES/DRESSINGS) ×1 IMPLANT
COVER SURGICAL LIGHT HANDLE (MISCELLANEOUS) ×2 IMPLANT
COVER WAND RF STERILE (DRAPES) IMPLANT
CUFF TOURN SGL QUICK 34 (TOURNIQUET CUFF) ×2
CUFF TRNQT CYL 34X4.125X (TOURNIQUET CUFF) ×1 IMPLANT
DECANTER SPIKE VIAL GLASS SM (MISCELLANEOUS) ×2 IMPLANT
DRAPE U-SHAPE 47X51 STRL (DRAPES) ×2 IMPLANT
DRSG AQUACEL AG ADV 3.5X10 (GAUZE/BANDAGES/DRESSINGS) ×2 IMPLANT
DURAPREP 26ML APPLICATOR (WOUND CARE) ×2 IMPLANT
ELECT REM PT RETURN 15FT ADLT (MISCELLANEOUS) ×2 IMPLANT
GLOVE SRG 8 PF TXTR STRL LF DI (GLOVE) ×1 IMPLANT
GLOVE SURG ENC MOIS LTX SZ6.5 (GLOVE) ×2 IMPLANT
GLOVE SURG ENC MOIS LTX SZ8 (GLOVE) ×4 IMPLANT
GLOVE SURG UNDER POLY LF SZ6.5 (GLOVE) ×2 IMPLANT
GLOVE SURG UNDER POLY LF SZ8 (GLOVE) ×2
GLOVE SURG UNDER POLY LF SZ8.5 (GLOVE) ×2 IMPLANT
GOWN STRL REUS W/TWL LRG LVL3 (GOWN DISPOSABLE) ×4 IMPLANT
GOWN STRL REUS W/TWL XL LVL3 (GOWN DISPOSABLE) ×2 IMPLANT
HANDPIECE INTERPULSE COAX TIP (DISPOSABLE) ×2
HOLDER FOLEY CATH W/STRAP (MISCELLANEOUS) IMPLANT
IMMOBILIZER KNEE 20 (SOFTGOODS) ×2
IMMOBILIZER KNEE 20 THIGH 36 (SOFTGOODS) ×1 IMPLANT
KIT TURNOVER KIT A (KITS) ×2 IMPLANT
MANIFOLD NEPTUNE II (INSTRUMENTS) ×2 IMPLANT
NS IRRIG 1000ML POUR BTL (IV SOLUTION) ×2 IMPLANT
PACK TOTAL KNEE CUSTOM (KITS) ×2 IMPLANT
PADDING CAST COTTON 6X4 STRL (CAST SUPPLIES) ×3 IMPLANT
PENCIL SMOKE EVACUATOR (MISCELLANEOUS) ×2 IMPLANT
PIN DRILL FIX HALF THREAD (BIT) ×1 IMPLANT
PIN FIX SIGMA LCS THRD HI (PIN) ×1 IMPLANT
PROTECTOR NERVE ULNAR (MISCELLANEOUS) ×2 IMPLANT
SET HNDPC FAN SPRY TIP SCT (DISPOSABLE) ×1 IMPLANT
STRIP CLOSURE SKIN 1/2X4 (GAUZE/BANDAGES/DRESSINGS) ×4 IMPLANT
SUT MNCRL AB 4-0 PS2 18 (SUTURE) ×2 IMPLANT
SUT STRATAFIX 0 PDS 27 VIOLET (SUTURE) ×2
SUT VIC AB 2-0 CT1 27 (SUTURE) ×8
SUT VIC AB 2-0 CT1 TAPERPNT 27 (SUTURE) ×3 IMPLANT
SUTURE STRATFX 0 PDS 27 VIOLET (SUTURE) ×1 IMPLANT
TIBIAL BASE ROT PLAT SZ 3 KNEE (Knees) ×2 IMPLANT
TRAY FOLEY MTR SLVR 16FR STAT (SET/KITS/TRAYS/PACK) ×2 IMPLANT
WATER STERILE IRR 1000ML POUR (IV SOLUTION) ×4 IMPLANT
WRAP KNEE MAXI GEL POST OP (GAUZE/BANDAGES/DRESSINGS) ×2 IMPLANT

## 2020-12-19 NOTE — Transfer of Care (Signed)
Immediate Anesthesia Transfer of Care Note  Patient: Janne Lab  Procedure(s) Performed: TOTAL KNEE ARTHROPLASTY (Left Knee)  Patient Location: PACU  Anesthesia Type:Spinal  Level of Consciousness: awake, oriented, sedated and patient cooperative  Airway & Oxygen Therapy: Patient Spontanous Breathing and Patient connected to face mask oxygen  Post-op Assessment: Report given to RN and Post -op Vital signs reviewed and stable  Post vital signs: stable  Last Vitals:  Vitals Value Taken Time  BP 117/67 12/19/20 1430  Temp    Pulse 57 12/19/20 1432  Resp 27 12/19/20 1432  SpO2 98 % 12/19/20 1432  Vitals shown include unvalidated device data.  Last Pain:  Vitals:   12/19/20 1036  TempSrc: Oral  PainSc: 4       Patients Stated Pain Goal: 4 (07/20/24 3664)  Complications: No complications documented.

## 2020-12-19 NOTE — Anesthesia Procedure Notes (Signed)
Spinal  Patient location during procedure: OR End time: 12/19/2020 12:59 PM Reason for block: surgical anesthesia Staffing Performed: resident/CRNA  Resident/CRNA: Lissa Morales, CRNA Preanesthetic Checklist Completed: patient identified, IV checked, site marked, risks and benefits discussed, surgical consent, monitors and equipment checked, pre-op evaluation and timeout performed Spinal Block Patient position: sitting Prep: DuraPrep Patient monitoring: heart rate, continuous pulse ox and blood pressure Approach: midline Location: L4-5 Injection technique: single-shot Needle Needle type: Pencan  Needle gauge: 24 G Needle length: 9 cm Assessment Events: CSF return Additional Notes Expiration date of kit checked and confirmed. Patient tolerated procedure well, without complications.

## 2020-12-19 NOTE — Evaluation (Signed)
Physical Therapy Evaluation Patient Details Name: Cassandra [name] MRN: [id number] DOB: 17-Jan-1946 Today's Date: 12/19/2020   History of Present Illness  Pt is 75 yo female s/p L TKA on 12/19/20.  PMH includes R breast CA with surgery in 2006, obesity, hypothyroidism, HTN, PE.  Clinical Impression  Pt is s/p TKA resulting in the deficits listed below (see PT Problem List). Pt did well on DOS.  She is very motivated and expected to progress well with therapy.  She had good quad activation, ROM, and pain control.  Pt ambulated 16' with RW min guard and cues.  Pt lives alone but plans to stay with a friend for a few weeks.  Her friend lives in a split level and pt will have 3 steps to enter and 4 to get to her bed/bath.   Pt will benefit from skilled PT to increase their independence and safety with mobility to allow discharge to the venue listed below.        Follow Up Recommendations Follow surgeon's recommendation for DC plan and follow-up therapies;Supervision for mobility/OOB    Equipment Recommendations  Rolling walker with 5" wheels (possible youth RW)    Recommendations for Other Services       Precautions / Restrictions Precautions Precautions: Fall Restrictions Weight Bearing Restrictions: Yes LLE Weight Bearing: Weight bearing as tolerated      Mobility  Bed Mobility Overal bed mobility: Needs Assistance Bed Mobility: Supine to Sit     Supine to sit: Supervision          Transfers Overall transfer level: Needs assistance Equipment used: Rolling walker (2 wheeled) Transfers: Sit to/from Stand Sit to Stand: Min guard         General transfer comment: cues for hand placement  Ambulation/Gait Ambulation/Gait assistance: Min guard Gait Distance (Feet): 75 Feet Assistive device: Rolling walker (2 wheeled) Gait Pattern/deviations: Step-to pattern;Decreased stride length;Decreased stance time - left Gait velocity: decreased   General Gait Details: cues for  sequencing and RW proximity  Stairs            Wheelchair Mobility    Modified Rankin (Stroke Patients Only)       Balance Overall balance assessment: Needs assistance Sitting-balance support: No upper extremity supported Sitting balance-Leahy Scale: Good     Standing balance support: Bilateral upper extremity supported Standing balance-Leahy Scale: Fair Standing balance comment: RW used for WBAT; pt steady with static stand                             Pertinent Vitals/Pain Pain Assessment: 0-10 Pain Score: 6  Pain Location: L knee Pain Descriptors / Indicators: Discomfort;Sore Pain Intervention(s): Limited activity within patient's tolerance;Monitored during session;Premedicated before session;Repositioned;Ice applied    Home Living Family/patient expects to be discharged to:: Private residence Living Arrangements: Alone Available Help at Discharge: Friend(s);Available 24 hours/day (staying with friend for a week) Type of Home: House Home Access: Stairs to enter Entrance Stairs-Rails: Right;Left;Can reach both Entrance Stairs-Number of Steps: 3 Home Layout: Other (Comment) (split level with bed/bath on same floor) Home Equipment: Shower seat;Grab bars - tub/shower;Cane - single point Additional Comments: Information above is on friend's house    Prior Function Level of Independence: Independent               Hand Dominance        Extremity/Trunk Assessment   Upper Extremity Assessment Upper Extremity Assessment: Overall WFL for tasks assessed  Lower Extremity Assessment Lower Extremity Assessment: LLE deficits/detail;RLE deficits/detail RLE Deficits / Details: WFL; does report painful and also needs TKA in future RLE Sensation: WNL LLE Deficits / Details: expected post op changes; ROM: ankle and hip WFL, knee 10-85 degrees; MMT: ankle 5/5, knee and hip 3/5 not further tested LLE Sensation: WNL    Cervical / Trunk  Assessment Cervical / Trunk Assessment: Normal  Communication   Communication: No difficulties  Cognition Arousal/Alertness: Awake/alert Behavior During Therapy: WFL for tasks assessed/performed Overall Cognitive Status: Within Functional Limits for tasks assessed                                        General Comments General comments (skin integrity, edema, etc.): Educated on resting with leg straight and HEP of ankle pumps, quad sets, and heel slides as able for tonight    Exercises Total Joint Exercises Ankle Circles/Pumps: AROM;Both;10 reps;Supine Quad Sets: AROM;Both;5 reps;Supine Heel Slides: AAROM;Left;5 reps;Supine   Assessment/Plan    PT Assessment Patient needs continued PT services  PT Problem List Decreased strength;Decreased mobility;Decreased range of motion;Decreased knowledge of precautions;Decreased activity tolerance;Decreased balance;Decreased knowledge of use of DME;Pain       PT Treatment Interventions DME instruction;Therapeutic activities;Modalities;Gait training;Therapeutic exercise;Patient/family education;Stair training;Functional mobility training;Balance training    PT Goals (Current goals can be found in the Care Plan section)  Acute Rehab PT Goals Patient Stated Goal: return home PT Goal Formulation: With patient Time For Goal Achievement: 01/02/21 Potential to Achieve Goals: Good    Frequency 7X/week   Barriers to discharge        Co-evaluation               AM-PAC PT "6 Clicks" Mobility  Outcome Measure Help needed turning from your back to your side while in a flat bed without using bedrails?: A Little Help needed moving from lying on your back to sitting on the side of a flat bed without using bedrails?: A Little Help needed moving to and from a bed to a chair (including a wheelchair)?: A Little Help needed standing up from a chair using your arms (e.g., wheelchair or bedside chair)?: A Little Help needed to  walk in hospital room?: A Little Help needed climbing 3-5 steps with a railing? : A Little 6 Click Score: 18    End of Session Equipment Utilized During Treatment: Gait belt Activity Tolerance: Patient tolerated treatment well Patient left: with chair alarm set;in chair;with call bell/phone within reach;with family/visitor present Nurse Communication: Mobility status PT Visit Diagnosis: Other abnormalities of gait and mobility (R26.89);Muscle weakness (generalized) (M62.81)    Time: 1710-1745 PT Time Calculation (min) (ACUTE ONLY): 35 min   Charges:   PT Evaluation $PT Eval Low Complexity: 1 Low PT Treatments $Gait Training: 8-22 mins        Abran Richard, PT Acute Rehab Services Pager [phone] [facility] [place] 12/19/2020, 6:04 PM

## 2020-12-19 NOTE — Interval H&P Note (Signed)
History and Physical Interval Note:  12/19/2020 10:39 AM  Janne Lab  has presented today for surgery, with the diagnosis of left knee osteoarthritis.  The various methods of treatment have been discussed with the patient and family. After consideration of risks, benefits and other options for treatment, the patient has consented to  Procedure(s) with comments: TOTAL KNEE ARTHROPLASTY (Left) - 39min as a surgical intervention.  The patient's history has been reviewed, patient examined, no change in status, stable for surgery.  I have reviewed the patient's chart and labs.  Questions were answered to the patient's satisfaction.     Pilar Plate Maliya Marich

## 2020-12-19 NOTE — Op Note (Signed)
OPERATIVE REPORT-TOTAL KNEE ARTHROPLASTY   Pre-operative diagnosis- Osteoarthritis  Left knee(s)  Post-operative diagnosis- Osteoarthritis Left knee(s)  Procedure-  Left  Total Knee Arthroplasty  Surgeon- Cassandra Plover. Katheren Jimmerson, MD  Assistant- Molli Barrows, PA-C   Anesthesia-  Adductor canal block and spinal  EBL-25 ml   Drains None  Tourniquet time-  Total Tourniquet Time Documented: Thigh (Left) - 35 minutes Total: Thigh (Left) - 35 minutes     Complications- None  Condition-PACU - hemodynamically stable.   Brief Clinical Note  Cassandra [name] is a 75 y.o. year old female with end stage OA of her left knee with progressively worsening pain and dysfunction. She has constant pain, with activity and at rest and significant functional deficits with difficulties even with ADLs. She has had extensive non-op management including analgesics, injections of cortisone and viscosupplements, and home exercise program, but remains in significant pain with significant dysfunction. Radiographs show bone on bone arthritis medial and patellofemoral. She presents now for left Total Knee Arthroplasty.    Procedure in detail---   The patient is brought into the operating room and positioned supine on the operating table. After successful administration of  Adductor canal block and spinal,   a tourniquet is placed high on the  Left thigh(s) and the lower extremity is prepped and draped in the usual sterile fashion. Time out is performed by the operating team and then the  Left lower extremity is wrapped in Esmarch, knee flexed and the tourniquet inflated to 300 mmHg.       A midline incision is made with a ten blade through the subcutaneous tissue to the level of the extensor mechanism. A fresh blade is used to make a medial parapatellar arthrotomy. Soft tissue over the proximal medial tibia is subperiosteally elevated to the joint line with a knife and into the semimembranosus bursa with a Cobb  elevator. Soft tissue over the proximal lateral tibia is elevated with attention being paid to avoiding the patellar tendon on the tibial tubercle. The patella is everted, knee flexed 90 degrees and the ACL and PCL are removed. Findings are bone on bone medial and patellofemoral with large global osteophytes.        The drill is used to create a starting hole in the distal femur and the canal is thoroughly irrigated with sterile saline to remove the fatty contents. The 5 degree Left  valgus alignment guide is placed into the femoral canal and the distal femoral cutting block is pinned to remove 9 mm off the distal femur. Resection is made with an oscillating saw.      The tibia is subluxed forward and the menisci are removed. The extramedullary alignment guide is placed referencing proximally at the medial aspect of the tibial tubercle and distally along the second metatarsal axis and tibial crest. The block is pinned to remove 63mm off the more deficient medial  side. Resection is made with an oscillating saw. Size 3is the most appropriate size for the tibia and the proximal tibia is prepared with the modular drill and keel punch for that size.      The femoral sizing guide is placed and size 4 is most appropriate. Rotation is marked off the epicondylar axis and confirmed by creating a rectangular flexion gap at 90 degrees. The size 4 cutting block is pinned in this rotation and the anterior, posterior and chamfer cuts are made with the oscillating saw. The intercondylar block is then placed and that cut is made.  Trial size 3 tibial component, trial size 4 posterior stabilized femur and a 10  mm posterior stabilized rotating platform insert trial is placed. Full extension is achieved with excellent varus/valgus and anterior/posterior balance throughout full range of motion. The patella is everted and thickness measured to be 21  mm. Free hand resection is taken to 12 mm, a 38 template is placed, lug holes  are drilled, trial patella is placed, and it tracks normally. Osteophytes are removed off the posterior femur with the trial in place. All trials are removed and the cut bone surfaces prepared with pulsatile lavage. Cement is mixed and once ready for implantation, the size 3 tibial implant, size  4 posterior stabilized femoral component, and the size 38 patella are cemented in place and the patella is held with the clamp. The trial insert is placed and the knee held in full extension. The Exparel (20 ml mixed with 60 ml saline) is injected into the extensor mechanism, posterior capsule, medial and lateral gutters and subcutaneous tissues.  All extruded cement is removed and once the cement is hard the permanent 10 mm posterior stabilized rotating platform insert is placed into the tibial tray.      The wound is copiously irrigated with saline solution and the extensor mechanism closed with # 0 Stratofix suture. The tourniquet is released for a total tourniquet time of 35  minutes. Flexion against gravity is 140 degrees and the patella tracks normally. Subcutaneous tissue is closed with 2.0 vicryl and subcuticular with running 4.0 Monocryl. The incision is cleaned and dried and steri-strips and a bulky sterile dressing are applied. The limb is placed into a knee immobilizer and the patient is awakened and transported to recovery in stable condition.      Please note that a surgical assistant was a medical necessity for this procedure in order to perform it in a safe and expeditious manner. Surgical assistant was necessary to retract the ligaments and vital neurovascular structures to prevent injury to them and also necessary for proper positioning of the limb to allow for anatomic placement of the prosthesis.   Cassandra Plover Cassandra Tenpas, MD    12/19/2020, 2:01 PM

## 2020-12-19 NOTE — Progress Notes (Signed)
AssistedDr. Foster with left, ultrasound guided, adductor canal block. Side rails up, monitors on throughout procedure. See vital signs in flow sheet. Tolerated Procedure well.  

## 2020-12-19 NOTE — Anesthesia Preprocedure Evaluation (Addendum)
Anesthesia Evaluation  Patient identified by MRN, date of birth, ID band Patient awake    Reviewed: Allergy & Precautions, NPO status , Patient's Chart, lab work & pertinent test results, reviewed documented beta blocker date and time   Airway Mallampati: II  TM Distance: >3 FB Neck ROM: Full    Dental no notable dental hx. (+) Teeth Intact, Caps, Dental Advisory Given   Pulmonary shortness of breath and with exertion, sleep apnea and Continuous Positive Airway Pressure Ventilation , PE Hx/o PTE 09/2018   Pulmonary exam normal breath sounds clear to auscultation       Cardiovascular hypertension, Pt. on medications and Pt. on home beta blockers + Peripheral Vascular Disease and + DVT  Normal cardiovascular exam Rhythm:Regular Rate:Normal  EKG 10/25/20 SB, RsR'  Echo 06/08/19 1. Left ventricular ejection fraction, by visual estimation, is 65 to 70%. The left ventricle has hyperdynamic function. There is no left ventricular hypertrophy.  2. Left ventricular diastolic Doppler parameters are indeterminate pattern of LV diastolic filling.  3. Global right ventricle has normal systolic function.The right ventricular size is normal. No increase in right ventricular wall thickness.  4. The average left ventricular global longitudinal strain is -20.2 %.    Neuro/Psych negative neurological ROS  negative psych ROS   GI/Hepatic negative GI ROS, Neg liver ROS,   Endo/Other  Hypothyroidism Morbid obesityHx/o Breast Ca S/P lumpectomy, AND, ChemoRx and RT Hyperlipidemia  Renal/GU negative Renal ROS  negative genitourinary   Musculoskeletal  (+) Arthritis , Osteoarthritis,  OA left knee   Abdominal (+) + obese,   Peds  Hematology Eliquis Therapy- last dose 3 days ago   Anesthesia Other Findings   Reproductive/Obstetrics                            Anesthesia Physical Anesthesia Plan  ASA: III  Anesthesia  Plan: Spinal   Post-op Pain Management:  Regional for Post-op pain   Induction: Intravenous  PONV Risk Score and Plan: 3 and Treatment may vary due to age or medical condition, Propofol infusion, Ondansetron and Dexamethasone  Airway Management Planned: Natural Airway and Simple Face Mask  Additional Equipment: None  Intra-op Plan:   Post-operative Plan:   Informed Consent: I have reviewed the patients History and Physical, chart, labs and discussed the procedure including the risks, benefits and alternatives for the proposed anesthesia with the patient or authorized representative who has indicated his/her understanding and acceptance.     Dental advisory given  Plan Discussed with: CRNA and Anesthesiologist  Anesthesia Plan Comments:         Anesthesia Quick Evaluation

## 2020-12-19 NOTE — Anesthesia Procedure Notes (Signed)
Procedure Name: MAC Date/Time: 12/19/2020 12:52 PM Performed by: Lissa Morales, CRNA Pre-anesthesia Checklist: Patient identified, Emergency Drugs available, Suction available, Patient being monitored and Timeout performed Patient Re-evaluated:Patient Re-evaluated prior to induction Oxygen Delivery Method: Simple face mask Placement Confirmation: positive ETCO2

## 2020-12-19 NOTE — Care Plan (Signed)
Ortho Bundle Case Management Note  Patient Details  Name: Cassandra [name] MRN: [id number] Date of Birth: 11/18/1945  L TKA on 12-19-20 DCP:  Home with neighbor.  1 story home with 4 ste. DME:  RW and 3-in-1 ordered through [place] PT:  [place].  PT eval scheduled on 12-22-20 at 2:00 pm.                    DME Arranged:  Gilford Rile rolling,3-N-1 DME Agency:  [employer]  HH Arranged:  NA HH Agency:  NA  Additional Comments: Please contact me with any questions of if this plan should need to change.  Marianne Sofia, RN,CCM [facility]  [phone] 12/19/2020, 3:42 PM

## 2020-12-19 NOTE — Discharge Instructions (Signed)
 Frank Aluisio, MD Total Joint Specialist [facility] [address] [place], [place] [address] [phone]  TOTAL KNEE REPLACEMENT POSTOPERATIVE DIRECTIONS    Knee Rehabilitation, Guidelines Following Surgery  Results after knee surgery are often greatly improved when you follow the exercise, range of motion and muscle strengthening exercises prescribed by your doctor. Safety measures are also important to protect the knee from further injury. If any of these exercises cause you to have increased pain or swelling in your knee joint, decrease the amount until you are comfortable again and slowly increase them. If you have problems or questions, call your caregiver or physical therapist for advice.   HOME CARE INSTRUCTIONS  . Remove items at home which could result in a fall. This includes throw rugs or furniture in walking pathways.  . ICE to the affected knee as much as tolerated. Icing helps control swelling. If the swelling is well controlled you will be more comfortable and rehab easier. Continue to use ice on the knee for pain and swelling from surgery. You may notice swelling that will progress down to the foot and ankle. This is normal after surgery. Elevate the leg when you are not up walking on it.    . Continue to use the breathing machine which will help keep your temperature down. It is common for your temperature to cycle up and down following surgery, especially at night when you are not up moving around and exerting yourself. The breathing machine keeps your lungs expanded and your temperature down. . Do not place pillow under the operative knee, focus on keeping the knee straight while resting  DIET You may resume your previous home diet once you are discharged from the hospital.  DRESSING / WOUND CARE / SHOWERING . Keep your bulky bandage on for 2 days. On the third post-operative day you may remove the Ace bandage and gauze. There is a  waterproof adhesive bandage on your skin which will stay in place until your first follow-up appointment. Once you remove this you will not need to place another bandage . You may begin showering 3 days following surgery, but do not submerge the incision under water.  ACTIVITY For the first 5 days, the key is rest and control of pain and swelling . Do your home exercises twice a day starting on post-operative day 3. On the days you go to physical therapy, just do the home exercises once that day. . You should rest, ice and elevate the leg for 50 minutes out of every hour. Get up and walk/stretch for 10 minutes per hour. After 5 days you can increase your activity slowly as tolerated. . Walk with your walker as instructed. Use the walker until you are comfortable transitioning to a cane. Walk with the cane in the opposite hand of the operative leg. You may discontinue the cane once you are comfortable and walking steadily. . Avoid periods of inactivity such as sitting longer than an hour when not asleep. This helps prevent blood clots.  . You may discontinue the knee immobilizer once you are able to perform a straight leg raise while lying down. . You may resume a sexual relationship in one month or when given the OK by your doctor.  . You may return to work once you are cleared by your doctor.  . Do not drive a car for 6 weeks or until released by your surgeon.  . Do not drive while taking narcotics.  TED HOSE   STOCKINGS Wear the elastic stockings on both legs for three weeks following surgery during the day. You may remove them at night for sleeping.  WEIGHT BEARING Weight bearing as tolerated with assist device (walker, cane, etc) as directed, use it as long as suggested by your surgeon or therapist, typically at least 4-6 weeks.  POSTOPERATIVE CONSTIPATION PROTOCOL Constipation - defined medically as fewer than three stools per week and severe constipation as less than one stool per  week.  One of the most common issues patients have following surgery is constipation.  Even if you have a regular bowel pattern at home, your normal regimen is likely to be disrupted due to multiple reasons following surgery.  Combination of anesthesia, postoperative narcotics, change in appetite and fluid intake all can affect your bowels.  In order to avoid complications following surgery, here are some recommendations in order to help you during your recovery period.  . Colace (docusate) - Pick up an over-the-counter form of Colace or another stool softener and take twice a day as long as you are requiring postoperative pain medications.  Take with a full glass of water daily.  If you experience loose stools or diarrhea, hold the colace until you stool forms back up. If your symptoms do not get better within 1 week or if they get worse, check with your doctor. . Dulcolax (bisacodyl) - Pick up over-the-counter and take as directed by the product packaging as needed to assist with the movement of your bowels.  Take with a full glass of water.  Use this product as needed if not relieved by Colace only.  . MiraLax (polyethylene glycol) - Pick up over-the-counter to have on hand. MiraLax is a solution that will increase the amount of water in your bowels to assist with bowel movements.  Take as directed and can mix with a glass of water, juice, soda, coffee, or tea. Take if you go more than two days without a movement. Do not use MiraLax more than once per day. Call your doctor if you are still constipated or irregular after using this medication for 7 days in a row.  If you continue to have problems with postoperative constipation, please contact the office for further assistance and recommendations.  If you experience "the worst abdominal pain ever" or develop nausea or vomiting, please contact the office immediatly for further recommendations for treatment.  ITCHING If you experience itching with your  medications, try taking only a single pain pill, or even half a pain pill at a time.  You can also use Benadryl over the counter for itching or also to help with sleep.   MEDICATIONS See your medication summary on the "After Visit Summary" that the nursing staff will review with you prior to discharge.  You may have some home medications which will be placed on hold until you complete the course of blood thinner medication.  It is important for you to complete the blood thinner medication as prescribed by your surgeon.  Continue your approved medications as instructed at time of discharge.  PRECAUTIONS . If you experience chest pain or shortness of breath - call 911 immediately for transfer to the hospital emergency department.  . If you develop a fever greater that 101 F, purulent drainage from wound, increased redness or drainage from wound, foul odor from the wound/dressing, or calf pain - CONTACT YOUR SURGEON.                                                     FOLLOW-UP APPOINTMENTS Make sure you keep all of your appointments after your operation with your surgeon and caregivers. You should call the office at the above phone number and make an appointment for approximately two weeks after the date of your surgery or on the date instructed by your surgeon outlined in the "After Visit Summary".  RANGE OF MOTION AND STRENGTHENING EXERCISES  Rehabilitation of the knee is important following a knee injury or an operation. After just a few days of immobilization, the muscles of the thigh which control the knee become weakened and shrink (atrophy). Knee exercises are designed to build up the tone and strength of the thigh muscles and to improve knee motion. Often times heat used for twenty to thirty minutes before working out will loosen up your tissues and help with improving the range of motion but do not use heat for the first two weeks following surgery. These exercises can be done on a training  (exercise) mat, on the floor, on a table or on a bed. Use what ever works the best and is most comfortable for you Knee exercises include:  . Leg Lifts - While your knee is still immobilized in a splint or cast, you can do straight leg raises. Lift the leg to 60 degrees, hold for 3 sec, and slowly lower the leg. Repeat 10-20 times 2-3 times daily. Perform this exercise against resistance later as your knee gets better.  . Quad and Hamstring Sets - Tighten up the muscle on the front of the thigh (Quad) and hold for 5-10 sec. Repeat this 10-20 times hourly. Hamstring sets are done by pushing the foot backward against an object and holding for 5-10 sec. Repeat as with quad sets.   Leg Slides: Lying on your back, slowly slide your foot toward your buttocks, bending your knee up off the floor (only go as far as is comfortable). Then slowly slide your foot back down until your leg is flat on the floor again.  Angel Wings: Lying on your back spread your legs to the side as far apart as you can without causing discomfort.  A rehabilitation program following serious knee injuries can speed recovery and prevent re-injury in the future due to weakened muscles. Contact your doctor or a physical therapist for more information on knee rehabilitation.   IF YOU ARE TRANSFERRED TO A SKILLED REHAB FACILITY If the patient is transferred to a skilled rehab facility following release from the hospital, a list of the current medications will be sent to the facility for the patient to continue.  When discharged from the skilled rehab facility, please have the facility set up the patient's Home Health Physical Therapy prior to being released. Also, the skilled facility will be responsible for providing the patient with their medications at time of release from the facility to include their pain medication, the muscle relaxants, and their blood thinner medication. If the patient is still at the rehab facility at time of the two  week follow up appointment, the skilled rehab facility will also need to assist the patient in arranging follow up appointment in our office and any transportation needs.  MAKE SURE YOU:  . Understand these instructions.  . Get help right away if you are not doing well or get worse.   DENTAL ANTIBIOTICS:  In most cases prophylactic antibiotics for Dental procdeures after total joint surgery are not necessary.  Exceptions are as follows:  1. History of prior total joint infection  2. Severely immunocompromised (  Organ Transplant, cancer chemotherapy, Rheumatoid biologic meds such as Humera)  3. Poorly controlled diabetes (A1C &gt; 8.0, blood glucose over 200)  If you have one of these conditions, contact your surgeon for an antibiotic prescription, prior to your dental procedure.    Pick up stool softner and laxative for home use following surgery while on pain medications. Do not submerge incision under water. Please use good hand washing techniques while changing dressing each day. May shower starting three days after surgery. Please use a clean towel to pat the incision dry following showers. Continue to use ice for pain and swelling after surgery. Do not use any lotions or creams on the incision until instructed by your surgeon.  

## 2020-12-19 NOTE — Anesthesia Postprocedure Evaluation (Signed)
Anesthesia Post Note  Patient: Cassandra [name]  Procedure(s) Performed: TOTAL KNEE ARTHROPLASTY (Left Knee)     Patient location during evaluation: PACU Anesthesia Type: Spinal Level of consciousness: oriented and awake and alert Pain management: pain level controlled Vital Signs Assessment: post-procedure vital signs reviewed and stable Respiratory status: spontaneous breathing, respiratory function stable and nonlabored ventilation Cardiovascular status: blood pressure returned to baseline and stable Postop Assessment: no headache, no backache, no apparent nausea or vomiting, spinal receding and patient able to bend at knees Anesthetic complications: no   No complications documented.  Last Vitals:  Vitals:   12/19/20 1515 12/19/20 1530  BP: 119/67 123/70  Pulse: (!) 48 (!) 48  Resp: 15 15  Temp:    SpO2: 93% 95%    Last Pain:  Vitals:   12/19/20 1530  TempSrc:   PainSc: 0-No pain    LLE Motor Response: Purposeful movement (12/19/20 1530) LLE Sensation: Decreased;Numbness (12/19/20 1530) RLE Motor Response: Purposeful movement (12/19/20 1530) RLE Sensation: Decreased;Numbness (12/19/20 1530) L Sensory Level: L4-Anterior knee, lower leg (12/19/20 1530) R Sensory Level: L4-Anterior knee, lower leg (12/19/20 1530)  Ivannah Zody A.

## 2020-12-19 NOTE — Progress Notes (Signed)
Orthopedic Tech Progress Note Patient Details:  Cassandra [name] 1946/03/10 [id number]  Patient ID: Cassandra [name], female   DOB: 02/29/1946, 75 y.o.   MRN: [id number]   Cassandra [name] 12/19/2020, 3:09 PM Patient placed in cpm in pacu @1505 

## 2020-12-20 ENCOUNTER — Encounter (HOSPITAL_COMMUNITY): Payer: Self-pay | Admitting: Orthopedic Surgery

## 2020-12-20 DIAGNOSIS — G4733 Obstructive sleep apnea (adult) (pediatric): Secondary | ICD-10-CM | POA: Diagnosis not present

## 2020-12-20 DIAGNOSIS — Z86711 Personal history of pulmonary embolism: Secondary | ICD-10-CM | POA: Diagnosis not present

## 2020-12-20 DIAGNOSIS — M1712 Unilateral primary osteoarthritis, left knee: Secondary | ICD-10-CM | POA: Diagnosis not present

## 2020-12-20 DIAGNOSIS — E039 Hypothyroidism, unspecified: Secondary | ICD-10-CM | POA: Diagnosis not present

## 2020-12-20 DIAGNOSIS — M25561 Pain in right knee: Secondary | ICD-10-CM | POA: Diagnosis not present

## 2020-12-20 DIAGNOSIS — I10 Essential (primary) hypertension: Secondary | ICD-10-CM | POA: Diagnosis not present

## 2020-12-20 DIAGNOSIS — I2724 Chronic thromboembolic pulmonary hypertension: Secondary | ICD-10-CM | POA: Diagnosis not present

## 2020-12-20 DIAGNOSIS — M25562 Pain in left knee: Secondary | ICD-10-CM | POA: Diagnosis not present

## 2020-12-20 DIAGNOSIS — D689 Coagulation defect, unspecified: Secondary | ICD-10-CM | POA: Diagnosis not present

## 2020-12-20 DIAGNOSIS — I739 Peripheral vascular disease, unspecified: Secondary | ICD-10-CM | POA: Diagnosis not present

## 2020-12-20 LAB — BASIC METABOLIC PANEL
Anion gap: 9 (ref 5–15)
BUN: 13 mg/dL (ref 8–23)
CO2: 22 mmol/L (ref 22–32)
Calcium: 8.7 mg/dL — ABNORMAL LOW (ref 8.9–10.3)
Chloride: 106 mmol/L (ref 98–111)
Creatinine, Ser: 0.74 mg/dL (ref 0.44–1.00)
GFR, Estimated: >60 mL/min (ref >60)
Glucose, Bld: 137 mg/dL — ABNORMAL HIGH (ref 70–99)
Potassium: 3.7 mmol/L (ref 3.5–5.1)
Sodium: 137 mmol/L (ref 135–145)

## 2020-12-20 LAB — CBC
HCT: 36.1 % (ref 36.0–46.0)
Hemoglobin: 11.7 g/dL — ABNORMAL LOW (ref 12.0–15.0)
MCH: 31.4 pg (ref 26.0–34.0)
MCHC: 32.4 g/dL (ref 30.0–36.0)
MCV: 96.8 fL (ref 80.0–100.0)
Platelets: 295 10*3/uL (ref 150–400)
RBC: 3.73 MIL/uL — ABNORMAL LOW (ref 3.87–5.11)
RDW: 13.6 % (ref 11.5–15.5)
WBC: 11.1 10*3/uL — ABNORMAL HIGH (ref 4.0–10.5)
nRBC: 0 % (ref 0.0–0.2)

## 2020-12-20 MED ORDER — OXYCODONE HCL 5 MG PO TABS: 5.0000 mg | ORAL_TABLET | Freq: Four times a day (QID) | ORAL | 0 refills | Status: DC | PRN

## 2020-12-20 MED ORDER — GABAPENTIN 300 MG PO CAPS: ORAL_CAPSULE | ORAL | 0 refills | Status: DC

## 2020-12-20 MED ORDER — METHOCARBAMOL 500 MG PO TABS: 500.0000 mg | ORAL_TABLET | Freq: Four times a day (QID) | ORAL | 0 refills | Status: DC | PRN

## 2020-12-20 MED ORDER — TRAMADOL HCL 50 MG PO TABS: 50.0000 mg | ORAL_TABLET | Freq: Four times a day (QID) | ORAL | 0 refills | Status: DC | PRN

## 2020-12-20 NOTE — TOC Transition Note (Signed)
Transition of Care [facility]) - CM/SW Discharge Note   Patient Details  Name: Cassandra [name] MRN: [id number] Date of Birth: June 15, 1946  Transition of Care [facility]) CM/SW Contact:  Lennart Pall, LCSW Phone Number: 12/20/2020, 10:14 AM   Clinical Narrative:    Met briefly with pt and confirming need for rw and 3n1 - [employer] to supply.  Plans for OPPT at [place] [facility].  No further TOC needs.   Final next level of care: OP Rehab Barriers to Discharge: No Barriers Identified   Patient Goals and CMS Choice Patient states their goals for this hospitalization and ongoing recovery are:: return home      Discharge Placement                       Discharge Plan and Services                DME Arranged: Gilford Rile rolling,3-N-1 DME Agency: [employer]       HH Arranged: NA HH Agency: NA        Social Determinants of Health (SDOH) Interventions     Readmission Risk Interventions No flowsheet data found.

## 2020-12-20 NOTE — Progress Notes (Signed)
   Subjective: 1 Day Post-Op Procedure(s) (LRB): TOTAL KNEE ARTHROPLASTY (Left) Patient reports pain as mild.   Patient seen in rounds by Dr. Wynelle Link. Patient is well, and has had no acute complaints or problems. No issues overnight. Denies chest pain, SOB, or calf pain. Foley catheter to be removed this AM. We will continue therapy today, ambulated 21' yesterday.  Objective: Vital signs in last 24 hours: Temp:  [96.9 F (36.1 C)-98.4 F (36.9 C)] 98 F (36.7 C) (03/29 0535) Pulse Rate:  [48-74] 61 (03/29 0535) Resp:  [11-18] 16 (03/29 0535) BP: (101-139)/(54-108) 139/108 (03/29 0535) SpO2:  [90 %-100 %] 92 % (03/29 0535) Weight:  [90.7 kg] 90.7 kg (03/28 1036)  Intake/Output from previous day:  Intake/Output Summary (Last 24 hours) at 12/20/2020 0708 Last data filed at 12/20/2020 6160 Gross per 24 hour  Intake 3219.28 ml  Output 3150 ml  Net 69.28 ml     Intake/Output this shift: No intake/output data recorded.  Labs: Recent Labs    12/20/20 0308  HGB 11.7*   Recent Labs    12/20/20 0308  WBC 11.1*  RBC 3.73*  HCT 36.1  PLT 295   Recent Labs    12/20/20 0308  NA 137  K 3.7  CL 106  CO2 22  BUN 13  CREATININE 0.74  GLUCOSE 137*  CALCIUM 8.7*   No results for input(s): LABPT, INR in the last 72 hours.  Exam: General - Patient is Alert and Oriented Extremity - Neurologically intact Neurovascular intact Sensation intact distally Dorsiflexion/Plantar flexion intact Dressing - dressing C/D/I Motor Function - intact, moving foot and toes well on exam.   Past Medical History:  Diagnosis Date  . Arthritis   . Cancer [facility])    Breast cancer right '06- surgery, chemo, radiation- released after 5 yrs  . Dyspnea   . Hx of seasonal allergies    OTC med used  . Hypertension   . Hypothyroidism     Assessment/Plan: 1 Day Post-Op Procedure(s) (LRB): TOTAL KNEE ARTHROPLASTY (Left) Principal Problem:   OA (osteoarthritis) of knee Active Problems:    Primary osteoarthritis of left knee  Estimated body mass index is 39.06 kg/m as calculated from the following:   Height as of this encounter: 5' (1.524 m).   Weight as of this encounter: 90.7 kg. Advance diet Up with therapy D/C IV fluids   Patient's anticipated LOS is less than 2 midnights, meeting these requirements: - Younger than 24 - Lives within 1 hour of care - Has a competent adult at home to recover with post-op recover - NO history of  - Chronic pain requiring opiods  - Diabetes  - Coronary Artery Disease  - Heart failure  - Heart attack  - Stroke  - DVT/VTE  - Cardiac arrhythmia  - Respiratory Failure/COPD  - Renal failure  - Anemia  - Advanced Liver disease  DVT Prophylaxis - Eliquis Weight bearing as tolerated. Continue therapy.  Plan is to go Home after hospital stay. Plan for discharge later today if progresses with therapy and meeting her goals. Scheduled for OPP[facility] at [facility]). Follow-up in the office in 2 weeks  The PDMP database was reviewed today prior to any opioid medications being prescribed to this patient.  Theresa Duty, PA-C Orthopedic Surgery [phone] 12/20/2020, 7:08 AM

## 2020-12-20 NOTE — Progress Notes (Signed)
Physical Therapy Treatment Patient Details Name: Cassandra [name] MRN: [id number] DOB: 09-Jan-1946 Today's Date: 12/20/2020    History of Present Illness Pt is 75 yo female s/p L TKA on 12/19/20.  PMH includes R breast CA with surgery in 2006, obesity, hypothyroidism, HTN, PE.    PT Comments    Patient is progressing well. Good SLR. Plans Dc home, staying with a friend. Patient practiced steps and exercises.  Follow Up Recommendations  Follow surgeon's recommendation for DC plan and follow-up therapies;Supervision for mobility/OOB     Equipment Recommendations  Rolling walker with 5" wheels    Recommendations for Other Services       Precautions / Restrictions Precautions Precautions: Fall;Knee Restrictions Weight Bearing Restrictions: No LLE Weight Bearing: Weight bearing as tolerated    Mobility  Bed Mobility Overal bed mobility: Modified Independent                  Transfers   Equipment used: Rolling walker (2 wheeled) Transfers: Sit to/from Stand Sit to Stand: Supervision         General transfer comment: cues for hand placement  Ambulation/Gait Ambulation/Gait assistance: Min guard Gait Distance (Feet): 75 Feet Assistive device: Rolling walker (2 wheeled) Gait Pattern/deviations: Step-to pattern;Step-through pattern Gait velocity: decreased   General Gait Details: cues for sequencing and RW proximity, roll vs. pick up   Stairs Stairs: Yes Stairs assistance: Min assist Stair Management: Two rails;Forwards   General stair comments: 3 steps with 2 rails, 2 steps with right rail and cane   Wheelchair Mobility    Modified Rankin (Stroke Patients Only)       Balance Overall balance assessment: Needs assistance Sitting-balance support: No upper extremity supported Sitting balance-Leahy Scale: Good     Standing balance support: During functional activity;Bilateral upper extremity supported Standing balance-Leahy Scale: Fair                               Cognition Arousal/Alertness: Awake/alert                                            Exercises Total Joint Exercises Ankle Circles/Pumps: AROM;Both;10 reps;Supine Quad Sets: AROM;Both;5 reps;Supine Heel Slides: AAROM;Left;Supine;10 reps Hip ABduction/ADduction: AROM;10 reps;Supine Straight Leg Raises: AROM;Left;10 reps Long Arc Quad: AROM;Left;Seated Knee Flexion: AROM;Left;10 reps;Seated Goniometric ROM: 10-70 knee flex on left    General Comments        Pertinent Vitals/Pain Pain Score: 2  Pain Location: L knee Pain Descriptors / Indicators: Discomfort;Sore Pain Intervention(s): Monitored during session;Premedicated before session;Ice applied    Home Living                      Prior Function            PT Goals (current goals can now be found in the care plan section) Progress towards PT goals: Progressing toward goals    Frequency    7X/week      PT Plan Current plan remains appropriate    Co-evaluation              AM-PAC PT "6 Clicks" Mobility   Outcome Measure  Help needed turning from your back to your side while in a flat bed without using bedrails?: None Help needed moving from lying on your back to sitting on the  side of a flat bed without using bedrails?: None Help needed moving to and from a bed to a chair (including a wheelchair)?: A Little Help needed standing up from a chair using your arms (e.g., wheelchair or bedside chair)?: A Little Help needed to walk in hospital room?: A Little Help needed climbing 3-5 steps with a railing? : A Little 6 Click Score: 20    End of Session Equipment Utilized During Treatment: Gait belt Activity Tolerance: Patient tolerated treatment well Patient left: in chair;with call bell/phone within reach Nurse Communication: Mobility status PT Visit Diagnosis: Other abnormalities of gait and mobility (R26.89);Muscle weakness (generalized) (M62.81)      Time: 2449-7530 PT Time Calculation (min) (ACUTE ONLY): 30 min  Charges:  $Gait Training: 8-22 mins $Therapeutic Exercise: 8-22 mins                     Tresa Endo PT Acute Rehabilitation Services Pager [phone] Office [phone]    Claretha Cooper 12/20/2020, 10:50 AM

## 2020-12-20 NOTE — Plan of Care (Signed)
Plan of care discussed with pt.

## 2020-12-21 NOTE — Discharge Summary (Signed)
Physician Discharge Summary   Patient ID: Cassandra [name] MRN: [id number] DOB/AGE: 1946/06/04 75 y.o.  Admit date: 12/19/2020 Discharge date: 12/20/2020  Primary Diagnosis: Osteoarthritis, left knee   Admission Diagnoses:  Past Medical History:  Diagnosis Date  . Arthritis   . Cancer [facility])    Breast cancer right '06- surgery, chemo, radiation- released after 5 yrs  . Dyspnea   . Hx of seasonal allergies    OTC med used  . Hypertension   . Hypothyroidism    Discharge Diagnoses:   Principal Problem:   OA (osteoarthritis) of knee Active Problems:   Primary osteoarthritis of left knee  Estimated body mass index is 39.06 kg/m as calculated from the following:   Height as of this encounter: 5' (1.524 m).   Weight as of this encounter: 90.7 kg.  Procedure:  Procedure(s) (LRB): TOTAL KNEE ARTHROPLASTY (Left)   Consults: None  HPI: Cassandra [name] is a 75 y.o. year old female with end stage OA of her left knee with progressively worsening pain and dysfunction. She has constant pain, with activity and at rest and significant functional deficits with difficulties even with ADLs. She has had extensive non-op management including analgesics, injections of cortisone and viscosupplements, and home exercise program, but remains in significant pain with significant dysfunction. Radiographs show bone on bone arthritis medial and patellofemoral. She presents now for left Total Knee Arthroplasty.    Laboratory Data: Admission on 12/19/2020, Discharged on 12/20/2020  Component Date Value Ref Range Status  . WBC 12/20/2020 11.1* 4.0 - 10.5 K/uL Final  . RBC 12/20/2020 3.73* 3.87 - 5.11 MIL/uL Final  . Hemoglobin 12/20/2020 11.7* 12.0 - 15.0 g/dL Final  . HCT 12/20/2020 36.1  36.0 - 46.0 % Final  . MCV 12/20/2020 96.8  80.0 - 100.0 fL Final  . MCH 12/20/2020 31.4  26.0 - 34.0 pg Final  . MCHC 12/20/2020 32.4  30.0 - 36.0 g/dL Final  . RDW 12/20/2020 13.6  11.5 - 15.5 % Final  . Platelets  12/20/2020 295  150 - 400 K/uL Final  . nRBC 12/20/2020 0.0  0.0 - 0.2 % Final   Performed at [facility], [place] [address], [place], [place] [address]  . Sodium 12/20/2020 137  135 - 145 mmol/L Final  . Potassium 12/20/2020 3.7  3.5 - 5.1 mmol/L Final  . Chloride 12/20/2020 106  98 - 111 mmol/L Final  . CO2 12/20/2020 22  22 - 32 mmol/L Final  . Glucose, Bld 12/20/2020 137* 70 - 99 mg/dL Final   Glucose reference range applies only to samples taken after fasting for at least 8 hours.  . BUN 12/20/2020 13  8 - 23 mg/dL Final  . Creatinine, Ser 12/20/2020 0.74  0.44 - 1.00 mg/dL Final  . Calcium 12/20/2020 8.7* 8.9 - 10.3 mg/dL Final  . GFR, Estimated 12/20/2020 >60  >60 mL/min Final   Comment: (NOTE) Calculated using the CKD-EPI Creatinine Equation (2021)   . Anion gap 12/20/2020 9  5 - 15 Final   Performed at [facility], [place] [address], [place], [place] [address]  Hospital Outpatient Visit on 12/15/2020  Component Date Value Ref Range Status  . SARS Coronavirus 2 12/15/2020 NEGATIVE  NEGATIVE Final   Comment: (NOTE) SARS-CoV-2 target nucleic acids are NOT DETECTED.  The SARS-CoV-2 RNA is generally detectable in upper and lower respiratory specimens during the acute phase of infection. Negative results do not preclude SARS-CoV-2 infection, do not rule out co-infections with  other pathogens, and should not be used as the sole basis for treatment or other patient management decisions. Negative results must be combined with clinical observations, patient history, and epidemiological information. The expected result is Negative.  Fact Sheet for Patients: SugarRoll.be  Fact Sheet for Healthcare Providers: https://www.woods-mathews.com/  This test is not yet approved or cleared by the [place] FDA and  has been authorized for detection and/or diagnosis of SARS-CoV-2 by FDA under an Emergency Use  Authorization (EUA). This EUA will remain  in effect (meaning this test can be used) for the duration of the COVID-19 declaration under Se                          ction 564(b)(1) of the Act, 21 U.S.C. section 360bbb-3(b)(1), unless the authorization is terminated or revoked sooner.  Performed at [facility], [place] [address], [place], [place] [address]   Hospital Outpatient Visit on 12/15/2020  Component Date Value Ref Range Status  . MRSA, PCR 12/15/2020 NEGATIVE  NEGATIVE Final  . Staphylococcus aureus 12/15/2020 NEGATIVE  NEGATIVE Final   Comment: (NOTE) The Xpert SA Assay (FDA approved for NASAL specimens in patients 70 years of age and older), is one component of a comprehensive surveillance program. It is not intended to diagnose infection nor to guide or monitor treatment. Performed at [facility], [place] [address], [place], [place] [address]   . WBC 12/15/2020 9.2  4.0 - 10.5 K/uL Final  . RBC 12/15/2020 4.28  3.87 - 5.11 MIL/uL Final  . Hemoglobin 12/15/2020 13.6  12.0 - 15.0 g/dL Final  . HCT 12/15/2020 42.4  36.0 - 46.0 % Final  . MCV 12/15/2020 99.1  80.0 - 100.0 fL Final  . MCH 12/15/2020 31.8  26.0 - 34.0 pg Final  . MCHC 12/15/2020 32.1  30.0 - 36.0 g/dL Final  . RDW 12/15/2020 14.1  11.5 - 15.5 % Final  . Platelets 12/15/2020 365  150 - 400 K/uL Final  . nRBC 12/15/2020 0.0  0.0 - 0.2 % Final   Performed at [facility], [place] [address], [place], [place] [address]  . Sodium 12/15/2020 143  135 - 145 mmol/L Final  . Potassium 12/15/2020 4.0  3.5 - 5.1 mmol/L Final  . Chloride 12/15/2020 108  98 - 111 mmol/L Final  . CO2 12/15/2020 25  22 - 32 mmol/L Final  . Glucose, Bld 12/15/2020 123* 70 - 99 mg/dL Final   Glucose reference range applies only to samples taken after fasting for at least 8 hours.  . BUN 12/15/2020 16  8 - 23 mg/dL Final  . Creatinine, Ser 12/15/2020 0.88  0.44 - 1.00 mg/dL Final  . Calcium 12/15/2020  9.7  8.9 - 10.3 mg/dL Final  . Total Protein 12/15/2020 7.4  6.5 - 8.1 g/dL Final  . Albumin 12/15/2020 4.4  3.5 - 5.0 g/dL Final  . AST 12/15/2020 27  15 - 41 U/L Final  . ALT 12/15/2020 21  0 - 44 U/L Final  . Alkaline Phosphatase 12/15/2020 68  38 - 126 U/L Final  . Total Bilirubin 12/15/2020 0.7  0.3 - 1.2 mg/dL Final  . GFR, Estimated 12/15/2020 >60  >60 mL/min Final   Comment: (NOTE) Calculated using the CKD-EPI Creatinine Equation (2021)   . Anion gap 12/15/2020 10  5 - 15 Final   Performed at [facility], [place] [address], [place], [place] [address]  .  Prothrombin Time 12/15/2020 14.6  11.4 - 15.2 seconds Final  . INR 12/15/2020 1.2  0.8 - 1.2 Final   Comment: (NOTE) INR goal varies based on device and disease states. Performed at [facility], [place] [address], [place], [place] [address]   . aPTT 12/15/2020 34  24 - 36 seconds Final   Performed at [facility], [place] [address] [place], [place] [address]  . ABO/RH(D) 12/15/2020 A POS   Final  . Antibody Screen 12/15/2020 NEG   Final  . Sample Expiration 12/15/2020 12/22/2020,2359   Final  . Extend sample reason 12/15/2020    Final                   Value:NO TRANSFUSIONS OR PREGNANCY IN THE PAST 3 MONTHS Performed at [facility], [place] [address] [place], [place] [address]      X-Rays:No results found.  EKG: Orders placed or performed during the hospital encounter of 04/06/19  . EKG 12-Lead  . EKG 12-Lead     Hospital Course: Cassandra [name] is a 75 y.o. who was admitted to [facility]. They were brought to the operating room on 12/19/2020 and underwent Procedure(s): TOTAL KNEE ARTHROPLASTY.  Patient tolerated the procedure well and was later transferred to the recovery room and then to the orthopaedic floor for postoperative care. They were given PO and IV analgesics for pain control following their surgery. They were given 24 hours of  postoperative antibiotics of  Anti-infectives (From admission, onward)   Start     Dose/Rate Route Frequency Ordered Stop   12/19/20 1700  ceFAZolin (ANCEF) IVPB 2g/100 mL premix        2 g 200 mL/hr over 30 Minutes Intravenous Every 6 hours 12/19/20 1602 12/19/20 2252   12/19/20 1030  ceFAZolin (ANCEF) IVPB 2g/100 mL premix        2 g 200 mL/hr over 30 Minutes Intravenous On call to O.R. 12/19/20 1024 12/19/20 1254     and started on DVT prophylaxis in the form of Eliquis.   PT and OT were ordered for total joint protocol. Discharge planning consulted to help with postop disposition and equipment needs.  Patient had a good night on the evening of surgery. They started to get up OOB with therapy on POD #0. Pt was seen during rounds and was ready to go home pending progress with therapy. She worked with therapy on POD #1 and was meeting her goals. Pt was discharged to home later that day in stable condition.  Diet: Cardiac diet Activity: WBAT Follow-up: in 2 weeks Disposition: Home with OPPT Discharged Condition: stable   Discharge Instructions    Call MD / Call 911   Complete by: As directed    If you experience chest pain or shortness of breath, CALL 911 and be transported to the hospital emergency room.  If you develope a fever above 101 F, pus (white drainage) or increased drainage or redness at the wound, or calf pain, call your surgeon's office.   Change dressing   Complete by: As directed    You may remove the bulky bandage (ACE wrap and gauze) two days after surgery. You will have an adhesive waterproof bandage underneath. Leave this in place until your first follow-up appointment.   Constipation Prevention   Complete by: As directed    Drink plenty of fluids.  Prune juice may be helpful.  You may use a stool softener, such as Colace (  over the counter) 100 mg twice a day.  Use MiraLax (over the counter) for constipation as needed.   Diet - low sodium heart healthy   Complete by:  As directed    Do not put a pillow under the knee. Place it under the heel.   Complete by: As directed    Driving restrictions   Complete by: As directed    No driving for two weeks   TED hose   Complete by: As directed    Use stockings (TED hose) for three weeks on both leg(s).  You may remove them at night for sleeping.   Weight bearing as tolerated   Complete by: As directed      Allergies as of 12/20/2020   No Known Allergies     Medication List    TAKE these medications   albuterol 108 (90 Base) MCG/ACT inhaler Commonly known as: VENTOLIN HFA Inhale 2 puffs into the lungs every 4 (four) hours as needed for wheezing or shortness of breath.   apixaban 5 MG Tabs tablet Commonly known as: ELIQUIS Take 5 mg by mouth 2 (two) times daily.   atorvastatin 80 MG tablet Commonly known as: LIPITOR Take 80 mg by mouth every evening.   b complex vitamins capsule Take 1 capsule by mouth daily.   benazepril 40 MG tablet Commonly known as: LOTENSIN Take 40 mg by mouth at bedtime.   bisoprolol-hydrochlorothiazide 10-6.25 MG tablet Commonly known as: ZIAC Take 1 tablet by mouth every morning.   CALCIUM PO Take 1 tablet by mouth 2 (two) times daily.   CO Q 10 PO Take 1 tablet by mouth every morning.   Fish Oil 1000 MG Caps Take 1,000 mg by mouth every morning.   gabapentin 300 MG capsule Commonly known as: NEURONTIN Take a 300 mg capsule three times a day for two weeks following surgery.Then take a 300 mg capsule two times a day for two weeks. Then take a 300 mg capsule once a day for two weeks. Then discontinue. Notes to patient: Used to reduce postop pain   levothyroxine 100 MCG tablet Commonly known as: SYNTHROID Take 100 mcg by mouth daily before breakfast.   loratadine 10 MG tablet Commonly known as: CLARITIN Take 10 mg by mouth every morning.   Lysine 500 MG Caps Take 500 mg by mouth every morning.   methocarbamol 500 MG tablet Commonly known as:  ROBAXIN Take 1 tablet (500 mg total) by mouth every 6 (six) hours as needed for muscle spasms.   multivitamin with minerals Tabs tablet Take 1 tablet by mouth every morning.   NIFEdipine 30 MG 24 hr tablet Commonly known as: ADALAT CC Take 30 mg by mouth daily after breakfast.   oxyCODONE 5 MG immediate release tablet Commonly known as: Oxy IR/ROXICODONE Take 1-2 tablets (5-10 mg total) by mouth every 6 (six) hours as needed for severe pain. Not to exceed 6 tablets a day.   traMADol 50 MG tablet Commonly known as: ULTRAM Take 1-2 tablets (50-100 mg total) by mouth every 6 (six) hours as needed for moderate pain.   TURMERIC PO Take 1 tablet by mouth daily.   vitamin A 25000 UNIT capsule Take 25,000 Units by mouth daily.   Vitamin D3 50 MCG (2000 UT) capsule Take 2,000 Units by mouth every morning.   vitamin E 180 MG (400 UNITS) capsule Take 400 Units by mouth daily.            Discharge Care Instructions  (  From admission, onward)         Start     Ordered   12/20/20 0000  Weight bearing as tolerated        12/20/20 0713   12/20/20 0000  Change dressing       Comments: You may remove the bulky bandage (ACE wrap and gauze) two days after surgery. You will have an adhesive waterproof bandage underneath. Leave this in place until your first follow-up appointment.   12/20/20 9753          Follow-up Information    Gaynelle Arabian, MD. Go on 01/03/2021.   Specialty: Orthopedic Surgery Why: You are scheduled for a post-operative appointment on 01-03-21 at 4:00 pm.  Contact information: [address] [place] [address] [phone]               Signed: Theresa Duty, PA-C Orthopedic Surgery 12/21/2020, 9:30 AM

## 2020-12-22 ENCOUNTER — Ambulatory Visit: Payer: Medicare HMO | Attending: Orthopedic Surgery | Admitting: Physical Therapy

## 2020-12-22 ENCOUNTER — Other Ambulatory Visit: Payer: Self-pay

## 2020-12-22 ENCOUNTER — Encounter: Payer: Self-pay | Admitting: Physical Therapy

## 2020-12-22 DIAGNOSIS — R600 Localized edema: Secondary | ICD-10-CM | POA: Diagnosis not present

## 2020-12-22 DIAGNOSIS — R262 Difficulty in walking, not elsewhere classified: Secondary | ICD-10-CM | POA: Insufficient documentation

## 2020-12-22 DIAGNOSIS — M25662 Stiffness of left knee, not elsewhere classified: Secondary | ICD-10-CM | POA: Diagnosis not present

## 2020-12-22 DIAGNOSIS — M25562 Pain in left knee: Secondary | ICD-10-CM | POA: Insufficient documentation

## 2020-12-22 NOTE — Patient Instructions (Signed)
Access Code: [email or web address] URL: [email or web address] Date: 12/22/2020 Prepared by: Amador Cunas  Exercises Seated Knee Flexion Stretch - 1 x daily - 7 x weekly - 3 sets - 10 reps - 10-15 sec hold Seated Knee Extension Stretch with Chair - 1 x daily - 7 x weekly - 3 sets - 10 reps - 3 sec hold

## 2020-12-22 NOTE — Therapy (Signed)
[place] [place], [place], [address] Phone: [phone]   Fax:  [phone]  Physical Therapy Evaluation  Patient Details  Name: Cassandra [name] MRN: [id number] Date of Birth: 1946-07-22 Referring Provider (PT): Aluisio   Encounter Date: 12/22/2020   PT End of Session - 12/22/20 1648    Visit Number 1    Date for PT Re-Evaluation 03/16/21    PT Start Time 8366    PT Stop Time 1443    PT Time Calculation (min) 41 min    Activity Tolerance Patient tolerated treatment well    Behavior During Therapy [facility] for tasks assessed/performed           Past Medical History:  Diagnosis Date  . Arthritis   . Cancer [facility])    Breast cancer right '06- surgery, chemo, radiation- released after 5 yrs  . Dyspnea   . Hx of seasonal allergies    OTC med used  . Hypertension   . Hypothyroidism     Past Surgical History:  Procedure Laterality Date  . BREAST SURGERY Right    modified partial mastectomy/ lymph nodes dissection  . COLON SURGERY  2/17  . INCISIONAL HERNIA REPAIR N/A 04/06/2016   Procedure: LAPAROSCOPIC ASSISTED INCISIONAL HERNIA REPAIR WITH MESH;  Surgeon: Leighton Ruff, MD;  Location: [facility] ORS;  Service: General;  Laterality: N/A;  . TONSILLECTOMY    . TOTAL KNEE ARTHROPLASTY Left 12/19/2020   Procedure: TOTAL KNEE ARTHROPLASTY;  Surgeon: Gaynelle Arabian, MD;  Location: [facility] ORS;  Service: Orthopedics;  Laterality: Left;  29min    There were no vitals filed for this visit.    Subjective Assessment - 12/22/20 1408    Subjective Pt reports to clinic after L TKA 12/19/20. Pt states she has a split level home with 4 stairs to bedroom with HR and 4 stairs with B HR to enter home. Pt has been dealing with worsening chronic B knee pain for several years. States she plans to get R knee replaced after recovering from L knee. Denies N/T in B feet. States prior to surgery she was walking with no AD. Active working in the  yard and wants to get back to this plus being able to go out on walks.    Pertinent History hx of blood clots (on eliquis), HTN, hypothyroidism    Limitations Standing;Walking    Diagnostic tests xrays    Patient Stated Goals return to PLOF, walk without AD    Currently in Pain? Yes    Pain Score 5     Pain Location Knee    Pain Orientation Left    Pain Descriptors / Indicators Aching;Dull;Sharp    Pain Type Surgical pain    Pain Onset In the past 7 days    Pain Frequency Intermittent    Aggravating Factors  standing, walking, bending knee    Pain Relieving Factors ice, rest, change in position              [facility] PT Assessment - 12/22/20 0001      Assessment   Medical Diagnosis L TKA    Referring Provider (PT) Aluisio    Onset Date/Surgical Date 12/19/20    Next MD Visit 01/05/2021    Prior Therapy PT for knee pain      Precautions   Precautions None      Restrictions   Weight Bearing Restrictions No      Balance Screen   Has the patient fallen  in the past 6 months Yes    How many times? 1   slipped on water on bathroom floor   Has the patient had a decrease in activity level because of a fear of falling?  No    Is the patient reluctant to leave their home because of a fear of falling?  No      Home Environment   Additional Comments 4 stairs to enter and 4 stairs in the home both with handrails      Prior Function   Level of Independence Independent    Vocation Retired    Public librarian, walking      Observation/Other Assessments-Edema    Edema Circumferential      Circumferential Edema   Circumferential - Right 42.5 cm mid patella    Circumferential - Left  45.5 cm mid patella      Functional Tests   Functional tests Sit to Stand      Sit to Stand   Comments leaning to RLE, excessive forward flexion, difficulty with eccentric control      ROM / Strength   AROM / PROM / Strength AROM;PROM      AROM   AROM Assessment Site Knee    Right/Left Knee  Left    Left Knee Extension 15    Left Knee Flexion 78      PROM   PROM Assessment Site Knee    Right/Left Knee Left    Left Knee Extension 13    Left Knee Flexion 80      Ambulation/Gait   Gait Comments antalgic gait with RW, knee flexed throughout, excess forward lean      Standardized Balance Assessment   Standardized Balance Assessment Timed Up and Go Test      Timed Up and Go Test   Normal TUG (seconds) 47.4    TUG Comments with RW                      Objective measurements completed on examination: See above findings.       [facility] Adult PT Treatment/Exercise - 12/22/20 0001      Exercises   Exercises Knee/Hip      Knee/Hip Exercises: Stretches   Knee: Self-Stretch to increase Flexion Left;2 reps;20 seconds    Other Knee/Hip Stretches knee extension stretch with quad set x5 3 sec hold                  PT Education - 12/22/20 1648    Education Details Pt educated on POC and HEP    Person(s) Educated Patient    Methods Explanation;Demonstration;Handout    Comprehension Verbalized understanding;Returned demonstration            PT Short Term Goals - 12/22/20 1840      PT SHORT TERM GOAL #1   Title Pt will be I with initial HEP    Time 2    Period Weeks    Status New    Target Date 01/05/21             PT Long Term Goals - 12/22/20 1841      PT LONG TERM GOAL #1   Title Pt will demo gait without AD and with no major gait deviations    Time 8    Period Weeks    Status New    Target Date 02/16/21      PT LONG TERM GOAL #2   Title Pt will demo  knee flexion to 120 deg    Baseline 80 deg PROM    Time 8    Period Weeks    Status New    Target Date 02/16/21      PT LONG TERM GOAL #3   Title Pt will demo full L knee extension    Baseline -13 deg    Time 8    Period Weeks    Status New    Target Date 02/16/21      PT LONG TERM GOAL #4   Title Pt will demo TUG <15 sec with LRAD    Baseline 40.74 sec with RW    Time 8     Period Weeks    Status New    Target Date 02/16/21      PT LONG TERM GOAL #5   Title Pt will demo circumferential edema of L knee </= 2 cm diff from R knee    Time 8    Period Weeks    Status New    Target Date 02/16/21                  Plan - 12/22/20 1649    Clinical Impression Statement Pt presents to clinic s/p L TKA 12/19/2020. Ambulates into clinic with RW, decreased cadence, antalgic gait, and knee flexed throughout. Pt lacks 13 deg terminal knee extension and has 80 deg flexion upon eval today. Pt reports icing several times a day; circumferential edema >3 cm diff from R knee. Added knee flexion stretching and knee extension stretching with quad set with pt demo understanding. Prior to surgery, pt was walking with no AD and would like to get back to this level of function. Pt does plan to have R knee replaced as well but waiting to see how L TKA rehab goes before scheduling.    Personal Factors and Comorbidities Comorbidity 2    Comorbidities HTN, hypothyroidism, hx of blood clots (on eliquis)    Examination-Activity Limitations Locomotion Level;Stairs    Examination-Participation Restrictions Community Activity;Interpersonal Relationship;Yard Work    Biomedical scientist Low    Rehab Potential Good    PT Frequency 2x / week    PT Duration 8 weeks    PT Treatment/Interventions ADLs/Self Care Home Management;Electrical Stimulation;Gait training;Stair training;Functional mobility training;Therapeutic activities;Therapeutic exercise;Balance training;Manual techniques;Patient/family education;Passive range of motion;Vasopneumatic Device    PT Next Visit Plan initiate ROM/strength ex's, gait training, vaso    PT Home Exercise Plan knee flexion/extension stretching, supine ex's prescribed from hospital    Consulted and Agree with Plan of Care Patient           Patient will benefit from skilled  therapeutic intervention in order to improve the following deficits and impairments:  Abnormal gait,Pain,Decreased mobility,Decreased activity tolerance,Decreased range of motion,Decreased strength,Hypomobility,Difficulty walking,Increased edema  Visit Diagnosis: Acute pain of left knee  Stiffness of left knee, not elsewhere classified  Difficulty in walking, not elsewhere classified  Localized edema     Problem List Patient Active Problem List   Diagnosis Date Noted  . OA (osteoarthritis) of knee 12/19/2020  . Primary osteoarthritis of left knee 12/19/2020  . Obstructive sleep apnea 06/19/2019  . Malignant neoplasm of overlapping sites of right breast in female, estrogen receptor positive ([place]) 05/14/2019  . Coagulopathy ([place]) 05/14/2019  . Morbid obesity ([place]) 05/14/2019  . Chronic thromboembolic disease ([place]) 85/88/5027  . Pulmonary vascular disease ([place]) 04/08/2019  . Hypothyroidism 04/07/2019  . Essential hypertension  04/07/2019  . Pulmonary embolism ([place]) 04/06/2019  . Abnormal liver function 04/06/2019  . Hypokalemia 04/06/2019  . Incisional hernia 04/06/2016   Amador Cunas, PT, DPT [facility] Pritt 12/22/2020, 6:51 PM  [place]. [place], [place], [address] Phone: [phone]   Fax:  [phone]  Name: Cassandra [name] MRN: [id number] Date of Birth: 10-20-1945

## 2020-12-28 ENCOUNTER — Other Ambulatory Visit: Payer: Self-pay

## 2020-12-28 ENCOUNTER — Ambulatory Visit: Payer: Medicare HMO | Attending: Orthopedic Surgery | Admitting: Physical Therapy

## 2020-12-28 ENCOUNTER — Encounter: Payer: Self-pay | Admitting: Physical Therapy

## 2020-12-28 DIAGNOSIS — R262 Difficulty in walking, not elsewhere classified: Secondary | ICD-10-CM | POA: Diagnosis not present

## 2020-12-28 DIAGNOSIS — R600 Localized edema: Secondary | ICD-10-CM | POA: Insufficient documentation

## 2020-12-28 DIAGNOSIS — M25562 Pain in left knee: Secondary | ICD-10-CM | POA: Insufficient documentation

## 2020-12-28 DIAGNOSIS — M25662 Stiffness of left knee, not elsewhere classified: Secondary | ICD-10-CM | POA: Insufficient documentation

## 2020-12-28 NOTE — Therapy (Signed)
[address] [place], [place], [address] Phone: [phone]   Fax:  [phone]  Physical Therapy Treatment  Patient Details  Name: Cassandra [name] MRN: [id number] Date of Birth: Feb 06, 1946 Referring Provider (PT): Aluisio   Encounter Date: 12/28/2020   PT End of Session - 12/28/20 1426    Visit Number 2    Number of Visits 12    Date for PT Re-Evaluation 03/16/21    PT Start Time 8242    PT Stop Time 1427    PT Time Calculation (min) 52 min    Activity Tolerance Patient tolerated treatment well    Behavior During Therapy [facility] for tasks assessed/performed           Past Medical History:  Diagnosis Date  . Arthritis   . Cancer [facility])    Breast cancer right '06- surgery, chemo, radiation- released after 5 yrs  . Dyspnea   . Hx of seasonal allergies    OTC med used  . Hypertension   . Hypothyroidism     Past Surgical History:  Procedure Laterality Date  . BREAST SURGERY Right    modified partial mastectomy/ lymph nodes dissection  . COLON SURGERY  2/17  . INCISIONAL HERNIA REPAIR N/A 04/06/2016   Procedure: LAPAROSCOPIC ASSISTED INCISIONAL HERNIA REPAIR WITH MESH;  Surgeon: Leighton Ruff, MD;  Location: [facility];  Service: General;  Laterality: N/A;  . TONSILLECTOMY    . TOTAL KNEE ARTHROPLASTY Left 12/19/2020   Procedure: TOTAL KNEE ARTHROPLASTY;  Surgeon: Gaynelle Arabian, MD;  Location: [facility];  Service: Orthopedics;  Laterality: Left;  5min    There were no vitals filed for this visit.   Subjective Assessment - 12/28/20 1334    Subjective "Been having a lot of pain" Pain comes and goes, some days are fine others are awful.    Currently in Pain? Yes    Pain Score 5     Pain Location Knee    Pain Orientation Left                             [facility] Adult PT Treatment/Exercise - 12/28/20 0001      Knee/Hip Exercises: Aerobic   Nustep L3 x 6 min      Knee/Hip Exercises: Machines  for Strengthening   Total Gym Leg Press 20lb 2x10      Knee/Hip Exercises: Standing   Other Standing Knee Exercises Standing maech in RW 2x10      Knee/Hip Exercises: Seated   Long Arc Quad Left;2 sets;10 reps    Hamstring Curl Right;2 sets;10 reps    Hamstring Limitations yellow Tband    Sit to Sand 2 sets;without UE support;5 reps;with UE support      Modalities   Modalities Cryotherapy;Vasopneumatic      Vasopneumatic   Number Minutes Vasopneumatic  10 minutes    Vasopnuematic Location  Knee    Vasopneumatic Pressure Medium    Vasopneumatic Temperature  34      Manual Therapy   Manual Therapy Passive ROM    Manual therapy comments Some PROM taken to end range and held    Passive ROM L kne flexion and ext                    PT Short Term Goals - 12/22/20 1840      PT SHORT TERM GOAL #1   Title Pt will be  I with initial HEP    Time 2    Period Weeks    Status New    Target Date 01/05/21             PT Long Term Goals - 12/22/20 1841      PT LONG TERM GOAL #1   Title Pt will demo gait without AD and with no major gait deviations    Time 8    Period Weeks    Status New    Target Date 02/16/21      PT LONG TERM GOAL #2   Title Pt will demo knee flexion to 120 deg    Baseline 80 deg PROM    Time 8    Period Weeks    Status New    Target Date 02/16/21      PT LONG TERM GOAL #3   Title Pt will demo full L knee extension    Baseline -13 deg    Time 8    Period Weeks    Status New    Target Date 02/16/21      PT LONG TERM GOAL #4   Title Pt will demo TUG <15 sec with LRAD    Baseline 40.74 sec with RW    Time 8    Period Weeks    Status New    Target Date 02/16/21      PT LONG TERM GOAL #5   Title Pt will demo circumferential edema of L knee </= 2 cm diff from R knee    Time 8    Period Weeks    Status New    Target Date 02/16/21                 Plan - 12/28/20 1427    Clinical Impression Statement Pt tolerated an initial  progression to TE and MT well. Pt was guarded with PROM reporting pain at end range. L knee ROM is very limited with tight end feel. Cues needed to full extend LLE with LAQ. Some compensation noted with sit to stands.    Personal Factors and Comorbidities Comorbidity 2    Comorbidities HTN, hypothyroidism, hx of blood clots (on eliquis)    Examination-Activity Limitations Locomotion Level;Stairs    Examination-Participation Restrictions Community Activity;Interpersonal Relationship;Yard Work    [employer] Evolving/Moderate complexity    Rehab Potential Good    PT Frequency 2x / week    PT Duration 8 weeks    PT Treatment/Interventions ADLs/Self Care Home Management;Electrical Stimulation;Gait training;Stair training;Functional mobility training;Therapeutic activities;Therapeutic exercise;Balance training;Manual techniques;Patient/family education;Passive range of motion;Vasopneumatic Device    PT Next Visit Plan initiate ROM/strength ex's, gait training, vaso           Patient will benefit from skilled therapeutic intervention in order to improve the following deficits and impairments:  Abnormal gait,Pain,Decreased mobility,Decreased activity tolerance,Decreased range of motion,Decreased strength,Hypomobility,Difficulty walking,Increased edema  Visit Diagnosis: Difficulty in walking, not elsewhere classified  Localized edema  Stiffness of left knee, not elsewhere classified  Acute pain of left knee     Problem List Patient Active Problem List   Diagnosis Date Noted  . OA (osteoarthritis) of knee 12/19/2020  . Primary osteoarthritis of left knee 12/19/2020  . Obstructive sleep apnea 06/19/2019  . Malignant neoplasm of overlapping sites of right breast in female, estrogen receptor positive ([place]) 05/14/2019  . Coagulopathy ([place]) 05/14/2019  . Morbid obesity ([place]) 05/14/2019  . Chronic thromboembolic disease ([place]) 60/63/0160  . Pulmonary vascular disease  (  [place]) 04/08/2019  . Hypothyroidism 04/07/2019  . Essential hypertension 04/07/2019  . Pulmonary embolism ([place]) 04/06/2019  . Abnormal liver function 04/06/2019  . Hypokalemia 04/06/2019  . Incisional hernia 04/06/2016    Scot Jun, PTA 12/28/2020, 2:33 PM  [address]. [place], [place], [address] Phone: [phone]   Fax:  [phone]  Name: Cassandra [name] MRN: [id number] Date of Birth: 1946-01-30

## 2020-12-30 ENCOUNTER — Ambulatory Visit: Payer: Medicare HMO

## 2020-12-30 ENCOUNTER — Other Ambulatory Visit: Payer: Self-pay

## 2020-12-30 DIAGNOSIS — R600 Localized edema: Secondary | ICD-10-CM | POA: Diagnosis not present

## 2020-12-30 DIAGNOSIS — M25562 Pain in left knee: Secondary | ICD-10-CM

## 2020-12-30 DIAGNOSIS — M25662 Stiffness of left knee, not elsewhere classified: Secondary | ICD-10-CM | POA: Diagnosis not present

## 2020-12-30 DIAGNOSIS — R262 Difficulty in walking, not elsewhere classified: Secondary | ICD-10-CM | POA: Diagnosis not present

## 2020-12-30 NOTE — Therapy (Signed)
[place]. [place], [place], [address] Phone: [phone]   Fax:  [phone]  Physical Therapy Treatment  Patient Details  Name: Cassandra [name] MRN: [id number] Date of Birth: 15-Jan-1946 Referring Provider (PT): Aluisio   Encounter Date: 12/30/2020   PT End of Session - 12/30/20 1031    Visit Number 3    Number of Visits 12    Date for PT Re-Evaluation 03/16/21    PT Start Time 3154    PT Stop Time 1104    PT Time Calculation (min) 50 min    Activity Tolerance Patient tolerated treatment well    Behavior During Therapy [facility] for tasks assessed/performed           Past Medical History:  Diagnosis Date  . Arthritis   . Cancer [facility])    Breast cancer right '06- surgery, chemo, radiation- released after 5 yrs  . Dyspnea   . Hx of seasonal allergies    OTC med used  . Hypertension   . Hypothyroidism     Past Surgical History:  Procedure Laterality Date  . BREAST SURGERY Right    modified partial mastectomy/ lymph nodes dissection  . COLON SURGERY  2/17  . INCISIONAL HERNIA REPAIR N/A 04/06/2016   Procedure: LAPAROSCOPIC ASSISTED INCISIONAL HERNIA REPAIR WITH MESH;  Surgeon: Leighton Ruff, MD;  [facility];  Service: General;  Laterality: N/A;  . TONSILLECTOMY    . TOTAL KNEE ARTHROPLASTY Left 12/19/2020   Procedure: TOTAL KNEE ARTHROPLASTY;  Surgeon: Gaynelle Arabian, MD;  [facility];  Service: Orthopedics;  Laterality: Left;  64min    There were no vitals filed for this visit.   Subjective Assessment - 12/30/20 1017    Subjective "comes and goes with a dull ache about a 4 to a 7" " feel stiff this morning"    Pertinent History hx of blood clots (on eliquis), HTN, hypothyroidism    Diagnostic tests xrays    Patient Stated Goals return to PLOF, walk without AD    Currently in Pain? Yes    Pain Score 4     Pain Location Knee    Pain Orientation Left    Pain Descriptors / Indicators  Aching;Dull    Pain Type Surgical pain             [facility] Adult PT Treatment/Exercise - 12/30/20 0001      Knee/Hip Exercises: Aerobic   Nustep L3 x 6 min      Knee/Hip Exercises: Machines for Strengthening   Total Gym Leg Press --      Knee/Hip Exercises: Standing   Other Standing Knee Exercises Standing march in RW 2x10      Knee/Hip Exercises: Seated   Long Arc Quad Left;2 sets;10 reps    Hamstring Curl Right;2 sets;10 reps    Hamstring Limitations yellow Tband    Sit to Sand 2 sets;without UE support;5 reps;with UE support      Knee/Hip Exercises: Supine   Quad Sets Left;1 set;10 reps      Modalities   Modalities Cryotherapy;Vasopneumatic      Vasopneumatic   Number Minutes Vasopneumatic  10 minutes    Vasopnuematic Location  Knee    Vasopneumatic Pressure Medium    Vasopneumatic Temperature  34      Manual Therapy   Manual Therapy Passive ROM    Manual therapy comments Some PROM taken to end range and held    Passive ROM L kne  flexion and ext                    PT Short Term Goals - 12/22/20 1840      PT SHORT TERM GOAL #1   Title Pt will be I with initial HEP    Time 2    Period Weeks    Status New    Target Date 01/05/21             PT Long Term Goals - 12/22/20 1841      PT LONG TERM GOAL #1   Title Pt will demo gait without AD and with no major gait deviations    Time 8    Period Weeks    Status New    Target Date 02/16/21      PT LONG TERM GOAL #2   Title Pt will demo knee flexion to 120 deg    Baseline 80 deg PROM    Time 8    Period Weeks    Status New    Target Date 02/16/21      PT LONG TERM GOAL #3   Title Pt will demo full L knee extension    Baseline -13 deg    Time 8    Period Weeks    Status New    Target Date 02/16/21      PT LONG TERM GOAL #4   Title Pt will demo TUG <15 sec with LRAD    Baseline 40.74 sec with RW    Time 8    Period Weeks    Status New    Target Date 02/16/21      PT LONG TERM  GOAL #5   Title Pt will demo circumferential edema of L knee </= 2 cm diff from R knee    Time 8    Period Weeks    Status New    Target Date 02/16/21                 Plan - 12/30/20 1032    Clinical Impression Statement Pt tolerated exercises and manual well today. She continues to have some guarding with PROM reporting pain at end range. L knee ROM is very limited with tight end feel. Cues needed to fully extend LLE with LAQ. Some compensation noted with sit to stands but completed them without too much UE assist today.    Personal Factors and Comorbidities Comorbidity 2    Comorbidities HTN, hypothyroidism, hx of blood clots (on eliquis)    Examination-Activity Limitations Locomotion Level;Stairs    Examination-Participation Restrictions Community Activity;Interpersonal Relationship;Yard Work    Rehab Potential Good    PT Frequency 2x / week    PT Duration 8 weeks    PT Treatment/Interventions ADLs/Self Care Home Management;Electrical Stimulation;Gait training;Stair training;Functional mobility training;Therapeutic activities;Therapeutic exercise;Balance training;Manual techniques;Patient/family education;Passive range of motion;Vasopneumatic Device    PT Next Visit Plan initiate ROM/strength ex's, gait training, vaso    Consulted and Agree with Plan of Care Patient           Patient will benefit from skilled therapeutic intervention in order to improve the following deficits and impairments:  Abnormal gait,Pain,Decreased mobility,Decreased activity tolerance,Decreased range of motion,Decreased strength,Hypomobility,Difficulty walking,Increased edema  Visit Diagnosis: Difficulty in walking, not elsewhere classified  Localized edema  Stiffness of left knee, not elsewhere classified  Acute pain of left knee     Problem List Patient Active Problem List   Diagnosis Date Noted  . OA (osteoarthritis) of  knee 12/19/2020  . Primary osteoarthritis of left knee 12/19/2020   . Obstructive sleep apnea 06/19/2019  . Malignant neoplasm of overlapping sites of right breast in female, estrogen receptor positive ([place]) 05/14/2019  . Coagulopathy ([place]) 05/14/2019  . Morbid obesity ([place]) 05/14/2019  . Chronic thromboembolic disease ([place]) 87/21/5872  . Pulmonary vascular disease ([place]) 04/08/2019  . Hypothyroidism 04/07/2019  . Essential hypertension 04/07/2019  . Pulmonary embolism ([place]) 04/06/2019  . Abnormal liver function 04/06/2019  . Hypokalemia 04/06/2019  . Incisional hernia 04/06/2016    Hall Busing , PT, DPT 12/30/2020, 11:13 AM  [place]. [place], [place], [address] Phone: [phone]   Fax:  [phone]  Name: JUANETTA NEGASH MRN: [id number] Date of Birth: [birth year]

## 2021-01-03 ENCOUNTER — Encounter: Payer: Self-pay | Admitting: Physical Therapy

## 2021-01-03 ENCOUNTER — Other Ambulatory Visit: Payer: Self-pay

## 2021-01-03 ENCOUNTER — Ambulatory Visit: Payer: Medicare HMO | Admitting: Physical Therapy

## 2021-01-03 DIAGNOSIS — M8580 Other specified disorders of bone density and structure, unspecified site: Secondary | ICD-10-CM | POA: Diagnosis not present

## 2021-01-03 DIAGNOSIS — R262 Difficulty in walking, not elsewhere classified: Secondary | ICD-10-CM | POA: Diagnosis not present

## 2021-01-03 DIAGNOSIS — E039 Hypothyroidism, unspecified: Secondary | ICD-10-CM | POA: Diagnosis not present

## 2021-01-03 DIAGNOSIS — M25662 Stiffness of left knee, not elsewhere classified: Secondary | ICD-10-CM | POA: Diagnosis not present

## 2021-01-03 DIAGNOSIS — M25562 Pain in left knee: Secondary | ICD-10-CM | POA: Diagnosis not present

## 2021-01-03 DIAGNOSIS — R600 Localized edema: Secondary | ICD-10-CM | POA: Diagnosis not present

## 2021-01-03 DIAGNOSIS — E785 Hyperlipidemia, unspecified: Secondary | ICD-10-CM | POA: Diagnosis not present

## 2021-01-03 DIAGNOSIS — I10 Essential (primary) hypertension: Secondary | ICD-10-CM | POA: Diagnosis not present

## 2021-01-03 DIAGNOSIS — M170 Bilateral primary osteoarthritis of knee: Secondary | ICD-10-CM | POA: Diagnosis not present

## 2021-01-03 DIAGNOSIS — G4700 Insomnia, unspecified: Secondary | ICD-10-CM | POA: Diagnosis not present

## 2021-01-03 NOTE — Therapy (Signed)
[name]. [place], [place], [address] Phone: [phone]   Fax:  [phone]  Physical Therapy Treatment  Patient Details  Name: Cassandra [name] MRN: [id number] Date of Birth: [birth year] Referring Provider (PT): Aluisio   Encounter Date: 01/03/2021   PT End of Session - 01/03/21 1412    Visit Number 4    Date for PT Re-Evaluation 03/16/21    PT Start Time 1315    PT Stop Time 1407    PT Time Calculation (min) 52 min    Activity Tolerance Patient tolerated treatment well    Behavior During Therapy [facility] for tasks assessed/performed           Past Medical History:  Diagnosis Date  . Arthritis   . Cancer [facility])    Breast cancer right '06- surgery, chemo, radiation- released after 5 yrs  . Dyspnea   . Hx of seasonal allergies    OTC med used  . Hypertension   . Hypothyroidism     Past Surgical History:  Procedure Laterality Date  . BREAST SURGERY Right    modified partial mastectomy/ lymph nodes dissection  . COLON SURGERY  2/17  . INCISIONAL HERNIA REPAIR N/A 04/06/2016   Procedure: LAPAROSCOPIC ASSISTED INCISIONAL HERNIA REPAIR WITH MESH;  Surgeon: Leighton Ruff, MD;  [facility];  Service: General;  Laterality: N/A;  . TONSILLECTOMY    . TOTAL KNEE ARTHROPLASTY Left 12/19/2020   Procedure: TOTAL KNEE ARTHROPLASTY;  Surgeon: Gaynelle Arabian, MD;  [facility];  Service: Orthopedics;  Laterality: Left;  75min    There were no vitals filed for this visit.   Subjective Assessment - 01/03/21 1313    Subjective Pt reports extra stiff this morning    Currently in Pain? Yes    Pain Score 6     Pain Location Knee    Pain Orientation Left    Pain Descriptors / Indicators Aching              [facility] PT Assessment - 01/03/21 0001      Assessment   Next MD Visit 01/03/2021      AROM   Left Knee Extension 10    Left Knee Flexion 85      PROM   Left Knee Extension 8    Left Knee Flexion 93                          [facility] Adult PT Treatment/Exercise - 01/03/21 0001      Ambulation/Gait   Gait Comments gait with SPC x75 ft around clinic      Knee/Hip Exercises: Aerobic   Recumbent Bike L1 x 4 min partial revolutions    Nustep L5 x 6 min      Knee/Hip Exercises: Standing   Other Standing Knee Exercises standing march with RW x10      Knee/Hip Exercises: Seated   Long Arc Quad Left;2 sets;10 reps    Hamstring Curl Right;2 sets;10 reps    Hamstring Limitations red TB    Sit to Sand 2 sets;5 reps;without UE support   from raised mat table     Vasopneumatic   Number Minutes Vasopneumatic  10 minutes    Vasopnuematic Location  Knee    Vasopneumatic Pressure Medium    Vasopneumatic Temperature  34      Manual Therapy   Manual Therapy Passive ROM    Manual therapy comments Some PROM  taken to end range and held    Passive ROM L kne flexion and ext                    PT Short Term Goals - 01/03/21 1417      PT SHORT TERM GOAL #1   Title Pt will be I with initial HEP    Time 2    Period Weeks    Status Achieved    Target Date 01/05/21             PT Long Term Goals - 01/03/21 1417      PT LONG TERM GOAL #1   Title Pt will demo gait without AD and with no major gait deviations    Baseline gait supervision-CGA with SPC today    Time 8    Period Weeks    Status On-going      PT LONG TERM GOAL #2   Title Pt will demo knee flexion to 120 deg    Baseline 90 deg PROM    Time 8    Period Weeks    Status On-going      PT LONG TERM GOAL #3   Title Pt will demo full L knee extension    Baseline -8 deg    Time 8    Period Weeks    Status On-going      PT LONG TERM GOAL #4   Title Pt will demo TUG <15 sec with LRAD    Baseline 40.74 sec with RW    Time 8    Period Weeks    Status New      PT LONG TERM GOAL #5   Title Pt will demo circumferential edema of L knee </= 2 cm diff from R knee    Time 8    Period Weeks    Status  New                 Plan - 01/03/21 1412    Clinical Impression Statement Pt demos L knee flexion increased to 90 deg with end range stretching this rx. Reinforced education on importance of knee flexion/ext ex's with VU and agreement. Started working on gait with SPC; no LOB but did have increased swaying when distracted. Cues for increased L heel strike and toe off. Continue working on this in future sessions. Vaso at end for edema.    PT Treatment/Interventions ADLs/Self Care Home Management;Electrical Stimulation;Gait training;Stair training;Functional mobility training;Therapeutic activities;Therapeutic exercise;Balance training;Manual techniques;Patient/family education;Passive range of motion;Vasopneumatic Device    PT Next Visit Plan follow up on MD appt, gait training with SPC, progress ROM/strength ex's, gait training, vaso    Consulted and Agree with Plan of Care Patient           Patient will benefit from skilled therapeutic intervention in order to improve the following deficits and impairments:  Abnormal gait,Pain,Decreased mobility,Decreased activity tolerance,Decreased range of motion,Decreased strength,Hypomobility,Difficulty walking,Increased edema  Visit Diagnosis: Difficulty in walking, not elsewhere classified  Localized edema  Stiffness of left knee, not elsewhere classified  Acute pain of left knee     Problem List Patient Active Problem List   Diagnosis Date Noted  . OA (osteoarthritis) of knee 12/19/2020  . Primary osteoarthritis of left knee 12/19/2020  . Obstructive sleep apnea 06/19/2019  . Malignant neoplasm of overlapping sites of right breast in female, estrogen receptor positive ([place]) 05/14/2019  . Coagulopathy ([place]) 05/14/2019  . Morbid obesity ([place]) 05/14/2019  . Chronic thromboembolic  disease ([place]) 04/08/2019  . Pulmonary vascular disease ([place]) 04/08/2019  . Hypothyroidism 04/07/2019  . Essential hypertension 04/07/2019  . Pulmonary  embolism ([place]) 04/06/2019  . Abnormal liver function 04/06/2019  . Hypokalemia 04/06/2019  . Incisional hernia 04/06/2016   Cassandra [name], PT, DPT Cassandra [name] Cassandra [name] 01/03/2021, 2:18 PM  [place]. [place], [place], [address] Phone: [phone]   Fax:  [phone]  Name: Cassandra [name] MRN: [id number] Date of Birth: December 09, 1945

## 2021-01-10 ENCOUNTER — Other Ambulatory Visit: Payer: Self-pay

## 2021-01-10 ENCOUNTER — Ambulatory Visit: Payer: Medicare HMO | Admitting: Physical Therapy

## 2021-01-10 ENCOUNTER — Encounter: Payer: Self-pay | Admitting: Physical Therapy

## 2021-01-10 DIAGNOSIS — M25562 Pain in left knee: Secondary | ICD-10-CM

## 2021-01-10 DIAGNOSIS — R262 Difficulty in walking, not elsewhere classified: Secondary | ICD-10-CM | POA: Diagnosis not present

## 2021-01-10 DIAGNOSIS — R600 Localized edema: Secondary | ICD-10-CM | POA: Diagnosis not present

## 2021-01-10 DIAGNOSIS — M25662 Stiffness of left knee, not elsewhere classified: Secondary | ICD-10-CM | POA: Diagnosis not present

## 2021-01-10 NOTE — Therapy (Signed)
[place]. [place], [place], [address] Phone: [phone]   Fax:  [phone]  Physical Therapy Treatment  Patient Details  Name: Cassandra [name] MRN: [id number] Date of Birth: [birth year] Referring Provider (PT): Aluisio   Encounter Date: 01/10/2021   PT End of Session - 01/10/21 1546    Visit Number 5    Number of Visits 12    Date for PT Re-Evaluation 03/16/21    PT Start Time 0762    PT Stop Time 1530    PT Time Calculation (min) 45 min    Activity Tolerance Patient tolerated treatment well    Behavior During Therapy [facility] for tasks assessed/performed           Past Medical History:  Diagnosis Date  . Arthritis   . Cancer [facility])    Breast cancer right '06- surgery, chemo, radiation- released after 5 yrs  . Dyspnea   . Hx of seasonal allergies    OTC med used  . Hypertension   . Hypothyroidism     Past Surgical History:  Procedure Laterality Date  . BREAST SURGERY Right    modified partial mastectomy/ lymph nodes dissection  . COLON SURGERY  2/17  . INCISIONAL HERNIA REPAIR N/A 04/06/2016   Procedure: LAPAROSCOPIC ASSISTED INCISIONAL HERNIA REPAIR WITH MESH;  Surgeon: Leighton Ruff, MD;  [facility];  Service: General;  Laterality: N/A;  . TONSILLECTOMY    . TOTAL KNEE ARTHROPLASTY Left 12/19/2020   Procedure: TOTAL KNEE ARTHROPLASTY;  Surgeon: Gaynelle Arabian, MD;  [facility];  Service: Orthopedics;  Laterality: Left;  75min    There were no vitals filed for this visit.   Subjective Assessment - 01/10/21 1448    Subjective Pt reports doing well today    Currently in Pain? Yes    Pain Score 3     Pain Location Knee    Pain Orientation Left              [facility] PT Assessment - 01/10/21 0001      PROM   Left Knee Extension 6    Left Knee Flexion 95                         [facility] Adult PT Treatment/Exercise - 01/10/21 0001      Knee/Hip Exercises: Stretches    Knee: Self-Stretch to increase Flexion Left;5 reps;20 seconds      Knee/Hip Exercises: Aerobic   Recumbent Bike L1 x 4 min partial revolutions    Nustep L5 x 6 min      Knee/Hip Exercises: Machines for Strengthening   Total Gym Leg Press no weight x8 with 10-15 sec hold at end range knee flexion      Knee/Hip Exercises: Standing   Other Standing Knee Exercises alt step taps with RW 2x10 B 6"and 8" step      Knee/Hip Exercises: Seated   Long Arc Quad Left;2 sets;10 reps    Hamstring Curl Right;2 sets;10 reps    Hamstring Limitations red TB    Sit to Sand 3 sets;5 reps;without UE support   from mat table; 3rd set with RLE on firm airex to encourage LLE WB     Manual Therapy   Manual Therapy Passive ROM    Manual therapy comments Some PROM taken to end range and held    Passive ROM L knee flexion and ext  PT Short Term Goals - 01/03/21 1417      PT SHORT TERM GOAL #1   Title Pt will be I with initial HEP    Time 2    Period Weeks    Status Achieved    Target Date 01/05/21             PT Long Term Goals - 01/10/21 1549      PT LONG TERM GOAL #1   Title Pt will demo gait without AD and with no major gait deviations    Baseline gait supervision with SPC today    Time 8    Period Weeks    Status On-going      PT LONG TERM GOAL #2   Title Pt will demo knee flexion to 120 deg    Baseline 95 deg PROM    Time 8    Period Weeks    Status On-going      PT LONG TERM GOAL #3   Title Pt will demo full L knee extension    Baseline -6 deg    Time 8    Period Weeks    Status On-going      PT LONG TERM GOAL #4   Title Pt will demo TUG <15 sec with LRAD    Baseline 40.74 sec with RW    Time 8    Period Weeks    Status New      PT LONG TERM GOAL #5   Title Pt will demo circumferential edema of L knee </= 2 cm diff from R knee    Time 8    Period Weeks    Status New                 Plan - 01/10/21 1547    Clinical Impression  Statement Pt tolerated progression of TE well. Continuation of gait with SPC; increased stability this rx. Cues needed for increased L heel strike. Close supervision with stairs, step to pattern; difficult because of pain in R knee as well as L knee. Progress knee ROM to tolerance.    PT Treatment/Interventions ADLs/Self Care Home Management;Electrical Stimulation;Gait training;Stair training;Functional mobility training;Therapeutic activities;Therapeutic exercise;Balance training;Manual techniques;Patient/family education;Passive range of motion;Vasopneumatic Device    PT Next Visit Plan gait training with SPC, progress ROM/strength ex's, gait training, vaso    Consulted and Agree with Plan of Care Patient           Patient will benefit from skilled therapeutic intervention in order to improve the following deficits and impairments:  Abnormal gait,Pain,Decreased mobility,Decreased activity tolerance,Decreased range of motion,Decreased strength,Hypomobility,Difficulty walking,Increased edema  Visit Diagnosis: Difficulty in walking, not elsewhere classified  Localized edema  Stiffness of left knee, not elsewhere classified  Acute pain of left knee     Problem List Patient Active Problem List   Diagnosis Date Noted  . OA (osteoarthritis) of knee 12/19/2020  . Primary osteoarthritis of left knee 12/19/2020  . Obstructive sleep apnea 06/19/2019  . Malignant neoplasm of overlapping sites of right breast in female, estrogen receptor positive ([place]) 05/14/2019  . Coagulopathy ([place]) 05/14/2019  . Morbid obesity ([place]) 05/14/2019  . Chronic thromboembolic disease ([place]) 19/14/7829  . Pulmonary vascular disease ([place]) 04/08/2019  . Hypothyroidism 04/07/2019  . Essential hypertension 04/07/2019  . Pulmonary embolism ([place]) 04/06/2019  . Abnormal liver function 04/06/2019  . Hypokalemia 04/06/2019  . Incisional hernia 04/06/2016   Amador Cunas, PT, DPT Donald Prose Ayako Tapanes 01/10/2021, 3:50  PM  [facility]  [place]. [place], [place], [address] Phone: [phone]   Fax:  [phone]  Name: Cassandra [name] MRN: [id number] Date of Birth: 08-21-1946

## 2021-01-11 DIAGNOSIS — I2699 Other pulmonary embolism without acute cor pulmonale: Secondary | ICD-10-CM | POA: Diagnosis not present

## 2021-01-12 ENCOUNTER — Other Ambulatory Visit: Payer: Self-pay

## 2021-01-12 ENCOUNTER — Encounter: Payer: Self-pay | Admitting: Physical Therapy

## 2021-01-12 ENCOUNTER — Ambulatory Visit: Payer: Medicare HMO | Admitting: Physical Therapy

## 2021-01-12 DIAGNOSIS — M25562 Pain in left knee: Secondary | ICD-10-CM | POA: Diagnosis not present

## 2021-01-12 DIAGNOSIS — R262 Difficulty in walking, not elsewhere classified: Secondary | ICD-10-CM

## 2021-01-12 DIAGNOSIS — R600 Localized edema: Secondary | ICD-10-CM | POA: Diagnosis not present

## 2021-01-12 DIAGNOSIS — M25662 Stiffness of left knee, not elsewhere classified: Secondary | ICD-10-CM

## 2021-01-12 NOTE — Therapy (Signed)
[place]. [place], [place], [address] Phone: [phone]   Fax:  [phone]  Physical Therapy Treatment  Patient Details  Name: Cassandra [name] MRN: [id number] Date of Birth: 26-Aug-1946 Referring Provider (PT): Aluisio   Encounter Date: 01/12/2021   PT End of Session - 01/12/21 1628    Visit Number 6    Number of Visits 12    Date for PT Re-Evaluation 03/16/21    PT Start Time 5852    PT Stop Time 1614    PT Time Calculation (min) 44 min    Activity Tolerance Patient tolerated treatment well    Behavior During Therapy [facility] for tasks assessed/performed           Past Medical History:  Diagnosis Date  . Arthritis   . Cancer [facility]    Breast cancer right '06- surgery, chemo, radiation- released after 5 yrs  . Dyspnea   . Hx of seasonal allergies    OTC med used  . Hypertension   . Hypothyroidism     Past Surgical History:  Procedure Laterality Date  . BREAST SURGERY Right    modified partial mastectomy/ lymph nodes dissection  . COLON SURGERY  2/17  . INCISIONAL HERNIA REPAIR N/A 04/06/2016   Procedure: LAPAROSCOPIC ASSISTED INCISIONAL HERNIA REPAIR WITH MESH;  Surgeon: Leighton Ruff, MD;  [facility];  Service: General;  Laterality: N/A;  . TONSILLECTOMY    . TOTAL KNEE ARTHROPLASTY Left 12/19/2020   Procedure: TOTAL KNEE ARTHROPLASTY;  Surgeon: Gaynelle Arabian, MD;  [facility];  Service: Orthopedics;  Laterality: Left;  35min    There were no vitals filed for this visit.   Subjective Assessment - 01/12/21 1532    Subjective Pt reports doing well today    Currently in Pain? Yes    Pain Score 1     Pain Location Knee    Pain Orientation Left              [facility] PT Assessment - 01/12/21 0001      PROM   Left Knee Flexion 98                         [facility] Adult PT Treatment/Exercise - 01/12/21 0001      Ambulation/Gait   Gait Comments gait with SPC around  clinic and around building, up/down curbs, and small incline with supervision assist      Knee/Hip Exercises: Aerobic   Recumbent Bike L1 x 4 min partial revolutions    Nustep L5 x 6 min      Knee/Hip Exercises: Seated   Long Arc Quad Left;2 sets;10 reps    Hamstring Curl Right;2 sets;10 reps    Hamstring Limitations red TB    Sit to Sand 2 sets;10 reps;without UE support   cues for equal WB     Manual Therapy   Manual Therapy Passive ROM    Passive ROM L knee flexion and ext                    PT Short Term Goals - 01/03/21 1417      PT SHORT TERM GOAL #1   Title Pt will be I with initial HEP    Time 2    Period Weeks    Status Achieved    Target Date 01/05/21             PT Long Term  Goals - 01/10/21 1549      PT LONG TERM GOAL #1   Title Pt will demo gait without AD and with no major gait deviations    Baseline gait supervision with SPC today    Time 8    Period Weeks    Status On-going      PT LONG TERM GOAL #2   Title Pt will demo knee flexion to 120 deg    Baseline 95 deg PROM    Time 8    Period Weeks    Status On-going      PT LONG TERM GOAL #3   Title Pt will demo full L knee extension    Baseline -6 deg    Time 8    Period Weeks    Status On-going      PT LONG TERM GOAL #4   Title Pt will demo TUG <15 sec with LRAD    Baseline 40.74 sec with RW    Time 8    Period Weeks    Status New      PT LONG TERM GOAL #5   Title Pt will demo circumferential edema of L knee </= 2 cm diff from R knee    Time 8    Period Weeks    Status New                 Plan - 01/12/21 1629    Clinical Impression Statement Pt demos improved stability with ambulation w/ SPC this rx. Walked around building, up/down curbs, and up small incline with supervision assist. Cues for improved heel strike. Recommended pt may begin using SPC around house but should still use walker when feeling unsteady/tired or in low lighting situations with pt VU and  agreement. Pt demos L knee flexion to 98 deg passively this rx; improved tolerance to passive ROM, able to get more motion with gentle knee distraction. Continue to progress to tolerance.    PT Treatment/Interventions ADLs/Self Care Home Management;Electrical Stimulation;Gait training;Stair training;Functional mobility training;Therapeutic activities;Therapeutic exercise;Balance training;Manual techniques;Patient/family education;Passive range of motion;Vasopneumatic Device    PT Next Visit Plan gait training with SPC, progress ROM/strength ex's, gait training, vaso    Consulted and Agree with Plan of Care Patient           Patient will benefit from skilled therapeutic intervention in order to improve the following deficits and impairments:  Abnormal gait,Pain,Decreased mobility,Decreased activity tolerance,Decreased range of motion,Decreased strength,Hypomobility,Difficulty walking,Increased edema  Visit Diagnosis: Difficulty in walking, not elsewhere classified  Localized edema  Stiffness of left knee, not elsewhere classified  Acute pain of left knee     Problem List Patient Active Problem List   Diagnosis Date Noted  . OA (osteoarthritis) of knee 12/19/2020  . Primary osteoarthritis of left knee 12/19/2020  . Obstructive sleep apnea 06/19/2019  . Malignant neoplasm of overlapping sites of right breast in female, estrogen receptor positive ([place]) 05/14/2019  . Coagulopathy ([place]) 05/14/2019  . Morbid obesity ([place]) 05/14/2019  . Chronic thromboembolic disease ([place]) 32/67/1245  . Pulmonary vascular disease ([place]) 04/08/2019  . Hypothyroidism 04/07/2019  . Essential hypertension 04/07/2019  . Pulmonary embolism ([place]) 04/06/2019  . Abnormal liver function 04/06/2019  . Hypokalemia 04/06/2019  . Incisional hernia 04/06/2016   Amador Cunas, PT, DPT Donald Prose Dwanda Tufano 01/12/2021, 4:33 PM  [place]. [place],  [place], [address] Phone: [phone]   Fax:  [phone]  Name: Cassandra [name] MRN: [id number]  Date of Birth: [birth year]

## 2021-01-17 ENCOUNTER — Other Ambulatory Visit: Payer: Self-pay

## 2021-01-17 ENCOUNTER — Ambulatory Visit: Payer: Medicare HMO | Admitting: Physical Therapy

## 2021-01-17 DIAGNOSIS — M25562 Pain in left knee: Secondary | ICD-10-CM | POA: Diagnosis not present

## 2021-01-17 DIAGNOSIS — R600 Localized edema: Secondary | ICD-10-CM

## 2021-01-17 DIAGNOSIS — R262 Difficulty in walking, not elsewhere classified: Secondary | ICD-10-CM | POA: Diagnosis not present

## 2021-01-17 DIAGNOSIS — M25662 Stiffness of left knee, not elsewhere classified: Secondary | ICD-10-CM | POA: Diagnosis not present

## 2021-01-17 NOTE — Therapy (Signed)
[place]. [place], [place], [address] Phone: [phone]   Fax:  [phone]  Physical Therapy Treatment  Patient Details  Name: Cassandra [name] MRN: [id number] Date of Birth: [birth year] Referring Provider (PT): Aluisio   Encounter Date: 01/17/2021   PT End of Session - 01/17/21 1318    Visit Number 7    Number of Visits 12    Date for PT Re-Evaluation 03/16/21    PT Start Time 3762    PT Stop Time 1355    PT Time Calculation (min) 40 min    Activity Tolerance Patient tolerated treatment well    Behavior During Therapy [facility] [place] [facility] for tasks assessed/performed           Past Medical History:  Diagnosis Date  . Arthritis   . Cancer [facility])    Breast cancer right '06- surgery, chemo, radiation- released after 5 yrs  . Dyspnea   . Hx of seasonal allergies    OTC med used  . Hypertension   . Hypothyroidism     Past Surgical History:  Procedure Laterality Date  . BREAST SURGERY Right    modified partial mastectomy/ lymph nodes dissection  . COLON SURGERY  2/17  . INCISIONAL HERNIA REPAIR N/A 04/06/2016   Procedure: LAPAROSCOPIC ASSISTED INCISIONAL HERNIA REPAIR WITH MESH;  Surgeon: Leighton Ruff, MD;  [facility];  Service: General;  Laterality: N/A;  . TONSILLECTOMY    . TOTAL KNEE ARTHROPLASTY Left 12/19/2020   Procedure: TOTAL KNEE ARTHROPLASTY;  Surgeon: Gaynelle Arabian, MD;  [facility];  Service: Orthopedics;  Laterality: Left;  22min    There were no vitals filed for this visit.   Subjective Assessment - 01/17/21 1319    Subjective Pt reported that she went to church sunday and did a lot of walking so having more nagging, aching pain than normal.    Currently in Pain? Yes    Pain Score 4     Pain Location Knee    Pain Orientation Left                             [facility] Adult PT Treatment/Exercise - 01/17/21 0001      Ambulation/Gait   Gait Comments gait with SPC around  clinic with cues for equal stance time, heel strike and L toes forward      Knee/Hip Exercises: Aerobic   Recumbent Bike L1 x 82mins partial revs.    Nustep L5 x 6 mins      Knee/Hip Exercises: Machines for Strengthening   Total Gym Leg Press no weight x10 with 5sec end range hold of knee flexion and ext, 20# x10      Knee/Hip Exercises: Standing   Forward Step Up Both;1 set;10 reps;Step Height: 6"   cues for knee bend and toe clearance on L   Other Standing Knee Exercises TKE 2x10 w/ green TBand      Knee/Hip Exercises: Seated   Long Arc Quad Left;2 sets;10 reps    Long Arc Quad Weight 3 lbs.    Hamstring Curl Left;2 sets;10 reps    Hamstring Limitations green TB    Sit to Sand 2 sets;10 reps;without UE support   RLE on airex to encourage LLE weight bearing                   PT Short Term Goals - 01/03/21 1417  PT SHORT TERM GOAL #1   Title Pt will be I with initial HEP    Time 2    Period Weeks    Status Achieved    Target Date 01/05/21             PT Long Term Goals - 01/10/21 1549      PT LONG TERM GOAL #1   Title Pt will demo gait without AD and with no major gait deviations    Baseline gait supervision with SPC today    Time 8    Period Weeks    Status On-going      PT LONG TERM GOAL #2   Title Pt will demo knee flexion to 120 deg    Baseline 95 deg PROM    Time 8    Period Weeks    Status On-going      PT LONG TERM GOAL #3   Title Pt will demo full L knee extension    Baseline -6 deg    Time 8    Period Weeks    Status On-going      PT LONG TERM GOAL #4   Title Pt will demo TUG <15 sec with LRAD    Baseline 40.74 sec with RW    Time 8    Period Weeks    Status New      PT LONG TERM GOAL #5   Title Pt will demo circumferential edema of L knee </= 2 cm diff from R knee    Time 8    Period Weeks    Status New                 Plan - 01/17/21 1402    Clinical Impression Statement Pt continues to demonstrate ability with  amb w/ SPC, cues needed during amb for heel strike and forward foot on L. Pt tolerated progression on TE well, with increased weight. Pt continues to show decreased ROM in L knee flexion and extension. Verbal and tactile cues needed during all standing TE for knee flexion and TKE.    PT Treatment/Interventions ADLs/Self Care Home Management;Electrical Stimulation;Gait training;Stair training;Functional mobility training;Therapeutic activities;Therapeutic exercise;Balance training;Manual techniques;Patient/family education;Passive range of motion;Vasopneumatic Device    PT Next Visit Plan gait training with SPC, progress ROM/strength ex's, gait training as tolerated.           Patient will benefit from skilled therapeutic intervention in order to improve the following deficits and impairments:  Abnormal gait,Pain,Decreased mobility,Decreased activity tolerance,Decreased range of motion,Decreased strength,Hypomobility,Difficulty walking,Increased edema  Visit Diagnosis: Stiffness of left knee, not elsewhere classified  Acute pain of left knee  Localized edema  Difficulty in walking, not elsewhere classified     Problem List Patient Active Problem List   Diagnosis Date Noted  . OA (osteoarthritis) of knee 12/19/2020  . Primary osteoarthritis of left knee 12/19/2020  . Obstructive sleep apnea 06/19/2019  . Malignant neoplasm of overlapping sites of right breast in female, estrogen receptor positive ([place]) 05/14/2019  . Coagulopathy ([place]) 05/14/2019  . Morbid obesity ([place]) 05/14/2019  . Chronic thromboembolic disease ([place]) 37/06/6268  . Pulmonary vascular disease ([place]) 04/08/2019  . Hypothyroidism 04/07/2019  . Essential hypertension 04/07/2019  . Pulmonary embolism ([place]) 04/06/2019  . Abnormal liver function 04/06/2019  . Hypokalemia 04/06/2019  . Incisional hernia 04/06/2016    Ernst Spell, SPTA 01/17/2021, 2:05 PM  [place]. [place], [place], [address] Phone: [phone]  Fax:  [phone]  Name: Cassandra [name] MRN: [id number] Date of Birth: 03/20/1946

## 2021-01-19 ENCOUNTER — Ambulatory Visit: Payer: Medicare HMO | Admitting: Physical Therapy

## 2021-01-19 ENCOUNTER — Other Ambulatory Visit: Payer: Self-pay

## 2021-01-19 DIAGNOSIS — R262 Difficulty in walking, not elsewhere classified: Secondary | ICD-10-CM

## 2021-01-19 DIAGNOSIS — R600 Localized edema: Secondary | ICD-10-CM | POA: Diagnosis not present

## 2021-01-19 DIAGNOSIS — M25562 Pain in left knee: Secondary | ICD-10-CM | POA: Diagnosis not present

## 2021-01-19 DIAGNOSIS — M25662 Stiffness of left knee, not elsewhere classified: Secondary | ICD-10-CM | POA: Diagnosis not present

## 2021-01-19 NOTE — Therapy (Signed)
[place]. [place], [place], [address] Phone: [phone]   Fax:  [phone]  Physical Therapy Treatment  Patient Details  Name: Cassandra [name] MRN: [id number] Date of Birth: 08/30/1946 Referring Provider (PT): Aluisio   Encounter Date: 01/19/2021   PT End of Session - 01/19/21 1533    Visit Number 8    Date for PT Re-Evaluation 03/16/21    PT Start Time 6237    PT Stop Time 1612    PT Time Calculation (min) 42 min    Activity Tolerance Patient tolerated treatment well    Behavior During Therapy [facility] for tasks assessed/performed           Past Medical History:  Diagnosis Date  . Arthritis   . Cancer [facility])    Breast cancer right '06- surgery, chemo, radiation- released after 5 yrs  . Dyspnea   . Hx of seasonal allergies    OTC med used  . Hypertension   . Hypothyroidism     Past Surgical History:  Procedure Laterality Date  . BREAST SURGERY Right    modified partial mastectomy/ lymph nodes dissection  . COLON SURGERY  2/17  . INCISIONAL HERNIA REPAIR N/A 04/06/2016   Procedure: LAPAROSCOPIC ASSISTED INCISIONAL HERNIA REPAIR WITH MESH;  Surgeon: Leighton Ruff, MD;  [facility];  Service: General;  Laterality: N/A;  . TONSILLECTOMY    . TOTAL KNEE ARTHROPLASTY Left 12/19/2020   Procedure: TOTAL KNEE ARTHROPLASTY;  Surgeon: Gaynelle Arabian, MD;  [facility];  Service: Orthopedics;  Laterality: Left;  23min    There were no vitals filed for this visit.   Subjective Assessment - 01/19/21 1531    Subjective Pt reports soreness from last session, having some increased pain in knee.    Currently in Pain? Yes    Pain Score 3     Pain Location Knee    Pain Orientation Left              [facility] PT Assessment - 01/19/21 0001      AROM   AROM Assessment Site Knee    Right/Left Knee Left    Left Knee Extension 10    Left Knee Flexion 89      PROM   PROM Assessment Site Knee    Left  Knee Extension 5    Left Knee Flexion 100      Standardized Balance Assessment   Standardized Balance Assessment Timed Up and Go Test      Timed Up and Go Test   Normal TUG (seconds) 15.6    TUG Comments with SPC                         [facility] Adult PT Treatment/Exercise - 01/19/21 0001      Knee/Hip Exercises: Aerobic   Nustep L5 x 6 mins      Knee/Hip Exercises: Machines for Strengthening   Total Gym Leg Press no weight x10 with 5sec end range hold of knee flexion and ext, 20# 2x10 w/ green Tband for TKE      Knee/Hip Exercises: Standing   Lateral Step Up Both;2 sets;10 reps;Hand Hold: 2;Step Height: 6"    Forward Step Up Both;2 sets;10 reps;Hand Hold: 2;Step Height: 6"      Knee/Hip Exercises: Seated   Long Arc Quad Left;2 sets;10 reps    Long Arc Quad Weight 3 lbs.    Hamstring Curl Left;2 sets;10 reps  Hamstring Limitations green TB      Manual Therapy   Manual Therapy Passive ROM    Manual therapy comments Some PROM taken to end range and held    Passive ROM L knee flexion and ext                    PT Short Term Goals - 01/03/21 1417      PT SHORT TERM GOAL #1   Title Pt will be I with initial HEP    Time 2    Period Weeks    Status Achieved    Target Date 01/05/21             PT Long Term Goals - 01/19/21 1632      PT LONG TERM GOAL #1   Title Pt will demo gait without AD and with no major gait deviations    Status On-going      PT LONG TERM GOAL #2   Title Pt will demo knee flexion to 120 deg    Baseline 100 deg. PROM, 89 deg AROM    Status On-going      PT LONG TERM GOAL #3   Title Pt will demo full L knee extension    Baseline -5 deg PROM, -10 deg AROM    Status On-going      PT LONG TERM GOAL #4   Title Pt will demo TUG <15 sec with LRAD    Baseline 15.6 with SPC    Status On-going      PT LONG TERM GOAL #5   Title Pt will demo circumferential edema of L knee </= 2 cm diff from R knee    Status On-going                  Plan - 01/19/21 1629    Clinical Impression Statement Pt continues to demonstrate ability to amb with SPC, TUG time decreased from initial test. Pt contined to tolerate progression of TE well, with cues still needed for TKE. Pt shows improvement in ROM in flexion and extension both PROM and AROM.    PT Treatment/Interventions ADLs/Self Care Home Management;Electrical Stimulation;Gait training;Stair training;Functional mobility training;Therapeutic activities;Therapeutic exercise;Balance training;Manual techniques;Patient/family education;Passive range of motion;Vasopneumatic Device    PT Next Visit Plan gait training with SPC, progress ROM/strength ex's, gait training as tolerated.           Patient will benefit from skilled therapeutic intervention in order to improve the following deficits and impairments:  Abnormal gait,Pain,Decreased mobility,Decreased activity tolerance,Decreased range of motion,Decreased strength,Hypomobility,Difficulty walking,Increased edema  Visit Diagnosis: Stiffness of left knee, not elsewhere classified  Acute pain of left knee  Localized edema  Difficulty in walking, not elsewhere classified     Problem List Patient Active Problem List   Diagnosis Date Noted  . OA (osteoarthritis) of knee 12/19/2020  . Primary osteoarthritis of left knee 12/19/2020  . Obstructive sleep apnea 06/19/2019  . Malignant neoplasm of overlapping sites of right breast in female, estrogen receptor positive ([place]) 05/14/2019  . Coagulopathy ([place]) 05/14/2019  . Morbid obesity ([place]) 05/14/2019  . Chronic thromboembolic disease ([place]) 62/22/9798  . Pulmonary vascular disease ([place]) 04/08/2019  . Hypothyroidism 04/07/2019  . Essential hypertension 04/07/2019  . Pulmonary embolism ([place]) 04/06/2019  . Abnormal liver function 04/06/2019  . Hypokalemia 04/06/2019  . Incisional hernia 04/06/2016    Ernst Spell, SPTA 01/19/2021, 4:36 PM  [place]. [place],  [place], [address] Phone: [phone]   Fax:  [phone]  Name: KETTY BITTON MRN: [id number] Date of Birth: 24-Dec-1945

## 2021-01-24 ENCOUNTER — Other Ambulatory Visit: Payer: Self-pay

## 2021-01-24 ENCOUNTER — Ambulatory Visit: Payer: Medicare HMO | Attending: Orthopedic Surgery | Admitting: Physical Therapy

## 2021-01-24 DIAGNOSIS — M25562 Pain in left knee: Secondary | ICD-10-CM | POA: Diagnosis not present

## 2021-01-24 DIAGNOSIS — R262 Difficulty in walking, not elsewhere classified: Secondary | ICD-10-CM | POA: Diagnosis not present

## 2021-01-24 DIAGNOSIS — R600 Localized edema: Secondary | ICD-10-CM | POA: Diagnosis not present

## 2021-01-24 DIAGNOSIS — Z4789 Encounter for other orthopedic aftercare: Secondary | ICD-10-CM | POA: Diagnosis not present

## 2021-01-24 DIAGNOSIS — M25662 Stiffness of left knee, not elsewhere classified: Secondary | ICD-10-CM | POA: Insufficient documentation

## 2021-01-24 DIAGNOSIS — Z471 Aftercare following joint replacement surgery: Secondary | ICD-10-CM | POA: Diagnosis not present

## 2021-01-24 NOTE — Therapy (Signed)
[place]. [place], [place], [address] Phone: [phone]   Fax:  [phone]  Physical Therapy Treatment  Patient Details  Name: Cassandra [name] MRN: [id number] Date of Birth: 1946/08/23 Referring Provider (PT): Aluisio   Encounter Date: 01/24/2021   PT End of Session - 01/24/21 1322    Visit Number 9    Number of Visits 12    Date for PT Re-Evaluation 03/16/21    PT Start Time 1318    PT Stop Time 1400    PT Time Calculation (min) 42 min    Activity Tolerance Patient tolerated treatment well    Behavior During Therapy [facility] for tasks assessed/performed           Past Medical History:  Diagnosis Date  . Arthritis   . Cancer [facility]    Breast cancer right '06- surgery, chemo, radiation- released after 5 yrs  . Dyspnea   . Hx of seasonal allergies    OTC med used  . Hypertension   . Hypothyroidism     Past Surgical History:  Procedure Laterality Date  . BREAST SURGERY Right    modified partial mastectomy/ lymph nodes dissection  . COLON SURGERY  2/17  . INCISIONAL HERNIA REPAIR N/A 04/06/2016   Procedure: LAPAROSCOPIC ASSISTED INCISIONAL HERNIA REPAIR WITH MESH;  Surgeon: Leighton Ruff, MD;  [facility]: [facility];  Service: General;  Laterality: N/A;  . TONSILLECTOMY    . TOTAL KNEE ARTHROPLASTY Left 12/19/2020   Procedure: TOTAL KNEE ARTHROPLASTY;  Surgeon: Gaynelle Arabian, MD;  [facility];  Service: Orthopedics;  Laterality: Left;  57min    There were no vitals filed for this visit.   Subjective Assessment - 01/24/21 1321    Subjective Pt has no c/o of pain, just some soreness from "a lot of walking over the weekend"    Currently in Pain? No/denies              [facility] PT Assessment - 01/24/21 0001      AROM   AROM Assessment Site Knee    Right/Left Knee Left    Left Knee Flexion 98                         [facility] Adult PT Treatment/Exercise - 01/24/21 0001      Knee/Hip  Exercises: Aerobic   Recumbent Bike L4 x 10mins partial revs    Nustep L5 x 80mins      Knee/Hip Exercises: Standing   Forward Step Up Left;2 sets;10 reps;Step Height: 6"   step tap to 6" step, into step tap to 12"   Step Down 2 sets;10 reps;Hand Hold: 2;Step Height: 4";Step Height: 6"   eccentric step down, 1 set ea height   Wall Squat 2 sets;10 reps   limited ROM   Other Standing Knee Exercises stepping over foam roll with LLE only 3#      Knee/Hip Exercises: Seated   Long Arc Quad Left;2 sets;10 reps    Long Arc Quad Weight 3 lbs.      Manual Therapy   Manual Therapy Passive ROM    Manual therapy comments taken to end range and held, hold/relax    Passive ROM L knee flexion and ext                    PT Short Term Goals - 01/03/21 1417      PT SHORT TERM GOAL #1  Title Pt will be I with initial HEP    Time 2    Period Weeks    Status Achieved    Target Date 01/05/21             PT Long Term Goals - 01/19/21 1632      PT LONG TERM GOAL #1   Title Pt will demo gait without AD and with no major gait deviations    Status On-going      PT LONG TERM GOAL #2   Title Pt will demo knee flexion to 120 deg    Baseline 100 deg. PROM, 89 deg AROM    Status On-going      PT LONG TERM GOAL #3   Title Pt will demo full L knee extension    Baseline -5 deg PROM, -10 deg AROM    Status On-going      PT LONG TERM GOAL #4   Title Pt will demo TUG <15 sec with LRAD    Baseline 15.6 with SPC    Status On-going      PT LONG TERM GOAL #5   Title Pt will demo circumferential edema of L knee </= 2 cm diff from R knee    Status On-going                 Plan - 01/24/21 1400    Clinical Impression Statement Pt tolerated increase in functional strength and amb this session. Some cues needed to avoid circumduction with step tap to 12" step, to avoid lateral lean over RLE with LLE step down and right lateral lean with LLE step over foam roll. Progress made with hold  relax and PROM with increase in AROM to 98 degress flexion.    PT Treatment/Interventions ADLs/Self Care Home Management;Electrical Stimulation;Gait training;Stair training;Functional mobility training;Therapeutic activities;Therapeutic exercise;Balance training;Manual techniques;Patient/family education;Passive range of motion;Vasopneumatic Device    PT Next Visit Plan gait training with SPC, progress ROM/ functional strength ex's, gait training as tolerated.           Patient will benefit from skilled therapeutic intervention in order to improve the following deficits and impairments:  Abnormal gait,Pain,Decreased mobility,Decreased activity tolerance,Decreased range of motion,Decreased strength,Hypomobility,Difficulty walking,Increased edema  Visit Diagnosis: Acute pain of left knee  Stiffness of left knee, not elsewhere classified  Difficulty in walking, not elsewhere classified  Localized edema     Problem List Patient Active Problem List   Diagnosis Date Noted  . OA (osteoarthritis) of knee 12/19/2020  . Primary osteoarthritis of left knee 12/19/2020  . Obstructive sleep apnea 06/19/2019  . Malignant neoplasm of overlapping sites of right breast in female, estrogen receptor positive ([place]) 05/14/2019  . Coagulopathy ([place]) 05/14/2019  . Morbid obesity ([place]) 05/14/2019  . Chronic thromboembolic disease ([place]) 29/56/2130  . Pulmonary vascular disease ([place]) 04/08/2019  . Hypothyroidism 04/07/2019  . Essential hypertension 04/07/2019  . Pulmonary embolism ([place]) 04/06/2019  . Abnormal liver function 04/06/2019  . Hypokalemia 04/06/2019  . Incisional hernia 04/06/2016    Ernst Spell, SPTA 01/24/2021, 2:07 PM  [place]. [place], [place], [address] Phone: [phone]   Fax:  [phone]  Name: Cassandra [name] MRN: [id number] Date of Birth: December 12, 1945

## 2021-01-26 ENCOUNTER — Other Ambulatory Visit: Payer: Self-pay

## 2021-01-26 ENCOUNTER — Ambulatory Visit: Payer: Medicare HMO | Admitting: Physical Therapy

## 2021-01-26 ENCOUNTER — Encounter: Payer: Self-pay | Admitting: Physical Therapy

## 2021-01-26 DIAGNOSIS — M25662 Stiffness of left knee, not elsewhere classified: Secondary | ICD-10-CM

## 2021-01-26 DIAGNOSIS — R262 Difficulty in walking, not elsewhere classified: Secondary | ICD-10-CM | POA: Diagnosis not present

## 2021-01-26 DIAGNOSIS — M25562 Pain in left knee: Secondary | ICD-10-CM

## 2021-01-26 DIAGNOSIS — R600 Localized edema: Secondary | ICD-10-CM

## 2021-01-26 NOTE — Therapy (Signed)
[place]. [place], [place], [address] Phone: [phone]   Fax:  [phone]  Physical Therapy Treatment Progress Note Reporting Period 12/22/2020 to 01/26/2021  See note below for Objective Data and Assessment of Progress/Goals.      Patient Details  Name: Cassandra [name] MRN: [id number] Date of Birth: 08/24/1946 Referring Provider (PT): Aluisio   Encounter Date: 01/26/2021   PT End of Session - 01/26/21 8315    Visit Number 10    Number of Visits 12    Date for PT Re-Evaluation 03/16/21    PT Start Time 1761    PT Stop Time 1354    PT Time Calculation (min) 49 min    Activity Tolerance Patient tolerated treatment well    Behavior During Therapy [facility] for tasks assessed/performed           Past Medical History:  Diagnosis Date  . Arthritis   . Cancer [facility])    Breast cancer right '06- surgery, chemo, radiation- released after 5 yrs  . Dyspnea   . Hx of seasonal allergies    OTC med used  . Hypertension   . Hypothyroidism     Past Surgical History:  Procedure Laterality Date  . BREAST SURGERY Right    modified partial mastectomy/ lymph nodes dissection  . COLON SURGERY  2/17  . INCISIONAL HERNIA REPAIR N/A 04/06/2016   Procedure: LAPAROSCOPIC ASSISTED INCISIONAL HERNIA REPAIR WITH MESH;  Surgeon: Leighton Ruff, MD;  [facility];  Service: General;  Laterality: N/A;  . TONSILLECTOMY    . TOTAL KNEE ARTHROPLASTY Left 12/19/2020   Procedure: TOTAL KNEE ARTHROPLASTY;  Surgeon: Gaynelle Arabian, MD;  [facility];  Service: Orthopedics;  Laterality: Left;  32mn    There were no vitals filed for this visit.   Subjective Assessment - 01/26/21 1305    Subjective Pt states no pain, no new changes this rx    Currently in Pain? No/denies              [facility] 01[facility]26[facility] 0001      Circumferential Edema   Circumferential - Right 39.5 cm mid patella    Circumferential - Left  41.5  cm mid patella      AROM   AROM Assessment Site Knee    Right/Left Knee Left    Left Knee Extension 5    Left Knee Flexion 99      PROM   Left Knee Flexion 101      Timed Up and Go Test   TUG Comments 9.69 with SPC, 10.88 w/ no AD                         [facility] Adult PT Treatment/Exercise - 01/26/21 0001      Transfers   Five time sit to stand comments  14.85 sec with UEs pushing from thighs, 13.84 sec no UE support      Knee/Hip Exercises: Aerobic   Recumbent Bike x5 min full revolution seat position 8   excessive lateral lean   Nustep L5 x 6 min      Knee/Hip Exercises: Machines for Strengthening   Cybex Knee Extension 5# 1x10 BLE, 5# 2x5 LLE    Cybex Knee Flexion 20# x10 BLE, 10# 1x10 LLE    Total Gym Leg Press no weight x10 with 5 sec end range hold; 20# 2x10 BLE      Manual Therapy  Manual Therapy Passive ROM    Manual therapy comments taken to end range and held, hold/relax    Passive ROM L knee flexion and ext                    PT Short Term Goals - 01/03/21 1417      PT SHORT TERM GOAL #1   Title Pt will be I with initial HEP    Time 2    Period Weeks    Status Achieved    Target Date 01/05/21             PT Long Term Goals - 01/26/21 1325      PT LONG TERM GOAL #1   Title Pt will demo gait without AD and with no major gait deviations    Baseline gait with SPC    Status Partially Met      PT LONG TERM GOAL #2   Title Pt will demo knee flexion to 120 deg    Baseline 101 deg. PROM, 99 deg AROM    Status Partially Met      PT LONG TERM GOAL #3   Title Pt will demo full L knee extension    Baseline -5 deg AROM    Status Partially Met      PT LONG TERM GOAL #4   Title Pt will demo TUG <15 sec with LRAD    Baseline 9.69 with SPC, 10.88 w/ no AD    Status Achieved      PT LONG TERM GOAL #5   Title Pt will demo circumferential edema of L knee </= 2 cm diff from R knee    Baseline R knee: 39.5 cm, L knee: 41.5 cm     Status Achieved                 Plan - 01/26/21 1354    Clinical Impression Statement Pt making progress toward LTGs. Demos improved knee flexion ROM, TUG time <15 sec, and improved knee extension ROM. Still has significant ROM deficits along with gait with widened BOS and decreased knee flexion through stance phase. Pt tolerated progression to knee extension/flexion machines well with cues for TKE and increased ROM. Continue to progress to tolerance.    PT Treatment/Interventions ADLs/Self Care Home Management;Electrical Stimulation;Gait training;Stair training;Functional mobility training;Therapeutic activities;Therapeutic exercise;Balance training;Manual techniques;Patient/family education;Passive range of motion;Vasopneumatic Device    PT Next Visit Plan gait training, progress ROM/ functional strength ex's, gait training    Consulted and Agree with Plan of Care Patient           Patient will benefit from skilled therapeutic intervention in order to improve the following deficits and impairments:  Abnormal gait,Pain,Decreased mobility,Decreased activity tolerance,Decreased range of motion,Decreased strength,Hypomobility,Difficulty walking,Increased edema  Visit Diagnosis: Acute pain of left knee  Stiffness of left knee, not elsewhere classified  Difficulty in walking, not elsewhere classified  Localized edema     Problem List Patient Active Problem List   Diagnosis Date Noted  . OA (osteoarthritis) of knee 12/19/2020  . Primary osteoarthritis of left knee 12/19/2020  . Obstructive sleep apnea 06/19/2019  . Malignant neoplasm of overlapping sites of right breast in female, estrogen receptor positive ([place]) 05/14/2019  . Coagulopathy ([place]) 05/14/2019  . Morbid obesity ([place]) 05/14/2019  . Chronic thromboembolic disease ([place]) 99/37/1696  . Pulmonary vascular disease ([place]) 04/08/2019  . Hypothyroidism 04/07/2019  . Essential hypertension 04/07/2019  . Pulmonary embolism  ([place]) 04/06/2019  . Abnormal liver function  04/06/2019  . Hypokalemia 04/06/2019  . Incisional hernia 04/06/2016   Amador Cunas, PT, DPT Donald Prose Eligio Angert 01/26/2021, 2:02 PM  [place]. [place], [place], [address] Phone: [phone]   Fax:  [phone]  Name: DACIA CAPERS MRN: [id number] Date of Birth: [birth year]

## 2021-01-31 ENCOUNTER — Ambulatory Visit: Payer: Medicare HMO | Admitting: Physical Therapy

## 2021-01-31 ENCOUNTER — Other Ambulatory Visit: Payer: Self-pay

## 2021-01-31 DIAGNOSIS — M25662 Stiffness of left knee, not elsewhere classified: Secondary | ICD-10-CM

## 2021-01-31 DIAGNOSIS — R262 Difficulty in walking, not elsewhere classified: Secondary | ICD-10-CM | POA: Diagnosis not present

## 2021-01-31 DIAGNOSIS — R600 Localized edema: Secondary | ICD-10-CM | POA: Diagnosis not present

## 2021-01-31 DIAGNOSIS — M25562 Pain in left knee: Secondary | ICD-10-CM | POA: Diagnosis not present

## 2021-01-31 NOTE — Therapy (Signed)
[place]. [place], [place], [address] Phone: [phone]   Fax:  [phone]  Physical Therapy Treatment  Patient Details  Name: Cassandra [name] MRN: [id number] Date of Birth: 1946/04/10 Referring Provider (PT): Aluisio   Encounter Date: 01/31/2021   PT End of Session - 01/31/21 6761    Visit Number 11    Date for PT Re-Evaluation 03/16/21    PT Start Time 9509    PT Stop Time 1401    PT Time Calculation (min) 49 min           Past Medical History:  Diagnosis Date  . Arthritis   . Cancer [facility])    Breast cancer right '06- surgery, chemo, radiation- released after 5 yrs  . Dyspnea   . Hx of seasonal allergies    OTC med used  . Hypertension   . Hypothyroidism     Past Surgical History:  Procedure Laterality Date  . BREAST SURGERY Right    modified partial mastectomy/ lymph nodes dissection  . COLON SURGERY  2/17  . INCISIONAL HERNIA REPAIR N/A 04/06/2016   Procedure: LAPAROSCOPIC ASSISTED INCISIONAL HERNIA REPAIR WITH MESH;  Surgeon: Leighton Ruff, MD;  [facility];  Service: General;  Laterality: N/A;  . TONSILLECTOMY    . TOTAL KNEE ARTHROPLASTY Left 12/19/2020   Procedure: TOTAL KNEE ARTHROPLASTY;  Surgeon: Gaynelle Arabian, MD;  [facility];  Service: Orthopedics;  Laterality: Left;  58mn    There were no vitals filed for this visit.   Subjective Assessment - 01/31/21 1318    Subjective walking around at home without AD, using SPC outside home. standing harder than walking. swelling makes me so tight depending on what I do    Currently in Pain? Yes    Pain Score 2     Pain Location Knee    Pain Orientation Left              [facility] PT Assessment - 01/31/21 0001      AROM   AROM Assessment Site Knee    Right/Left Knee Left    Left Knee Extension 3    Left Knee Flexion 105                         [facility] Adult PT Treatment/Exercise - 01/31/21 0001       Knee/Hip Exercises: Aerobic   Recumbent Bike x5 min full revolution seat position 8    Nustep L5 x 6 min      Knee/Hip Exercises: Machines for Strengthening   Total Gym Leg Press 20# BIL 10x, LE only unlocked 10x. LLE 20# 2 sets 5 seat on 7      Knee/Hip Exercises: Standing   Forward Step Up Left;15 reps;Hand Hold: 2;Step Height: 6"    Step Down Left;15 reps;Hand Hold: 2;Step Height: 6"    Walking with Sports Cord 30# 5x fwd/back and 3x each side      Knee/Hip Exercises: Seated   Long Arc Quad Strengthening;Left;2 sets;10 reps   green tband ho;d 3 sec in TKE   Other Seated Knee/Hip Exercises TKE green tband 2 sets 10    Hamstring Curl Left;2 sets;10 reps    Hamstring Limitations green TB    Sit to Sand with UE support;10 reps                    PT Short Term Goals - 01/03/21 1417  PT SHORT TERM GOAL #1   Title Pt will be I with initial HEP    Time 2    Period Weeks    Status Achieved    Target Date 01/05/21             PT Long Term Goals - 01/26/21 1325      PT LONG TERM GOAL #1   Title Pt will demo gait without AD and with no major gait deviations    Baseline gait with SPC    Status Partially Met      PT LONG TERM GOAL #2   Title Pt will demo knee flexion to 120 deg    Baseline 101 deg. PROM, 99 deg AROM    Status Partially Met      PT LONG TERM GOAL #3   Title Pt will demo full L knee extension    Baseline -5 deg AROM    Status Partially Met      PT LONG TERM GOAL #4   Title Pt will demo TUG <15 sec with LRAD    Baseline 9.69 with SPC, 10.88 w/ no AD    Status Achieved      PT LONG TERM GOAL #5   Title Pt will demo circumferential edema of L knee </= 2 cm diff from R knee    Baseline R knee: 39.5 cm, L knee: 41.5 cm    Status Achieved                 Plan - 01/31/21 1359    Clinical Impression Statement AROM progressing . cued with ex to achieve and hold TKE. cued to use full ROM with flexion.educ on elevation after activity for  swelling    PT Treatment/Interventions ADLs/Self Care Home Management;Electrical Stimulation;Gait training;Stair training;Functional mobility training;Therapeutic activities;Therapeutic exercise;Balance training;Manual techniques;Patient/family education;Passive range of motion;Vasopneumatic Device    PT Next Visit Plan check goals, assure ex for HEP ( pending [employer] approval)           Patient will benefit from skilled therapeutic intervention in order to improve the following deficits and impairments:  Abnormal gait,Pain,Decreased mobility,Decreased activity tolerance,Decreased range of motion,Decreased strength,Hypomobility,Difficulty walking,Increased edema  Visit Diagnosis: Stiffness of left knee, not elsewhere classified  Difficulty in walking, not elsewhere classified     Problem List Patient Active Problem List   Diagnosis Date Noted  . OA (osteoarthritis) of knee 12/19/2020  . Primary osteoarthritis of left knee 12/19/2020  . Obstructive sleep apnea 06/19/2019  . Malignant neoplasm of overlapping sites of right breast in female, estrogen receptor positive ([place]) 05/14/2019  . Coagulopathy ([place]) 05/14/2019  . Morbid obesity ([place]) 05/14/2019  . Chronic thromboembolic disease ([place]) 44/45/8483  . Pulmonary vascular disease ([place]) 04/08/2019  . Hypothyroidism 04/07/2019  . Essential hypertension 04/07/2019  . Pulmonary embolism ([place]) 04/06/2019  . Abnormal liver function 04/06/2019  . Hypokalemia 04/06/2019  . Incisional hernia 04/06/2016    Valerie Cones,ANGIE PTA 01/31/2021, 2:03 PM  [place]. [place], [place], [address] Phone: [phone]   Fax:  [phone]  Name: ADDELINE CALARCO MRN: [id number] Date of Birth: 01-21-1946

## 2021-02-02 ENCOUNTER — Other Ambulatory Visit: Payer: Self-pay

## 2021-02-02 ENCOUNTER — Ambulatory Visit: Payer: Medicare HMO | Admitting: Physical Therapy

## 2021-02-02 ENCOUNTER — Encounter: Payer: Self-pay | Admitting: Physical Therapy

## 2021-02-02 DIAGNOSIS — R262 Difficulty in walking, not elsewhere classified: Secondary | ICD-10-CM

## 2021-02-02 DIAGNOSIS — R600 Localized edema: Secondary | ICD-10-CM | POA: Diagnosis not present

## 2021-02-02 DIAGNOSIS — M25662 Stiffness of left knee, not elsewhere classified: Secondary | ICD-10-CM | POA: Diagnosis not present

## 2021-02-02 DIAGNOSIS — M25562 Pain in left knee: Secondary | ICD-10-CM

## 2021-02-02 NOTE — Therapy (Signed)
[place]. [place], [place], [address] Phone: [phone]   Fax:  [phone]  Physical Therapy Treatment  Patient Details  Name: Cassandra [name] MRN: [id number] Date of Birth: Feb 06, 1946 Referring Provider (PT): Aluisio   Encounter Date: 02/02/2021   PT End of Session - 02/02/21 1400    Visit Number 12    Date for PT Re-Evaluation 03/16/21    Authorization Type submitted new [employer] form today for 12 more visits    PT Start Time 1314    PT Stop Time 1359    PT Time Calculation (min) 45 min    Activity Tolerance Patient tolerated treatment well    Behavior During Therapy [facility] for tasks assessed/performed           Past Medical History:  Diagnosis Date  . Arthritis   . Cancer [facility])    Breast cancer right '06- surgery, chemo, radiation- released after 5 yrs  . Dyspnea   . Hx of seasonal allergies    OTC med used  . Hypertension   . Hypothyroidism     Past Surgical History:  Procedure Laterality Date  . BREAST SURGERY Right    modified partial mastectomy/ lymph nodes dissection  . COLON SURGERY  2/17  . INCISIONAL HERNIA REPAIR N/A 04/06/2016   Procedure: LAPAROSCOPIC ASSISTED INCISIONAL HERNIA REPAIR WITH MESH;  Surgeon: Leighton Ruff, MD;  [facility];  Service: General;  Laterality: N/A;  . TONSILLECTOMY    . TOTAL KNEE ARTHROPLASTY Left 12/19/2020   Procedure: TOTAL KNEE ARTHROPLASTY;  Surgeon: Gaynelle Arabian, MD;  [facility];  Service: Orthopedics;  Laterality: Left;  77mn    There were no vitals filed for this visit.   Subjective Assessment - 02/02/21 1320    Subjective Pt reports now new changes since last rx.    Currently in Pain? Yes    Pain Score 2     Pain Location Knee    Pain Orientation Left                             [facility] Adult PT Treatment/Exercise - 02/02/21 0001      Knee/Hip Exercises: Stretches   Gastroc Stretch Both;1 rep;30 seconds       Knee/Hip Exercises: Aerobic   Recumbent Bike x5 min full revolution seat position 8    Nustep L5 x 6 min      Knee/Hip Exercises: Machines for Strengthening   Cybex Knee Extension 5# 1x10 BLE, 5# 2x5 LLE    Cybex Knee Flexion 20# x10 BLE, 10# 1x10 LLE    Total Gym Leg Press 20# BIL 10x, LE only unlocked 10x. LLE 20# 2 sets 5 seat on 7      Knee/Hip Exercises: Standing   Heel Raises Both;1 set;15 reps    Lateral Step Up Both;1 set;10 reps;Hand Hold: 1;Step Height: 6"    Forward Step Up Left;15 reps;Step Height: 6";Hand Hold: 1    Step Down Left;15 reps;Step Height: 6";Hand Hold: 1    Walking with Sports Cord 30# 5x fwd/back and 3x each side                    PT Short Term Goals - 01/03/21 1417      PT SHORT TERM GOAL #1   Title Pt will be I with initial HEP    Time 2    Period Weeks  Status Achieved    Target Date 01/05/21             PT Long Term Goals - 02/02/21 1402      PT LONG TERM GOAL #1   Title Pt will demo gait without AD and with no major gait deviations    Baseline gait with no AD but demos widened BOS, decreased step length, and trendelenburg    Status Partially Met      PT LONG TERM GOAL #2   Title Pt will demo knee flexion to 120 deg    Baseline 105    Status Partially Met      PT LONG TERM GOAL #3   Title Pt will demo full L knee extension    Baseline -5 deg AROM    Status Partially Met      PT LONG TERM GOAL #4   Title Pt will demo TUG <15 sec with LRAD    Baseline 9.69 with SPC, 10.88 w/ no AD    Status Achieved      PT LONG TERM GOAL #5   Title Pt will demo circumferential edema of L knee </= 2 cm diff from R knee    Baseline R knee: 39.5 cm, L knee: 41.5 cm    Status Achieved                 Plan - 02/02/21 1401    Clinical Impression Statement Pt tolerated progression of treatment well today. Cues for TKE with knee extension machine. Ambulation around clinic without AD; demos widened BOS with trendelenburg and uneven  step length. Working on increased step length/balance with resisted gait, occasional CGA required. Continue to progress to tolerance.    PT Treatment/Interventions ADLs/Self Care Home Management;Electrical Stimulation;Gait training;Stair training;Functional mobility training;Therapeutic activities;Therapeutic exercise;Balance training;Manual techniques;Patient/family education;Passive range of motion;Vasopneumatic Device    PT Next Visit Plan updated HEP, continue to progress ROM/function    Consulted and Agree with Plan of Care Patient           Patient will benefit from skilled therapeutic intervention in order to improve the following deficits and impairments:  Abnormal gait,Pain,Decreased mobility,Decreased activity tolerance,Decreased range of motion,Decreased strength,Hypomobility,Difficulty walking,Increased edema  Visit Diagnosis: Stiffness of left knee, not elsewhere classified  Difficulty in walking, not elsewhere classified  Acute pain of left knee  Localized edema     Problem List Patient Active Problem List   Diagnosis Date Noted  . OA (osteoarthritis) of knee 12/19/2020  . Primary osteoarthritis of left knee 12/19/2020  . Obstructive sleep apnea 06/19/2019  . Malignant neoplasm of overlapping sites of right breast in female, estrogen receptor positive ([place]) 05/14/2019  . Coagulopathy ([place]) 05/14/2019  . Morbid obesity ([place]) 05/14/2019  . Chronic thromboembolic disease ([place]) 77/93/9030  . Pulmonary vascular disease ([place]) 04/08/2019  . Hypothyroidism 04/07/2019  . Essential hypertension 04/07/2019  . Pulmonary embolism ([place]) 04/06/2019  . Abnormal liver function 04/06/2019  . Hypokalemia 04/06/2019  . Incisional hernia 04/06/2016   Amador Cunas, PT, DPT Donald Prose Erby Sanderson 02/02/2021, 2:13 PM  [place]. [place], [place], [address] Phone: [phone]   Fax:  [phone]  Name: Cassandra [name] MRN: [id number] Date of Birth: August 08, 1946

## 2021-02-10 DIAGNOSIS — I2699 Other pulmonary embolism without acute cor pulmonale: Secondary | ICD-10-CM | POA: Diagnosis not present

## 2021-02-14 ENCOUNTER — Other Ambulatory Visit: Payer: Self-pay

## 2021-02-14 ENCOUNTER — Ambulatory Visit: Payer: Medicare HMO | Admitting: Physical Therapy

## 2021-02-14 DIAGNOSIS — M25662 Stiffness of left knee, not elsewhere classified: Secondary | ICD-10-CM

## 2021-02-14 DIAGNOSIS — R262 Difficulty in walking, not elsewhere classified: Secondary | ICD-10-CM

## 2021-02-14 DIAGNOSIS — R600 Localized edema: Secondary | ICD-10-CM | POA: Diagnosis not present

## 2021-02-14 DIAGNOSIS — M25562 Pain in left knee: Secondary | ICD-10-CM

## 2021-02-14 NOTE — Therapy (Signed)
[place]. [place], [place], [address] Phone: [phone]   Fax:  [phone]  Physical Therapy Treatment  Patient Details  Name: Cassandra [name] MRN: [id number] Date of Birth: 1946-01-07 Referring Provider (PT): Aluisio   Encounter Date: 02/14/2021   PT End of Session - 02/14/21 1234    Visit Number 13    Date for PT Re-Evaluation 03/16/21    Authorization Type 1/12 by 7/8    PT Start Time 1324    PT Stop Time 1235    PT Time Calculation (min) 50 min           Past Medical History:  Diagnosis Date  . Arthritis   . Cancer [facility])    Breast cancer right '06- surgery, chemo, radiation- released after 5 yrs  . Dyspnea   . Hx of seasonal allergies    OTC med used  . Hypertension   . Hypothyroidism     Past Surgical History:  Procedure Laterality Date  . BREAST SURGERY Right    modified partial mastectomy/ lymph nodes dissection  . COLON SURGERY  2/17  . INCISIONAL HERNIA REPAIR N/A 04/06/2016   Procedure: LAPAROSCOPIC ASSISTED INCISIONAL HERNIA REPAIR WITH MESH;  Surgeon: Leighton Ruff, MD;  [facility]: [facility];  Service: General;  Laterality: N/A;  . TONSILLECTOMY    . TOTAL KNEE ARTHROPLASTY Left 12/19/2020   Procedure: TOTAL KNEE ARTHROPLASTY;  Surgeon: Gaynelle Arabian, MD;  [facility]: [facility];  Service: Orthopedics;  Laterality: Left;  74mn    There were no vitals filed for this visit.   Subjective Assessment - 02/14/21 1146    Subjective stiff and sore. I can tell its better because I am aware of how bad other knee is    Currently in Pain? Yes    Pain Score 2     Pain Location Knee    Pain Orientation Left              [facility] PT Assessment - 02/14/21 0001      AROM   Left Knee Flexion 109   end of session                        [facility] Treatment/Exercise - 02/14/21 0001      Knee/Hip Exercises: Aerobic   Recumbent Bike x5 min full revolution seat position 7     Nustep L5 x 6 min   LE only     Knee/Hip Exercises: Machines for Strengthening   Cybex Knee Extension 10# BLE 2 sets 10, 5# SL 10 each    Cybex Knee Flexion 20# x10 BLE 2 sets, 10# 1x10 LLE    Total Gym Leg Press 20# BIL 10x, LE only unlocked 10x. LLE 20# 2 sets 5 seat on 7   calf raises 20# 2 sets 15     Knee/Hip Exercises: Seated   Other Seated Knee/Hip Exercises Fitter ext 2 sets 10    Hamstring Curl Strengthening;Left;2 sets;10 reps   FITTER   Sit to Sand 20 reps;without UE support   on airex with green tbnad for TKE behind Left knee                   PT Short Term Goals - 01/03/21 1417      PT SHORT TERM GOAL #1   Title Pt will be I with initial HEP    Time 2    Period Weeks  Status Achieved    Target Date 01/05/21             PT Long Term Goals - 02/02/21 1402      PT LONG TERM GOAL #1   Title Pt will demo gait without AD and with no major gait deviations    Baseline gait with no AD but demos widened BOS, decreased step length, and trendelenburg    Status Partially Met      PT LONG TERM GOAL #2   Title Pt will demo knee flexion to 120 deg    Baseline 105    Status Partially Met      PT LONG TERM GOAL #3   Title Pt will demo full L knee extension    Baseline -5 deg AROM    Status Partially Met      PT LONG TERM GOAL #4   Title Pt will demo TUG <15 sec with LRAD    Baseline 9.69 with SPC, 10.88 w/ no AD    Status Achieved      PT LONG TERM GOAL #5   Title Pt will demo circumferential edema of L knee </= 2 cm diff from R knee    Baseline R knee: 39.5 cm, L knee: 41.5 cm    Status Achieved                 Plan - 02/14/21 1235    Clinical Impression Statement educ on ex and handout issued for gym transition. focus session on safety and setting up machines. explained 12 more visits auth by 7/8. 109 degrees flexion at end of session    PT Treatment/Interventions ADLs/Self Care Home Management;Electrical Stimulation;Gait training;Stair  training;Functional mobility training;Therapeutic activities;Therapeutic exercise;Balance training;Manual techniques;Patient/family education;Passive range of motion;Vasopneumatic Device    PT Next Visit Plan progress func and ROM           Patient will benefit from skilled therapeutic intervention in order to improve the following deficits and impairments:  Abnormal gait,Pain,Decreased mobility,Decreased activity tolerance,Decreased range of motion,Decreased strength,Hypomobility,Difficulty walking,Increased edema  Visit Diagnosis: Stiffness of left knee, not elsewhere classified  Difficulty in walking, not elsewhere classified  Acute pain of left knee     Problem List Patient Active Problem List   Diagnosis Date Noted  . OA (osteoarthritis) of knee 12/19/2020  . Primary osteoarthritis of left knee 12/19/2020  . Obstructive sleep apnea 06/19/2019  . Malignant neoplasm of overlapping sites of right breast in female, estrogen receptor positive ([place]) 05/14/2019  . Coagulopathy ([place]) 05/14/2019  . Morbid obesity ([place]) 05/14/2019  . Chronic thromboembolic disease ([place]) 16/83/7290  . Pulmonary vascular disease ([place]) 04/08/2019  . Hypothyroidism 04/07/2019  . Essential hypertension 04/07/2019  . Pulmonary embolism ([place]) 04/06/2019  . Abnormal liver function 04/06/2019  . Hypokalemia 04/06/2019  . Incisional hernia 04/06/2016    Yasser Hepp,ANGIE PTA 02/14/2021, 12:39 PM  [place]. [place], [place], [address] Phone: [phone]   Fax:  [phone]  Name: Cassandra [name] MRN: [id number] Date of Birth: 02/28/1946

## 2021-02-16 ENCOUNTER — Ambulatory Visit: Payer: Medicare HMO | Admitting: Physical Therapy

## 2021-02-16 ENCOUNTER — Other Ambulatory Visit: Payer: Self-pay

## 2021-02-16 DIAGNOSIS — M25662 Stiffness of left knee, not elsewhere classified: Secondary | ICD-10-CM | POA: Diagnosis not present

## 2021-02-16 DIAGNOSIS — M25562 Pain in left knee: Secondary | ICD-10-CM | POA: Diagnosis not present

## 2021-02-16 DIAGNOSIS — R262 Difficulty in walking, not elsewhere classified: Secondary | ICD-10-CM

## 2021-02-16 DIAGNOSIS — R600 Localized edema: Secondary | ICD-10-CM | POA: Diagnosis not present

## 2021-02-16 NOTE — Therapy (Signed)
[facility] [address] [place], [place], [address] Phone: [phone]   Fax:  [phone]  Physical Therapy Treatment  Patient Details  Name: Cassandra [name] MRN: [id number] Date of Birth: 04/01/1946 Referring Provider (PT): Aluisio   Encounter Date: 02/16/2021   PT End of Session - 02/16/21 1338    Visit Number 14    Date for PT Re-Evaluation 03/16/21    PT Start Time 1145    PT Stop Time 1230    PT Time Calculation (min) 45 min           Past Medical History:  Diagnosis Date  . Arthritis   . Cancer (HCC)    Breast cancer right '06- surgery, chemo, radiation- released after 5 yrs  . Dyspnea   . Hx of seasonal allergies    OTC med used  . Hypertension   . Hypothyroidism     Past Surgical History:  Procedure Laterality Date  . BREAST SURGERY Right    modified partial mastectomy/ lymph nodes dissection  . COLON SURGERY  2/17  . INCISIONAL HERNIA REPAIR N/A 04/06/2016   Procedure: LAPAROSCOPIC ASSISTED INCISIONAL HERNIA REPAIR WITH MESH;  Surgeon: Alicia Thomas, MD;  [facility];  Service: General;  Laterality: N/A;  . TONSILLECTOMY    . TOTAL KNEE ARTHROPLASTY Left 12/19/2020   Procedure: TOTAL KNEE ARTHROPLASTY;  Surgeon: Aluisio, Frank, MD;  [facility];  Service: Orthopedics;  Laterality: Left;  50min    There were no vitals filed for this visit.   Subjective Assessment - 02/16/21 1146    Subjective just stiffness, I can tell day after therapy    Currently in Pain? Yes    Pain Score 2     Pain Location Knee              [facility] PT Assessment - 02/16/21 0001      AROM   Left Knee Extension 0    Left Knee Flexion 110                         [facility] Adult PT Treatment/Exercise - 02/16/21 0001      Knee/Hip Exercises: Aerobic   Recumbent Bike x5 min full revolution seat position 7    Nustep L5 x 6 min   LE only     Knee/Hip Exercises: Machines for Strengthening   Total  Gym Leg Press 30# 2 sets 10 (P)    unlocked for 10 for ROM LLE only     Knee/Hip Exercises: Standing   Knee Flexion Strengthening;Left;15 reps    Walking with Sports Cord 30# 5x fwd/back and 3x each side   LLE cable press down 15 x 30#   Other Standing Knee Exercises 4 inch step up/down/reverse 15 x UE support    Other Standing Knee Exercises stepping laterally over rolls on airex min A      Knee/Hip Exercises: Seated   Long Arc Quad Strengthening;Left;15 reps   5#   Sit to Sand 10 reps;without UE support   on airex                   PT Short Term Goals - 01/03/21 1417      PT SHORT TERM GOAL #1   Title Pt will be I with initial HEP    Time 2    Period Weeks    Status Achieved    Target Date 01/05/21               PT Long Term Goals - 02/02/21 1402      PT LONG TERM GOAL #1   Title Pt will demo gait without AD and with no major gait deviations    Baseline gait with no AD but demos widened BOS, decreased step length, and trendelenburg    Status Partially Met      PT LONG TERM GOAL #2   Title Pt will demo knee flexion to 120 deg    Baseline 105    Status Partially Met      PT LONG TERM GOAL #3   Title Pt will demo full L knee extension    Baseline -5 deg AROM    Status Partially Met      PT LONG TERM GOAL #4   Title Pt will demo TUG <15 sec with LRAD    Baseline 9.69 with SPC, 10.88 w/ no AD    Status Achieved      PT LONG TERM GOAL #5   Title Pt will demo circumferential edema of L knee </= 2 cm diff from R knee    Baseline R knee: 39.5 cm, L knee: 41.5 cm    Status Achieved                 Plan - 02/16/21 1339    Clinical Impression Statement decreased stablity with stepping on dynamci surfaces- needed cuinga nd physical assist. tolerated progression of ex well. Swelling still significant so act flexion is somewhat limited but AROM sitting was 0-110. overall pt is progressing well towards goals    PT Treatment/Interventions ADLs/Self Care Home  Management;Electrical Stimulation;Gait training;Stair training;Functional mobility training;Therapeutic activities;Therapeutic exercise;Balance training;Manual techniques;Patient/family education;Passive range of motion;Vasopneumatic Device    PT Next Visit Plan progress func and ROM           Patient will benefit from skilled therapeutic intervention in order to improve the following deficits and impairments:  Abnormal gait,Pain,Decreased mobility,Decreased activity tolerance,Decreased range of motion,Decreased strength,Hypomobility,Difficulty walking,Increased edema  Visit Diagnosis: Difficulty in walking, not elsewhere classified  Stiffness of left knee, not elsewhere classified     Problem List Patient Active Problem List   Diagnosis Date Noted  . OA (osteoarthritis) of knee 12/19/2020  . Primary osteoarthritis of left knee 12/19/2020  . Obstructive sleep apnea 06/19/2019  . Malignant neoplasm of overlapping sites of right breast in female, estrogen receptor positive (HCC) 05/14/2019  . Coagulopathy (HCC) 05/14/2019  . Morbid obesity (HCC) 05/14/2019  . Chronic thromboembolic disease (HCC) 04/08/2019  . Pulmonary vascular disease (HCC) 04/08/2019  . Hypothyroidism 04/07/2019  . Essential hypertension 04/07/2019  . Pulmonary embolism (HCC) 04/06/2019  . Abnormal liver function 04/06/2019  . Hypokalemia 04/06/2019  . Incisional hernia 04/06/2016    PAYSEUR,ANGIE PTA 02/16/2021, 1:40 PM  [facility] [address] [place], [place], [address] Phone: [phone]   Fax:  [phone]  Name: Goldye A Seeney MRN: [id number] Date of Birth: 08/21/1946   

## 2021-02-21 ENCOUNTER — Other Ambulatory Visit: Payer: Self-pay

## 2021-02-21 ENCOUNTER — Ambulatory Visit: Payer: Medicare HMO | Admitting: Physical Therapy

## 2021-02-21 DIAGNOSIS — R600 Localized edema: Secondary | ICD-10-CM | POA: Diagnosis not present

## 2021-02-21 DIAGNOSIS — R262 Difficulty in walking, not elsewhere classified: Secondary | ICD-10-CM

## 2021-02-21 DIAGNOSIS — M25662 Stiffness of left knee, not elsewhere classified: Secondary | ICD-10-CM | POA: Diagnosis not present

## 2021-02-21 DIAGNOSIS — M25562 Pain in left knee: Secondary | ICD-10-CM | POA: Diagnosis not present

## 2021-02-21 NOTE — Therapy (Signed)
[place]. [place], [place], [address] Phone: [phone]   Fax:  [phone]  Physical Therapy Treatment  Patient Details  Name: Cassandra [name] MRN: [id number] Date of Birth: 1946-02-02 Referring Provider (PT): Aluisio   Encounter Date: 02/21/2021   PT End of Session - 02/21/21 1347    Visit Number 15    PT Start Time 1310    PT Stop Time 2751    PT Time Calculation (min) 45 min           Past Medical History:  Diagnosis Date  . Arthritis   . Cancer [facility])    Breast cancer right '06- surgery, chemo, radiation- released after 5 yrs  . Dyspnea   . Hx of seasonal allergies    OTC med used  . Hypertension   . Hypothyroidism     Past Surgical History:  Procedure Laterality Date  . BREAST SURGERY Right    modified partial mastectomy/ lymph nodes dissection  . COLON SURGERY  2/17  . INCISIONAL HERNIA REPAIR N/A 04/06/2016   Procedure: LAPAROSCOPIC ASSISTED INCISIONAL HERNIA REPAIR WITH MESH;  Surgeon: Leighton Ruff, MD;  [facility];  Service: General;  Laterality: N/A;  . TONSILLECTOMY    . TOTAL KNEE ARTHROPLASTY Left 12/19/2020   Procedure: TOTAL KNEE ARTHROPLASTY;  Surgeon: Gaynelle Arabian, MD;  [facility]: [facility];  Service: Orthopedics;  Laterality: Left;  70mn    There were no vitals filed for this visit.   Subjective Assessment - 02/21/21 1310    Subjective stiff and sore as usual    Currently in Pain? Yes    Pain Score 2     Pain Location Knee    Pain Orientation Left                             [facility] Adult PT Treatment/Exercise - 02/21/21 0001      Knee/Hip Exercises: Aerobic   Recumbent Bike x5 min full revolution seat position 7    Nustep L5 x 6 min   LE only     Knee/Hip Exercises: Machines for Strengthening   Cybex Knee Extension 10# BLE 2 sets 10, 5# SL 10 each    Cybex Knee Flexion 25# 2 set s10 BIL   LLE only 20# 10 x RT LE 10x 20#   Cybex Leg Press calf  raises 30# 2 sets 12    Total Gym Leg Press 30# 2 sets 10   unlocked 10 x for ROM     Knee/Hip Exercises: Standing   Forward Step Up Left;15 reps;Step Height: 6";Hand Hold: 1    Step Down Left;15 reps;Step Height: 6";Hand Hold: 1    Walking with Sports Cord 30#3 x fwd with L step up 6 inch, 2 x with RT step up- very weak and gave away to stopeed. and 3x each side without step up    Other Standing Knee Exercises TKE with green tband 2 sets 10    Other Standing Knee Exercises stepping over foam roll fwd adn back 15 x and SW 15 X HHA with 5#      Knee/Hip Exercises: Seated   Long Arc Quad Strengthening;Left;10 reps;2 sets;Weights    Long Arc Quad Weight 5 lbs.    Hamstring Curl Strengthening;Left;2 sets;10 reps    Hamstring Limitations green TB      Manual Therapy   Manual Therapy Passive ROM    Passive ROM  with jt caspule stretch                    PT Short Term Goals - 01/03/21 1417      PT SHORT TERM GOAL #1   Title Pt will be I with initial HEP    Time 2    Period Weeks    Status Achieved    Target Date 01/05/21             PT Long Term Goals - 02/02/21 1402      PT LONG TERM GOAL #1   Title Pt will demo gait without AD and with no major gait deviations    Baseline gait with no AD but demos widened BOS, decreased step length, and trendelenburg    Status Partially Met      PT LONG TERM GOAL #2   Title Pt will demo knee flexion to 120 deg    Baseline 105    Status Partially Met      PT LONG TERM GOAL #3   Title Pt will demo full L knee extension    Baseline -5 deg AROM    Status Partially Met      PT LONG TERM GOAL #4   Title Pt will demo TUG <15 sec with LRAD    Baseline 9.69 with SPC, 10.88 w/ no AD    Status Achieved      PT LONG TERM GOAL #5   Title Pt will demo circumferential edema of L knee </= 2 cm diff from R knee    Baseline R knee: 39.5 cm, L knee: 41.5 cm    Status Achieved                 Plan - 02/21/21 1348    Clinical  Impression Statement instabilty with resisted giat and step up RT > Left. cued with ex for TKE and hold and end range. overall pt is doing very well.    PT Treatment/Interventions ADLs/Self Care Home Management;Electrical Stimulation;Gait training;Stair training;Functional mobility training;Therapeutic activities;Therapeutic exercise;Balance training;Manual techniques;Patient/family education;Passive range of motion;Vasopneumatic Device    PT Next Visit Plan wriet MD note           Patient will benefit from skilled therapeutic intervention in order to improve the following deficits and impairments:  Abnormal gait,Pain,Decreased mobility,Decreased activity tolerance,Decreased range of motion,Decreased strength,Hypomobility,Difficulty walking,Increased edema  Visit Diagnosis: Difficulty in walking, not elsewhere classified  Stiffness of left knee, not elsewhere classified     Problem List Patient Active Problem List   Diagnosis Date Noted  . OA (osteoarthritis) of knee 12/19/2020  . Primary osteoarthritis of left knee 12/19/2020  . Obstructive sleep apnea 06/19/2019  . Malignant neoplasm of overlapping sites of right breast in female, estrogen receptor positive ([place]) 05/14/2019  . Coagulopathy ([place]) 05/14/2019  . Morbid obesity ([place]) 05/14/2019  . Chronic thromboembolic disease ([place]) 38/45/3646  . Pulmonary vascular disease ([place]) 04/08/2019  . Hypothyroidism 04/07/2019  . Essential hypertension 04/07/2019  . Pulmonary embolism ([place]) 04/06/2019  . Abnormal liver function 04/06/2019  . Hypokalemia 04/06/2019  . Incisional hernia 04/06/2016    Quan Cybulski,ANGIE PTA 02/21/2021, 1:51 PM  [place]. [place], [place], [address] Phone: [phone]   Fax:  [phone]  Name: Cassandra [name] MRN: [id number] Date of Birth: 02-22-1946

## 2021-02-23 ENCOUNTER — Ambulatory Visit: Payer: Medicare HMO | Attending: Orthopedic Surgery | Admitting: Physical Therapy

## 2021-02-23 ENCOUNTER — Other Ambulatory Visit: Payer: Self-pay

## 2021-02-23 DIAGNOSIS — M25562 Pain in left knee: Secondary | ICD-10-CM | POA: Diagnosis not present

## 2021-02-23 DIAGNOSIS — R600 Localized edema: Secondary | ICD-10-CM | POA: Insufficient documentation

## 2021-02-23 DIAGNOSIS — M25662 Stiffness of left knee, not elsewhere classified: Secondary | ICD-10-CM | POA: Diagnosis not present

## 2021-02-23 DIAGNOSIS — R262 Difficulty in walking, not elsewhere classified: Secondary | ICD-10-CM | POA: Insufficient documentation

## 2021-02-23 NOTE — Therapy (Signed)
[place]. [place], [place], [address] Phone: [phone]   Fax:  [phone]  Physical Therapy Treatment  Patient Details  Name: Cassandra [name] MRN: [id number] Date of Birth: 1945/11/12 Referring Provider (PT): Aluisio   Encounter Date: 02/23/2021   PT End of Session - 02/23/21 1437    Visit Number 16    PT Start Time 1400    PT Stop Time 6962    PT Time Calculation (min) 45 min           Past Medical History:  Diagnosis Date  . Arthritis   . Cancer [facility])    Breast cancer right '06- surgery, chemo, radiation- released after 5 yrs  . Dyspnea   . Hx of seasonal allergies    OTC med used  . Hypertension   . Hypothyroidism     Past Surgical History:  Procedure Laterality Date  . BREAST SURGERY Right    modified partial mastectomy/ lymph nodes dissection  . COLON SURGERY  2/17  . INCISIONAL HERNIA REPAIR N/A 04/06/2016   Procedure: LAPAROSCOPIC ASSISTED INCISIONAL HERNIA REPAIR WITH MESH;  Surgeon: Leighton Ruff, MD;  [facility];  Service: General;  Laterality: N/A;  . TONSILLECTOMY    . TOTAL KNEE ARTHROPLASTY Left 12/19/2020   Procedure: TOTAL KNEE ARTHROPLASTY;  Surgeon: Gaynelle Arabian, MD;  [facility];  Service: Orthopedics;  Laterality: Left;  84mn    There were no vitals filed for this visit.   Subjective Assessment - 02/23/21 1406    Subjective no appts for next week, no availability so will try gym    Currently in Pain? Yes    Pain Score 2     Pain Location Knee    Pain Orientation Left              [facility] PT Assessment - 02/23/21 0001      AROM   Left Knee Extension 3   took at start of session, end of session was 0   Left Knee Flexion 110      Strength   Overall Strength Comments Left knee 5-/5 with func weakness noted with ex                         [facility]      Knee/Hip Exercises: Aerobic   Recumbent  Bike x5 min full revolution    Nustep L5 x 6 min   LE only     Knee/Hip Exercises: Machines for Strengthening   Cybex Knee Extension 10# BLE 2 sets 10, 5# SL 10 each    Cybex Knee Flexion 25# 2 set s10 BIL    Total Gym Leg Press 40# 2 sets 10      Knee/Hip Exercises: Standing   Heel Raises Both;2 sets;10 reps   black bar   Terminal Knee Extension Strengthening;Left;2 sets;10 reps    Theraband Level (Terminal Knee Extension) Level 3 (Green)    Functional Squat 10 reps   on dynamic surface   Other Standing Knee Exercises 8 inch fwd and side step tap 2 sets 10 5#      Knee/Hip Exercises: Seated   Long Arc Quad Strengthening;Left;2 sets;10 reps;Weights   hold 3 sec   Long Arc Quad Weight 5 lbs.    Hamstring Curl Strengthening;Left;2 sets;10 reps    Hamstring Limitations green TB  PT Short Term Goals - 01/03/21 1417      PT SHORT TERM GOAL #1   Title Pt will be I with initial HEP    Time 2    Period Weeks    Status Achieved    Target Date 01/05/21             PT Long Term Goals - 02/23/21 1414      PT LONG TERM GOAL #1   Title Pt will demo gait without AD and with no major gait deviations    Baseline some deficiets as RT knee needs replaced too    Status Partially Met      PT LONG TERM GOAL #2   Title Pt will demo knee flexion to 120 deg    Baseline 110    Status Partially Met      PT LONG TERM GOAL #3   Title Pt will demo full L knee extension    Baseline 3 from full ext    Status Partially Met                 Plan - 02/23/21 1438    Clinical Impression Statement progressing with goals, improved ROM and strength. working to increase flexion with func activity. gait is better but liited some by RT knee that needs replaced too.    PT Treatment/Interventions ADLs/Self Care Home Management;Electrical Stimulation;Gait training;Stair training;Functional mobility training;Therapeutic activities;Therapeutic exercise;Balance  training;Manual techniques;Patient/family education;Passive range of motion;Vasopneumatic Device    PT Next Visit Plan pt to try gym next and look D/C by end of month           Patient will benefit from skilled therapeutic intervention in order to improve the following deficits and impairments:  Abnormal gait,Pain,Decreased mobility,Decreased activity tolerance,Decreased range of motion,Decreased strength,Hypomobility,Difficulty walking,Increased edema  Visit Diagnosis: Difficulty in walking, not elsewhere classified  Stiffness of left knee, not elsewhere classified     Problem List Patient Active Problem List   Diagnosis Date Noted  . OA (osteoarthritis) of knee 12/19/2020  . Primary osteoarthritis of left knee 12/19/2020  . Obstructive sleep apnea 06/19/2019  . Malignant neoplasm of overlapping sites of right breast in female, estrogen receptor positive ([place]) 05/14/2019  . Coagulopathy ([place]) 05/14/2019  . Morbid obesity ([place]) 05/14/2019  . Chronic thromboembolic disease ([place]) 22/24/1146  . Pulmonary vascular disease ([place]) 04/08/2019  . Hypothyroidism 04/07/2019  . Essential hypertension 04/07/2019  . Pulmonary embolism ([place]) 04/06/2019  . Abnormal liver function 04/06/2019  . Hypokalemia 04/06/2019  . Incisional hernia 04/06/2016    Analiya Porco,ANGIE PTA 02/23/2021, 2:42 PM  [place]. [place], [place], [address] Phone: [phone]   Fax:  [phone]  Name: Cassandra [name] MRN: [id number] Date of Birth: [birth year]

## 2021-03-07 ENCOUNTER — Ambulatory Visit: Payer: Medicare HMO | Admitting: Physical Therapy

## 2021-03-07 ENCOUNTER — Other Ambulatory Visit: Payer: Self-pay

## 2021-03-07 ENCOUNTER — Encounter: Payer: Self-pay | Admitting: Physical Therapy

## 2021-03-07 DIAGNOSIS — R600 Localized edema: Secondary | ICD-10-CM

## 2021-03-07 DIAGNOSIS — M25662 Stiffness of left knee, not elsewhere classified: Secondary | ICD-10-CM

## 2021-03-07 DIAGNOSIS — R262 Difficulty in walking, not elsewhere classified: Secondary | ICD-10-CM | POA: Diagnosis not present

## 2021-03-07 DIAGNOSIS — M25562 Pain in left knee: Secondary | ICD-10-CM | POA: Diagnosis not present

## 2021-03-07 NOTE — Therapy (Signed)
[place]. [place], [place], [address] Phone: [phone]   Fax:  [phone]  Physical Therapy Treatment  Patient Details  Name: Cassandra [name] MRN: [id number] Date of Birth: [birth year] Referring Provider (PT): Aluisio   Encounter Date: 03/07/2021   PT End of Session - 03/07/21 1351     Visit Number 17    Date for PT Re-Evaluation 03/16/21    PT Start Time 1305    PT Stop Time 1344    PT Time Calculation (min) 39 min    Activity Tolerance Patient tolerated treatment well    Behavior During Therapy [facility] for tasks assessed/performed             Past Medical History:  Diagnosis Date   Arthritis    Cancer ([place])    Breast cancer right '06- surgery, chemo, radiation- released after 5 yrs   Dyspnea    Hx of seasonal allergies    OTC med used   Hypertension    Hypothyroidism     Past Surgical History:  Procedure Laterality Date   BREAST SURGERY Right    modified partial mastectomy/ lymph nodes dissection   COLON SURGERY  2/17   INCISIONAL HERNIA REPAIR N/A 04/06/2016   Procedure: LAPAROSCOPIC ASSISTED INCISIONAL HERNIA REPAIR WITH MESH;  Surgeon: Leighton Ruff, MD;  [facility]: [facility];  Service: General;  Laterality: N/A;   TONSILLECTOMY     TOTAL KNEE ARTHROPLASTY Left 12/19/2020   Procedure: TOTAL KNEE ARTHROPLASTY;  Surgeon: Gaynelle Arabian, MD;  Location: [facility];  Service: Orthopedics;  Laterality: Left;  70mn    There were no vitals filed for this visit.   Subjective Assessment - 03/07/21 1311     Subjective Pt reports that she was able to get to the gym 1x last week and states that workout went pretty well    Currently in Pain? Yes    Pain Score 3     Pain Location Knee    Pain Orientation Left                               [facility] Adult PT Treatment/Exercise - 03/07/21 0001       Knee/Hip Exercises: Stretches   Gastroc Stretch Both;1 rep;30 seconds      Knee/Hip  Exercises: Aerobic   Recumbent Bike x5 min full revolution    Nustep L5 x 6 min LE only      Knee/Hip Exercises: Machines for Strengthening   Cybex Knee Extension 10# BLE 2 sets 10, 5# SL 10 each    Cybex Knee Flexion 25# 2 sets 10 BIL    Cybex Leg Press calf raises 40# 2x10    Total Gym Leg Press 40# 2 sets 10      Knee/Hip Exercises: Standing   Heel Raises Both;2 sets;10 reps    Walking with Sports Cord 30# x4 each direction                      PT Short Term Goals - 01/03/21 1417       PT SHORT TERM GOAL #1   Title Pt will be I with initial HEP    Time 2    Period Weeks    Status Achieved    Target Date 01/05/21               PT Long Term Goals - 02/23/21 1414  PT LONG TERM GOAL #1   Title Pt will demo gait without AD and with no major gait deviations    Baseline some deficiets as RT knee needs replaced too    Status Partially Met      PT LONG TERM GOAL #2   Title Pt will demo knee flexion to 120 deg    Baseline 110    Status Partially Met      PT LONG TERM GOAL #3   Title Pt will demo full L knee extension    Baseline 3 from full ext    Status Partially Met                   Plan - 03/07/21 1355     Clinical Impression Statement Pt demos good tolerance to TE this rx. Reporting increased pain in R knee with standing ex's. Widened BOS and occasional LOB with resisted gait, able to self correct. Cues for TKE with knee extension machine. Continue to progress to tolerance.    PT Treatment/Interventions ADLs/Self Care Home Management;Electrical Stimulation;Gait training;Stair training;Functional mobility training;Therapeutic activities;Therapeutic exercise;Balance training;Manual techniques;Patient/family education;Passive range of motion;Vasopneumatic Device    PT Next Visit Plan pt to try gym next and look D/C by end of month    Consulted and Agree with Plan of Care Patient             Patient will benefit from skilled  therapeutic intervention in order to improve the following deficits and impairments:  Abnormal gait, Pain, Decreased mobility, Decreased activity tolerance, Decreased range of motion, Decreased strength, Hypomobility, Difficulty walking, Increased edema  Visit Diagnosis: Difficulty in walking, not elsewhere classified  Stiffness of left knee, not elsewhere classified  Acute pain of left knee  Localized edema     Problem List Patient Active Problem List   Diagnosis Date Noted   OA (osteoarthritis) of knee 12/19/2020   Primary osteoarthritis of left knee 12/19/2020   Obstructive sleep apnea 06/19/2019   Malignant neoplasm of overlapping sites of right breast in female, estrogen receptor positive ([place]) 05/14/2019   Coagulopathy ([place]) 05/14/2019   Morbid obesity ([place]) 05/14/2019   Chronic thromboembolic disease ([place]) 82/50/5397   Pulmonary vascular disease ([place]) 04/08/2019   Hypothyroidism 04/07/2019   Essential hypertension 04/07/2019   Pulmonary embolism ([place]) 04/06/2019   Abnormal liver function 04/06/2019   Hypokalemia 04/06/2019   Incisional hernia 04/06/2016   Cassandra [name], PT, DPT Cassandra [name] 03/07/2021, 1:56 PM  [place]. [place], [place], [address] Phone: [phone]   Fax:  [phone]  Name: Cassandra [name] MRN: [id number] Date of Birth: [birth year]

## 2021-03-09 ENCOUNTER — Encounter: Payer: Self-pay | Admitting: Physical Therapy

## 2021-03-09 ENCOUNTER — Ambulatory Visit: Payer: Medicare HMO | Admitting: Physical Therapy

## 2021-03-09 ENCOUNTER — Other Ambulatory Visit: Payer: Self-pay

## 2021-03-09 DIAGNOSIS — R262 Difficulty in walking, not elsewhere classified: Secondary | ICD-10-CM | POA: Diagnosis not present

## 2021-03-09 DIAGNOSIS — R600 Localized edema: Secondary | ICD-10-CM | POA: Diagnosis not present

## 2021-03-09 DIAGNOSIS — M25662 Stiffness of left knee, not elsewhere classified: Secondary | ICD-10-CM

## 2021-03-09 DIAGNOSIS — M25562 Pain in left knee: Secondary | ICD-10-CM | POA: Diagnosis not present

## 2021-03-09 NOTE — Therapy (Signed)
[place]. [place], [place], [address] Phone: [phone]   Fax:  [phone]  Physical Therapy Treatment  Patient Details  Name: Cassandra [name] MRN: [id number] Date of Birth: [birth year] Referring Provider (PT): Aluisio   Encounter Date: 03/09/2021   PT End of Session - 03/09/21 1342     Visit Number 18    Date for PT Re-Evaluation 03/16/21    PT Start Time 1308    PT Stop Time 1343    PT Time Calculation (min) 35 min    Activity Tolerance Patient tolerated treatment well    Behavior During Therapy [facility] for tasks assessed/performed             Past Medical History:  Diagnosis Date   Arthritis    Cancer ([place])    Breast cancer right '06- surgery, chemo, radiation- released after 5 yrs   Dyspnea    Hx of seasonal allergies    OTC med used   Hypertension    Hypothyroidism     Past Surgical History:  Procedure Laterality Date   BREAST SURGERY Right    modified partial mastectomy/ lymph nodes dissection   COLON SURGERY  2/17   INCISIONAL HERNIA REPAIR N/A 04/06/2016   Procedure: LAPAROSCOPIC ASSISTED INCISIONAL HERNIA REPAIR WITH MESH;  Surgeon: Leighton Ruff, MD;  [facility];  Service: General;  Laterality: N/A;   TONSILLECTOMY     TOTAL KNEE ARTHROPLASTY Left 12/19/2020   Procedure: TOTAL KNEE ARTHROPLASTY;  Surgeon: Gaynelle Arabian, MD;  [facility];  Service: Orthopedics;  Laterality: Left;  15mn    There were no vitals filed for this visit.   Subjective Assessment - 03/09/21 1306     Subjective Pt states no new changes this rx    Currently in Pain? No/denies    Pain Score 0-No pain                               [facility] Adult PT Treatment/Exercise - 03/09/21 0001       Knee/Hip Exercises: Aerobic   Recumbent Bike x 6 min full revolutions    Nustep L5 x 6 min LE only      Knee/Hip Exercises: Machines for Strengthening   Cybex Knee Extension 10# BLE 2 sets  10, 5# SL 10 each    Cybex Knee Flexion 25# 2 sets 10 BIL      Knee/Hip Exercises: Standing   Heel Raises Both;1 set;15 reps    Walking with Sports Cord 30# x4 each direction    Other Standing Knee Exercises 8" alt step taps x10 B    Other Standing Knee Exercises step ups fwd/lat 6" stair x5 B; HH +2 stepping up to RLE, no HH stepping to LLE                      PT Short Term Goals - 01/03/21 1417       PT SHORT TERM GOAL #1   Title Pt will be I with initial HEP    Time 2    Period Weeks    Status Achieved    Target Date 01/05/21               PT Long Term Goals - 02/23/21 1414       PT LONG TERM GOAL #1   Title Pt will demo gait without AD and with no major  gait deviations    Baseline some deficiets as RT knee needs replaced too    Status Partially Met      PT LONG TERM GOAL #2   Title Pt will demo knee flexion to 120 deg    Baseline 110    Status Partially Met      PT LONG TERM GOAL #3   Title Pt will demo full L knee extension    Baseline 3 from full ext    Status Partially Met                   Plan - 03/09/21 1343     Clinical Impression Statement Pt arrived ~8 min late, so abbreviated session. Pt requires cuing for narrowing BOS and equal step length with resisted gait. Able to self correct LOB. Cues to avoid hip circumduction with step ups. Starting to have more difficulty/pain with R knee; needed [facility] assist for step ups to RLE. Able to step up to LLE with no HH assist. Continue to progress to tolerance.    PT Treatment/Interventions ADLs/Self Care Home Management;Electrical Stimulation;Gait training;Stair training;Functional mobility training;Therapeutic activities;Therapeutic exercise;Balance training;Manual techniques;Patient/family education;Passive range of motion;Vasopneumatic Device    PT Next Visit Plan pt to try gym next and look D/C by end of month    Consulted and Agree with Plan of Care Patient             Patient  will benefit from skilled therapeutic intervention in order to improve the following deficits and impairments:  Abnormal gait, Pain, Decreased mobility, Decreased activity tolerance, Decreased range of motion, Decreased strength, Hypomobility, Difficulty walking, Increased edema  Visit Diagnosis: Difficulty in walking, not elsewhere classified  Stiffness of left knee, not elsewhere classified  Acute pain of left knee  Localized edema     Problem List Patient Active Problem List   Diagnosis Date Noted   OA (osteoarthritis) of knee 12/19/2020   Primary osteoarthritis of left knee 12/19/2020   Obstructive sleep apnea 06/19/2019   Malignant neoplasm of overlapping sites of right breast in female, estrogen receptor positive ([place]) 05/14/2019   Coagulopathy ([place]) 05/14/2019   Morbid obesity ([place]) 05/14/2019   Chronic thromboembolic disease ([place]) 63/33/5456   Pulmonary vascular disease ([place]) 04/08/2019   Hypothyroidism 04/07/2019   Essential hypertension 04/07/2019   Pulmonary embolism ([place]) 04/06/2019   Abnormal liver function 04/06/2019   Hypokalemia 04/06/2019   Incisional hernia 04/06/2016   Amador Cunas, PT, DPT Donald Prose [place] 03/09/2021, 1:44 PM  [place]. [place], [place], [address] Phone: [phone]   Fax:  [phone]  Name: Cassandra [name] MRN: [id number] Date of Birth: 24-Jul-1946

## 2021-03-13 DIAGNOSIS — I2699 Other pulmonary embolism without acute cor pulmonale: Secondary | ICD-10-CM | POA: Diagnosis not present

## 2021-03-14 ENCOUNTER — Ambulatory Visit: Payer: Medicare HMO | Admitting: Physical Therapy

## 2021-03-14 ENCOUNTER — Other Ambulatory Visit: Payer: Self-pay

## 2021-03-14 ENCOUNTER — Encounter: Payer: Self-pay | Admitting: Physical Therapy

## 2021-03-14 DIAGNOSIS — M25562 Pain in left knee: Secondary | ICD-10-CM

## 2021-03-14 DIAGNOSIS — R600 Localized edema: Secondary | ICD-10-CM | POA: Diagnosis not present

## 2021-03-14 DIAGNOSIS — M25662 Stiffness of left knee, not elsewhere classified: Secondary | ICD-10-CM

## 2021-03-14 DIAGNOSIS — R262 Difficulty in walking, not elsewhere classified: Secondary | ICD-10-CM

## 2021-03-14 NOTE — Therapy (Signed)
[place]. [place], [place], [address] Phone: [phone]   Fax:  [phone]  Physical Therapy Treatment  Patient Details  Name: Cassandra [name] MRN: [id number] Date of Birth: 1946-02-29 Referring Provider (PT): Aluisio   Encounter Date: 03/14/2021   PT End of Session - 03/14/21 1657     Visit Number 19    Date for PT Re-Evaluation 03/16/21    PT Start Time 1600    PT Stop Time 1017    PT Time Calculation (min) 43 min    Activity Tolerance Patient tolerated treatment well    Behavior During Therapy [facility] for tasks assessed/performed             Past Medical History:  Diagnosis Date   Arthritis    Cancer ([place])    Breast cancer right '06- surgery, chemo, radiation- released after 5 yrs   Dyspnea    Hx of seasonal allergies    OTC med used   Hypertension    Hypothyroidism     Past Surgical History:  Procedure Laterality Date   BREAST SURGERY Right    modified partial mastectomy/ lymph nodes dissection   COLON SURGERY  2/17   INCISIONAL HERNIA REPAIR N/A 04/06/2016   Procedure: LAPAROSCOPIC ASSISTED INCISIONAL HERNIA REPAIR WITH MESH;  Surgeon: Leighton Ruff, MD;  [facility];  Service: General;  Laterality: N/A;   TONSILLECTOMY     TOTAL KNEE ARTHROPLASTY Left 12/19/2020   Procedure: TOTAL KNEE ARTHROPLASTY;  Surgeon: Gaynelle Arabian, MD;  [facility];  Service: Orthopedics;  Laterality: Left;  65mn    There were no vitals filed for this visit.   Subjective Assessment - 03/14/21 1602     Subjective Pt states mild 3/10 in R knee from walking too much this weekend. No pain on surgical knee.    Currently in Pain? No/denies                               [facility] - 03/14/21 0001       Knee/Hip Exercises: Stretches   Gastroc Stretch Both;1 rep;30 seconds      Knee/Hip Exercises: Aerobic   Recumbent Bike x 5 min full revolutions    Nustep  L5 x 6 min LE only      Knee/Hip Exercises: Machines for Strengthening   Cybex Knee Extension 10# BLE 2 sets 10, 5# SL 10 each    Cybex Knee Flexion 25# 2 sets 10 BIL    Cybex Leg Press calf raises 40# 2x10    Total Gym Leg Press 40# 2 sets 10      Knee/Hip Exercises: Standing   Heel Raises Both;1 set;15 reps    Forward Step Up Both;2 sets;5 reps;Hand Hold: 0;Step Height: 4";Step Height: 6"    Forward Step Up Limitations 4" step with RLE d/t instability/pain. 6" step with LLE    Walking with Sports Cord 30# x5 each direction    Other Standing Knee Exercises 6" alt step taps x10 B      Knee/Hip Exercises: Seated   Sit to Sand 2 sets;10 reps;without UE support   yellow ball chest press and OHP                     PT Short Term Goals - 01/03/21 1417       PT SHORT TERM GOAL #1   Title Pt  will be I with initial HEP    Time 2    Period Weeks    Status Achieved    Target Date 01/05/21               PT Long Term Goals - 02/23/21 1414       PT LONG TERM GOAL #1   Title Pt will demo gait without AD and with no major gait deviations    Baseline some deficiets as RT knee needs replaced too    Status Partially Met      PT LONG TERM GOAL #2   Title Pt will demo knee flexion to 120 deg    Baseline 110    Status Partially Met      PT LONG TERM GOAL #3   Title Pt will demo full L knee extension    Baseline 3 from full ext    Status Partially Met                   Plan - 03/14/21 1657     Clinical Impression Statement Pt demos good tolerance to progression of TE. Having increased pain in R knee so modified step ups on RLE to 4" stair. Cues for equal step length with resisted gait, no LOB. Cues for TKE with knee extension machine. Discussed continuance of therapy through current scheduled appts with goal of discharging with updated HEP at end of last session 7/7.    PT Treatment/Interventions ADLs/Self Care Home Management;Electrical Stimulation;Gait  training;Stair training;Functional mobility training;Therapeutic activities;Therapeutic exercise;Balance training;Manual techniques;Patient/family education;Passive range of motion;Vasopneumatic Device    PT Next Visit Plan pt to try gym next and look D/C by end of month    Consulted and Agree with Plan of Care Patient             Patient will benefit from skilled therapeutic intervention in order to improve the following deficits and impairments:  Abnormal gait, Pain, Decreased mobility, Decreased activity tolerance, Decreased range of motion, Decreased strength, Hypomobility, Difficulty walking, Increased edema  Visit Diagnosis: Difficulty in walking, not elsewhere classified  Stiffness of left knee, not elsewhere classified  Acute pain of left knee  Localized edema     Problem List Patient Active Problem List   Diagnosis Date Noted   OA (osteoarthritis) of knee 12/19/2020   Primary osteoarthritis of left knee 12/19/2020   Obstructive sleep apnea 06/19/2019   Malignant neoplasm of overlapping sites of right breast in female, estrogen receptor positive ([place]) 05/14/2019   Coagulopathy ([place]) 05/14/2019   Morbid obesity ([place]) 05/14/2019   Chronic thromboembolic disease ([place]) 07/62/2633   Pulmonary vascular disease ([place]) 04/08/2019   Hypothyroidism 04/07/2019   Essential hypertension 04/07/2019   Pulmonary embolism ([place]) 04/06/2019   Abnormal liver function 04/06/2019   Hypokalemia 04/06/2019   Incisional hernia 04/06/2016   Amador Cunas, PT, DPT Donald Prose [place] 03/14/2021, 4:59 PM  [place]. [place], [place], [address] Phone: [phone]   Fax:  [phone]  Name: PELAGIA IACOBUCCI MRN: [id number] Date of Birth: [birth year]

## 2021-03-16 ENCOUNTER — Other Ambulatory Visit: Payer: Self-pay

## 2021-03-16 ENCOUNTER — Encounter: Payer: Self-pay | Admitting: Physical Therapy

## 2021-03-16 ENCOUNTER — Ambulatory Visit: Payer: Medicare HMO | Admitting: Physical Therapy

## 2021-03-16 DIAGNOSIS — I10 Essential (primary) hypertension: Secondary | ICD-10-CM | POA: Diagnosis not present

## 2021-03-16 DIAGNOSIS — Z86711 Personal history of pulmonary embolism: Secondary | ICD-10-CM | POA: Diagnosis not present

## 2021-03-16 DIAGNOSIS — Z1389 Encounter for screening for other disorder: Secondary | ICD-10-CM | POA: Diagnosis not present

## 2021-03-16 DIAGNOSIS — R600 Localized edema: Secondary | ICD-10-CM | POA: Diagnosis not present

## 2021-03-16 DIAGNOSIS — E039 Hypothyroidism, unspecified: Secondary | ICD-10-CM | POA: Diagnosis not present

## 2021-03-16 DIAGNOSIS — R262 Difficulty in walking, not elsewhere classified: Secondary | ICD-10-CM

## 2021-03-16 DIAGNOSIS — R7303 Prediabetes: Secondary | ICD-10-CM | POA: Diagnosis not present

## 2021-03-16 DIAGNOSIS — R609 Edema, unspecified: Secondary | ICD-10-CM | POA: Diagnosis not present

## 2021-03-16 DIAGNOSIS — Z7901 Long term (current) use of anticoagulants: Secondary | ICD-10-CM | POA: Diagnosis not present

## 2021-03-16 DIAGNOSIS — M25662 Stiffness of left knee, not elsewhere classified: Secondary | ICD-10-CM | POA: Diagnosis not present

## 2021-03-16 DIAGNOSIS — M25562 Pain in left knee: Secondary | ICD-10-CM

## 2021-03-16 DIAGNOSIS — E785 Hyperlipidemia, unspecified: Secondary | ICD-10-CM | POA: Diagnosis not present

## 2021-03-16 DIAGNOSIS — Z0000 Encounter for general adult medical examination without abnormal findings: Secondary | ICD-10-CM | POA: Diagnosis not present

## 2021-03-16 NOTE — Therapy (Deleted)
[place]. [place], [place], [address] Phone: [phone]   Fax:  [phone]  Physical Therapy Treatment  Patient Details  Name: Cassandra [name] MRN: [id number] Date of Birth: [birth year] Referring Provider (PT): Aluisio   Encounter Date: 03/16/2021   PT End of Session - 03/16/21 1626     Visit Number 20    Number of Visits 12    Date for PT Re-Evaluation 03/16/21    Authorization Type 1/12 by 7/8    PT Start Time 0331    PT Stop Time 0414    PT Time Calculation (min) 43 min    Activity Tolerance Patient tolerated treatment well    Behavior During Therapy [facility] for tasks assessed/performed             Past Medical History:  Diagnosis Date   Arthritis    Cancer ([place])    Breast cancer right '06- surgery, chemo, radiation- released after 5 yrs   Dyspnea    Hx of seasonal allergies    OTC med used   Hypertension    Hypothyroidism     Past Surgical History:  Procedure Laterality Date   BREAST SURGERY Right    modified partial mastectomy/ lymph nodes dissection   COLON SURGERY  2/17   INCISIONAL HERNIA REPAIR N/A 04/06/2016   Procedure: LAPAROSCOPIC ASSISTED INCISIONAL HERNIA REPAIR WITH MESH;  Surgeon: Leighton Ruff, MD;  [facility];  Service: General;  Laterality: N/A;   TONSILLECTOMY     TOTAL KNEE ARTHROPLASTY Left 12/19/2020   Procedure: TOTAL KNEE ARTHROPLASTY;  Surgeon: Gaynelle Arabian, MD;  [facility];  Service: Orthopedics;  Laterality: Left;  51mn    There were no vitals filed for this visit.   Subjective Assessment - 03/16/21 1536     Subjective Pt reports she is doing well. She has planned to get her other knee done in January.    Currently in Pain? No/denies    Pain Score 0-No pain    Pain Location Knee    Pain Orientation Left                               [facility] Adult PT Treatment/Exercise - 03/16/21 0001       High Level Balance   High Level  Balance Activities Side stepping   over airex beams in parallel bars   High Level Balance Comments forward walking over airex beams, tandem walking on airex beams, with 3# SLS x30 sec B 2x on airex      Knee/Hip Exercises: Aerobic   Recumbent Bike x 5 min full revolutions    Nustep L6 x 6 min LE only      Knee/Hip Exercises: Standing   Knee Flexion 2 sets   3#, cues for keeping trunk up and not leaning     Knee/Hip Exercises: Seated   Long Arc Quad 2 sets;10 reps;Weights    Long Arc Quad Weight 3 lbs.    Sit to Sand 2 sets;10 reps;without UE support   with 4# chest press and OHP                     PT Short Term Goals - 01/03/21 1417       PT SHORT TERM GOAL #1   Title Pt will be I with initial HEP    Time 2    Period Weeks  Status Achieved    Target Date 01/05/21               PT Long Term Goals - 03/16/21 1625       PT LONG TERM GOAL #1   Title Pt will demo gait without AD and with no major gait deviations    Baseline some deficiets as RT knee needs replaced too    Time 8    Period Weeks    Status Partially Met      PT LONG TERM GOAL #2   Title Pt will demo knee flexion to 120 deg    Baseline 110    Time 8    Period Weeks    Status Partially Met      PT LONG TERM GOAL #3   Title Pt will demo full L knee extension    Baseline 3 from full ext    Time 8    Period Weeks    Status Partially Met      PT LONG TERM GOAL #4   Title Pt will demo TUG <15 sec with LRAD    Baseline 9.69 with SPC, 10.88 w/ no AD    Time 8    Period Weeks    Status Achieved      PT LONG TERM GOAL #5   Title Pt will demo circumferential edema of L knee </= 2 cm diff from R knee    Baseline R knee: 39.5 cm, L knee: 41.5 cm    Time 8    Period Weeks    Status Achieved                   Plan - 03/16/21 1616     Clinical Impression Statement Pt tolerated exercises well and reported feeling well following treatment. Continued to progress strength and  worked on balance. Pt needed cues during standing knee flexion. She had decreased balance on the left side.    PT Treatment/Interventions ADLs/Self Care Home Management;Electrical Stimulation;Gait training;Stair training;Functional mobility training;Therapeutic activities;Therapeutic exercise;Balance training;Manual techniques;Patient/family education;Passive range of motion;Vasopneumatic Device    PT Next Visit Plan pt to try gym next and look D/C by end of month    PT Home Exercise Plan knee flexion/extension stretching, supine ex's prescribed from hospital             Patient will benefit from skilled therapeutic intervention in order to improve the following deficits and impairments:  Abnormal gait, Pain, Decreased mobility, Decreased activity tolerance, Decreased range of motion, Decreased strength, Hypomobility, Difficulty walking, Increased edema  Visit Diagnosis: Difficulty in walking, not elsewhere classified  Stiffness of left knee, not elsewhere classified  Acute pain of left knee  Localized edema     Problem List Patient Active Problem List   Diagnosis Date Noted   OA (osteoarthritis) of knee 12/19/2020   Primary osteoarthritis of left knee 12/19/2020   Obstructive sleep apnea 06/19/2019   Malignant neoplasm of overlapping sites of right breast in female, estrogen receptor positive ([place]) 05/14/2019   Coagulopathy ([place]) 05/14/2019   Morbid obesity ([place]) 05/14/2019   Chronic thromboembolic disease ([place]) 42/70/6237   Pulmonary vascular disease ([place]) 04/08/2019   Hypothyroidism 04/07/2019   Essential hypertension 04/07/2019   Pulmonary embolism ([place]) 04/06/2019   Abnormal liver function 04/06/2019   Hypokalemia 04/06/2019   Incisional hernia 04/06/2016    PAYSEUR,ANGIE  03/16/2021, 4:58 PM  [place]. [place], [place], [address] Phone: [phone]  Fax:  [phone]  Name: Cassandra [name] MRN: [id number] Date of Birth: 1946-03-05

## 2021-03-16 NOTE — Therapy (Addendum)
[place]. [place], [place], [address] Phone: [phone]   Fax:  [phone]  Physical Therapy Treatment Progress Note Reporting Period 01/26/21 to 03/16/21  See note below for Objective Data and Assessment of Progress/Goals.     Patient Details  Name: Cassandra [name] MRN: [id number] Date of Birth: 27-Aug-1946 Referring Provider (PT): Aluisio   Encounter Date: 03/16/2021   PT End of Session - 03/16/21 1626     Visit Number 20    Number of Visits 12    Date for PT Re-Evaluation 03/16/21    Authorization Type 1/12 by 7/8    PT Start Time 0331    PT Stop Time 0414    PT Time Calculation (min) 43 min    Activity Tolerance Patient tolerated treatment well    Behavior During Therapy [facility] for tasks assessed/performed             Past Medical History:  Diagnosis Date   Arthritis    Cancer ([place])    Breast cancer right '06- surgery, chemo, radiation- released after 5 yrs   Dyspnea    Hx of seasonal allergies    OTC med used   Hypertension    Hypothyroidism     Past Surgical History:  Procedure Laterality Date   BREAST SURGERY Right    modified partial mastectomy/ lymph nodes dissection   COLON SURGERY  2/17   INCISIONAL HERNIA REPAIR N/A 04/06/2016   Procedure: LAPAROSCOPIC ASSISTED INCISIONAL HERNIA REPAIR WITH MESH;  Surgeon: Leighton Ruff, MD;  [facility];  Service: General;  Laterality: N/A;   TONSILLECTOMY     TOTAL KNEE ARTHROPLASTY Left 12/19/2020   Procedure: TOTAL KNEE ARTHROPLASTY;  Surgeon: Gaynelle Arabian, MD;  [facility]: [facility];  Service: Orthopedics;  Laterality: Left;  69mn    There were no vitals filed for this visit.   Subjective Assessment - 03/16/21 1536     Subjective Pt reports she is doing well. She has planned to get her other knee done in January.    Currently in Pain? No/denies    Pain Score 0-No pain    Pain Location Knee    Pain Orientation Left                                [facility] Adult PT Treatment/Exercise - 03/16/21 0001       High Level Balance   High Level Balance Activities Side stepping   over airex beams in parallel bars   High Level Balance Comments forward walking over airex beams, tandem walking on airex beams, with 3# SLS x30 sec B 2x on airex      Knee/Hip Exercises: Aerobic   Recumbent Bike x 5 min full revolutions    Nustep L6 x 6 min LE only      Knee/Hip Exercises: Standing   Knee Flexion 2 sets   3#, cues for keeping trunk up and not leaning     Knee/Hip Exercises: Seated   Long Arc Quad 2 sets;10 reps;Weights    Long Arc Quad Weight 3 lbs.    Sit to Sand 2 sets;10 reps;without UE support   with 4# chest press and OHP                     PT Short Term Goals - 01/03/21 1417       PT SHORT TERM GOAL #1  Title Pt will be I with initial HEP    Time 2    Period Weeks    Status Achieved    Target Date 01/05/21               PT Long Term Goals - 03/16/21 1625       PT LONG TERM GOAL #1   Title Pt will demo gait without AD and with no major gait deviations    Baseline some deficiets as RT knee needs replaced too    Time 8    Period Weeks    Status Partially Met      PT LONG TERM GOAL #2   Title Pt will demo knee flexion to 120 deg    Baseline 110    Time 8    Period Weeks    Status Partially Met      PT LONG TERM GOAL #3   Title Pt will demo full L knee extension    Baseline 3 from full ext    Time 8    Period Weeks    Status Partially Met      PT LONG TERM GOAL #4   Title Pt will demo TUG <15 sec with LRAD    Baseline 9.69 with SPC, 10.88 w/ no AD    Time 8    Period Weeks    Status Achieved      PT LONG TERM GOAL #5   Title Pt will demo circumferential edema of L knee </= 2 cm diff from R knee    Baseline R knee: 39.5 cm, L knee: 41.5 cm    Time 8    Period Weeks    Status Achieved                   Plan - 03/16/21 1616     Clinical  Impression Statement Pt tolerated exercises well and reported feeling well following treatment. Continued to progress strength and worked on balance. Pt needed cues during standing knee flexion. She had decreased balance on the left side.    PT Treatment/Interventions ADLs/Self Care Home Management;Electrical Stimulation;Gait training;Stair training;Functional mobility training;Therapeutic activities;Therapeutic exercise;Balance training;Manual techniques;Patient/family education;Passive range of motion;Vasopneumatic Device    PT Next Visit Plan pt to try gym next and look D/C by end of month    PT Home Exercise Plan knee flexion/extension stretching, supine ex's prescribed from hospital             Patient will benefit from skilled therapeutic intervention in order to improve the following deficits and impairments:  Abnormal gait, Pain, Decreased mobility, Decreased activity tolerance, Decreased range of motion, Decreased strength, Hypomobility, Difficulty walking, Increased edema  Visit Diagnosis: Difficulty in walking, not elsewhere classified  Stiffness of left knee, not elsewhere classified  Acute pain of left knee  Localized edema     Problem List Patient Active Problem List   Diagnosis Date Noted   OA (osteoarthritis) of knee 12/19/2020   Primary osteoarthritis of left knee 12/19/2020   Obstructive sleep apnea 06/19/2019   Malignant neoplasm of overlapping sites of right breast in female, estrogen receptor positive ([place]) 05/14/2019   Coagulopathy ([place]) 05/14/2019   Morbid obesity ([place]) 05/14/2019   Chronic thromboembolic disease ([place]) 59/74/1638   Pulmonary vascular disease ([place]) 04/08/2019   Hypothyroidism 04/07/2019   Essential hypertension 04/07/2019   Pulmonary embolism ([place]) 04/06/2019   Abnormal liver function 04/06/2019   Hypokalemia 04/06/2019   Incisional hernia 04/06/2016   Amador Cunas, PT, DPT  Dawayne Cirri, SPTA 03/16/2021, 4:27 PM  [place]. [place], [place], [address] Phone: [phone]   Fax:  [phone]  Name: Cassandra [name] MRN: [id number] Date of Birth: 1945/11/02

## 2021-03-21 ENCOUNTER — Other Ambulatory Visit: Payer: Self-pay

## 2021-03-21 ENCOUNTER — Encounter: Payer: Self-pay | Admitting: Physical Therapy

## 2021-03-21 ENCOUNTER — Ambulatory Visit: Payer: Medicare HMO | Admitting: Physical Therapy

## 2021-03-21 DIAGNOSIS — M25662 Stiffness of left knee, not elsewhere classified: Secondary | ICD-10-CM | POA: Diagnosis not present

## 2021-03-21 DIAGNOSIS — M25562 Pain in left knee: Secondary | ICD-10-CM

## 2021-03-21 DIAGNOSIS — R262 Difficulty in walking, not elsewhere classified: Secondary | ICD-10-CM

## 2021-03-21 DIAGNOSIS — R600 Localized edema: Secondary | ICD-10-CM | POA: Diagnosis not present

## 2021-03-21 NOTE — Therapy (Signed)
[place]. [place], [place], [address] Phone: [phone]   Fax:  [phone]  Physical Therapy Treatment  Patient Details  Name: Cassandra [name] MRN: [id number] Date of Birth: May 12, 1946 Referring Provider (PT): Aluisio   Encounter Date: 03/21/2021   PT End of Session - 03/21/21 1435     Visit Number 21    Date for PT Re-Evaluation 04/20/21    PT Start Time 1300    PT Stop Time 1342    PT Time Calculation (min) 42 min    Activity Tolerance Patient tolerated treatment well    Behavior During Therapy [facility] for tasks assessed/performed             Past Medical History:  Diagnosis Date   Arthritis    Cancer ([place])    Breast cancer right '06- surgery, chemo, radiation- released after 5 yrs   Dyspnea    Hx of seasonal allergies    OTC med used   Hypertension    Hypothyroidism     Past Surgical History:  Procedure Laterality Date   BREAST SURGERY Right    modified partial mastectomy/ lymph nodes dissection   COLON SURGERY  2/17   INCISIONAL HERNIA REPAIR N/A 04/06/2016   Procedure: LAPAROSCOPIC ASSISTED INCISIONAL HERNIA REPAIR WITH MESH;  Surgeon: Leighton Ruff, MD;  [facility];  Service: General;  Laterality: N/A;   TONSILLECTOMY     TOTAL KNEE ARTHROPLASTY Left 12/19/2020   Procedure: TOTAL KNEE ARTHROPLASTY;  Surgeon: Gaynelle Arabian, MD;  [facility];  Service: Orthopedics;  Laterality: Left;  67mn    There were no vitals filed for this visit.   Subjective Assessment - 03/21/21 1304     Subjective Pt reports doing well overall. States she has increased her activity recently and this has increased her pain in B knees.    Currently in Pain? Yes    Pain Score 4     Pain Location Knee    Pain Orientation Left                               [facility] Adult PT Treatment/Exercise - 03/21/21 0001       Knee/Hip Exercises: Stretches   Gastroc Stretch Both;1 rep;30  seconds      Knee/Hip Exercises: Aerobic   Recumbent Bike x 5 min full revolutions    Nustep L6 x 6 min LE only      Knee/Hip Exercises: Machines for Strengthening   Cybex Knee Extension 10# BLE 2 sets 10, 5# SL 10 each    Cybex Knee Flexion 25# 2 sets 10 BIL; 15# 1x10 single leg    Cybex Leg Press calf raises 40# 2x15    Total Gym Leg Press 40# 2 sets 10      Knee/Hip Exercises: Standing   Heel Raises Both;1 set;15 reps    Walking with Sports Cord 30# x5 each direction    Other Standing Knee Exercises 6" alt step taps x10 B      Knee/Hip Exercises: Seated   Sit to Sand 2 sets;10 reps;without UE support   yellow ball chest press and OHP                     PT Short Term Goals - 01/03/21 1417       PT SHORT TERM GOAL #1   Title Pt will be I with initial HEP  Time 2    Period Weeks    Status Achieved    Target Date 01/05/21               PT Long Term Goals - 03/16/21 1625       PT LONG TERM GOAL #1   Title Pt will demo gait without AD and with no major gait deviations    Baseline some deficiets as RT knee needs replaced too    Time 8    Period Weeks    Status Partially Met      PT LONG TERM GOAL #2   Title Pt will demo knee flexion to 120 deg    Baseline 110    Time 8    Period Weeks    Status Partially Met      PT LONG TERM GOAL #3   Title Pt will demo full L knee extension    Baseline 3 from full ext    Time 8    Period Weeks    Status Partially Met      PT LONG TERM GOAL #4   Title Pt will demo TUG <15 sec with LRAD    Baseline 9.69 with SPC, 10.88 w/ no AD    Time 8    Period Weeks    Status Achieved      PT LONG TERM GOAL #5   Title Pt will demo circumferential edema of L knee </= 2 cm diff from R knee    Baseline R knee: 39.5 cm, L knee: 41.5 cm    Time 8    Period Weeks    Status Achieved                   Plan - 03/21/21 1435     Clinical Impression Statement Pt still demos trendelenburg with fatigue,  decreased stance time LLE, and compensation with knee flexion on recumbent bike. Progressing strength and working towards more functional activities. Did not include step ups this rx d/t reports of increased B knee pain following increase in activity this weekend. Continue to progress functional strengthening to tolreance with goal of d/c after last scheduled rx 03/30/21.    PT Treatment/Interventions ADLs/Self Care Home Management;Electrical Stimulation;Gait training;Stair training;Functional mobility training;Therapeutic activities;Therapeutic exercise;Balance training;Manual techniques;Patient/family education;Passive range of motion;Vasopneumatic Device    PT Next Visit Plan pt to try gym next and look D/C by 03/31/21    Consulted and Agree with Plan of Care Patient             Patient will benefit from skilled therapeutic intervention in order to improve the following deficits and impairments:  Abnormal gait, Pain, Decreased mobility, Decreased activity tolerance, Decreased range of motion, Decreased strength, Hypomobility, Difficulty walking, Increased edema  Visit Diagnosis: Difficulty in walking, not elsewhere classified  Stiffness of left knee, not elsewhere classified  Acute pain of left knee  Localized edema     Problem List Patient Active Problem List   Diagnosis Date Noted   OA (osteoarthritis) of knee 12/19/2020   Primary osteoarthritis of left knee 12/19/2020   Obstructive sleep apnea 06/19/2019   Malignant neoplasm of overlapping sites of right breast in female, estrogen receptor positive ([place]) 05/14/2019   Coagulopathy ([place]) 05/14/2019   Morbid obesity ([place]) 05/14/2019   Chronic thromboembolic disease ([place]) 21/07/5519   Pulmonary vascular disease ([place]) 04/08/2019   Hypothyroidism 04/07/2019   Essential hypertension 04/07/2019   Pulmonary embolism ([place]) 04/06/2019   Abnormal liver function 04/06/2019   Hypokalemia  04/06/2019   Incisional hernia 04/06/2016    Amador Cunas, PT, DPT Donald Prose Sruthi Maurer 03/21/2021, 2:37 PM  [place]. [place], [place], [address] Phone: [phone]   Fax:  [phone]  Name: MAEVE DEBORD MRN: [id number] Date of Birth: 01/21/1946

## 2021-03-23 ENCOUNTER — Other Ambulatory Visit: Payer: Self-pay

## 2021-03-23 ENCOUNTER — Ambulatory Visit: Payer: Medicare HMO | Admitting: Physical Therapy

## 2021-03-23 ENCOUNTER — Encounter: Payer: Self-pay | Admitting: Physical Therapy

## 2021-03-23 DIAGNOSIS — R600 Localized edema: Secondary | ICD-10-CM

## 2021-03-23 DIAGNOSIS — R262 Difficulty in walking, not elsewhere classified: Secondary | ICD-10-CM

## 2021-03-23 DIAGNOSIS — M170 Bilateral primary osteoarthritis of knee: Secondary | ICD-10-CM | POA: Diagnosis not present

## 2021-03-23 DIAGNOSIS — M25562 Pain in left knee: Secondary | ICD-10-CM

## 2021-03-23 DIAGNOSIS — E039 Hypothyroidism, unspecified: Secondary | ICD-10-CM | POA: Diagnosis not present

## 2021-03-23 DIAGNOSIS — M25662 Stiffness of left knee, not elsewhere classified: Secondary | ICD-10-CM

## 2021-03-23 DIAGNOSIS — E785 Hyperlipidemia, unspecified: Secondary | ICD-10-CM | POA: Diagnosis not present

## 2021-03-23 DIAGNOSIS — M8580 Other specified disorders of bone density and structure, unspecified site: Secondary | ICD-10-CM | POA: Diagnosis not present

## 2021-03-23 DIAGNOSIS — G4700 Insomnia, unspecified: Secondary | ICD-10-CM | POA: Diagnosis not present

## 2021-03-23 DIAGNOSIS — I10 Essential (primary) hypertension: Secondary | ICD-10-CM | POA: Diagnosis not present

## 2021-03-23 NOTE — Therapy (Signed)
[place]. [place], [place], [address] Phone: [phone]   Fax:  [phone]  Physical Therapy Treatment  Patient Details  Name: Cassandra [name] MRN: [id number] Date of Birth: Jan 06, 1946 Referring Provider (PT): Aluisio   Encounter Date: 03/23/2021   PT End of Session - 03/23/21 1627     Visit Number 22    Date for PT Re-Evaluation 04/20/21    PT Start Time 1523    PT Stop Time 1600    PT Time Calculation (min) 37 min    Activity Tolerance Patient tolerated treatment well    Behavior During Therapy [facility] for tasks assessed/performed             Past Medical History:  Diagnosis Date   Arthritis    Cancer ([place])    Breast cancer right '06- surgery, chemo, radiation- released after 5 yrs   Dyspnea    Hx of seasonal allergies    OTC med used   Hypertension    Hypothyroidism     Past Surgical History:  Procedure Laterality Date   BREAST SURGERY Right    modified partial mastectomy/ lymph nodes dissection   COLON SURGERY  2/17   INCISIONAL HERNIA REPAIR N/A 04/06/2016   Procedure: LAPAROSCOPIC ASSISTED INCISIONAL HERNIA REPAIR WITH MESH;  Surgeon: Leighton Ruff, MD;  [facility];  Service: General;  Laterality: N/A;   TONSILLECTOMY     TOTAL KNEE ARTHROPLASTY Left 12/19/2020   Procedure: TOTAL KNEE ARTHROPLASTY;  Surgeon: Gaynelle Arabian, MD;  [facility];  Service: Orthopedics;  Laterality: Left;  45mn    There were no vitals filed for this visit.   Subjective Assessment - 03/23/21 1524     Subjective Pt doing well, no new changes this rx    Currently in Pain? No/denies    Pain Score 0-No pain    Pain Location Knee                               [facility] Adult PT Treatment/Exercise - 03/23/21 0001       Knee/Hip Exercises: Aerobic   Recumbent Bike x 5 min full revolutions    Nustep L6 x 6 min LE only      Knee/Hip Exercises: Machines for Strengthening   Cybex  Knee Extension 15# BLE 2x10; 5# x10 single leg    Cybex Knee Flexion 25# 2 sets 10 BIL; 15# 1x10 single leg    Cybex Leg Press calf raises 40# 2x15    Total Gym Leg Press 40# 2 sets 10      Knee/Hip Exercises: Standing   Heel Raises Both;1 set;15 reps    Walking with Sports Cord 40# x5 each direction                      PT Short Term Goals - 01/03/21 1417       PT SHORT TERM GOAL #1   Title Pt will be I with initial HEP    Time 2    Period Weeks    Status Achieved    Target Date 01/05/21               PT Long Term Goals - 03/16/21 1625       PT LONG TERM GOAL #1   Title Pt will demo gait without AD and with no major gait deviations    Baseline some deficiets  as RT knee needs replaced too    Time 8    Period Weeks    Status Partially Met      PT LONG TERM GOAL #2   Title Pt will demo knee flexion to 120 deg    Baseline 110    Time 8    Period Weeks    Status Partially Met      PT LONG TERM GOAL #3   Title Pt will demo full L knee extension    Baseline 3 from full ext    Time 8    Period Weeks    Status Partially Met      PT LONG TERM GOAL #4   Title Pt will demo TUG <15 sec with LRAD    Baseline 9.69 with SPC, 10.88 w/ no AD    Time 8    Period Weeks    Status Achieved      PT LONG TERM GOAL #5   Title Pt will demo circumferential edema of L knee </= 2 cm diff from R knee    Baseline R knee: 39.5 cm, L knee: 41.5 cm    Time 8    Period Weeks    Status Achieved                   Plan - 03/23/21 1628     Clinical Impression Statement Pt ~7 min late so abbreviated session this rx. Demos good tolerance to progression of weights with no c/o increased pain with machine interventions. Does report mild increased R knee pain with standing ex's. Cues for full TKE with knee extension machine. Continue to progress functional strengthening to tolerance; d/c at end of next week.    PT Treatment/Interventions ADLs/Self Care Home  Management;Electrical Stimulation;Gait training;Stair training;Functional mobility training;Therapeutic activities;Therapeutic exercise;Balance training;Manual techniques;Patient/family education;Passive range of motion;Vasopneumatic Device    PT Next Visit Plan pt to try gym next and look D/C by 03/31/21    Consulted and Agree with Plan of Care Patient             Patient will benefit from skilled therapeutic intervention in order to improve the following deficits and impairments:  Abnormal gait, Pain, Decreased mobility, Decreased activity tolerance, Decreased range of motion, Decreased strength, Hypomobility, Difficulty walking, Increased edema  Visit Diagnosis: Difficulty in walking, not elsewhere classified  Stiffness of left knee, not elsewhere classified  Acute pain of left knee  Localized edema     Problem List Patient Active Problem List   Diagnosis Date Noted   OA (osteoarthritis) of knee 12/19/2020   Primary osteoarthritis of left knee 12/19/2020   Obstructive sleep apnea 06/19/2019   Malignant neoplasm of overlapping sites of right breast in female, estrogen receptor positive ([place]) 05/14/2019   Coagulopathy ([place]) 05/14/2019   Morbid obesity ([place]) 05/14/2019   Chronic thromboembolic disease ([place]) 30/05/2329   Pulmonary vascular disease ([place]) 04/08/2019   Hypothyroidism 04/07/2019   Essential hypertension 04/07/2019   Pulmonary embolism ([place]) 04/06/2019   Abnormal liver function 04/06/2019   Hypokalemia 04/06/2019   Incisional hernia 04/06/2016   Amador Cunas, PT, DPT Donald Prose Medha Pippen 03/23/2021, 4:30 PM  [place]. [place], [place], [address] Phone: [phone]   Fax:  [phone]  Name: Cassandra [name] MRN: [id number] Date of Birth: Oct 21, 1945

## 2021-03-28 ENCOUNTER — Ambulatory Visit: Payer: Medicare HMO | Admitting: Physical Therapy

## 2021-03-28 ENCOUNTER — Encounter: Payer: Self-pay | Admitting: Physical Therapy

## 2021-03-28 ENCOUNTER — Ambulatory Visit: Payer: Medicare HMO | Attending: Orthopedic Surgery | Admitting: Physical Therapy

## 2021-03-28 ENCOUNTER — Other Ambulatory Visit: Payer: Self-pay

## 2021-03-28 DIAGNOSIS — R600 Localized edema: Secondary | ICD-10-CM

## 2021-03-28 DIAGNOSIS — M25662 Stiffness of left knee, not elsewhere classified: Secondary | ICD-10-CM

## 2021-03-28 DIAGNOSIS — R262 Difficulty in walking, not elsewhere classified: Secondary | ICD-10-CM

## 2021-03-28 DIAGNOSIS — M25562 Pain in left knee: Secondary | ICD-10-CM

## 2021-03-28 NOTE — Therapy (Signed)
[place]. [place], [place], [address] Phone: [phone]   Fax:  [phone]  Physical Therapy Treatment  Patient Details  Name: NIHARIKA SAVINO MRN: [id number] Date of Birth: 1946/06/17 Referring Provider (PT): Aluisio   Encounter Date: 03/28/2021   PT End of Session - 03/28/21 1427     Visit Number 23    Date for PT Re-Evaluation 04/20/21    PT Start Time 5947    PT Stop Time 0761    PT Time Calculation (min) 31 min    Activity Tolerance Patient tolerated treatment well    Behavior During Therapy [facility] for tasks assessed/performed             Past Medical History:  Diagnosis Date   Arthritis    Cancer ([place])    Breast cancer right '06- surgery, chemo, radiation- released after 5 yrs   Dyspnea    Hx of seasonal allergies    OTC med used   Hypertension    Hypothyroidism     Past Surgical History:  Procedure Laterality Date   BREAST SURGERY Right    modified partial mastectomy/ lymph nodes dissection   COLON SURGERY  2/17   INCISIONAL HERNIA REPAIR N/A 04/06/2016   Procedure: LAPAROSCOPIC ASSISTED INCISIONAL HERNIA REPAIR WITH MESH;  Surgeon: Leighton Ruff, MD;  [facility];  Service: General;  Laterality: N/A;   TONSILLECTOMY     TOTAL KNEE ARTHROPLASTY Left 12/19/2020   Procedure: TOTAL KNEE ARTHROPLASTY;  Surgeon: Gaynelle Arabian, MD;  [facility];  Service: Orthopedics;  Laterality: Left;  71mn    There were no vitals filed for this visit.   Subjective Assessment - 03/28/21 1401     Subjective Pt states mild increased stiffness this rx esp after standing a lot this weekend.    Currently in Pain? Yes    Pain Score 1     Pain Location Knee    Pain Orientation Left                               [facility] Adult PT Treatment/Exercise - 03/28/21 0001       Knee/Hip Exercises: Stretches   Gastroc Stretch Both;1 rep;30 seconds      Knee/Hip Exercises: Aerobic    Recumbent Bike x 5 min full revolutions    Nustep L6 x 6 min LE only      Knee/Hip Exercises: Machines for Strengthening   Cybex Knee Extension 15# BLE 2x10; 5# x10 single leg    Cybex Knee Flexion 25# 2 sets 10 BIL; 15# 1x10 single leg    Cybex Leg Press calf raises 40# 2x15    Total Gym Leg Press 40# 2 sets 10      Knee/Hip Exercises: Standing   Heel Raises Both;1 set;15 reps    Other Standing Knee Exercises 8" alt step taps x10 B                      PT Short Term Goals - 01/03/21 1417       PT SHORT TERM GOAL #1   Title Pt will be I with initial HEP    Time 2    Period Weeks    Status Achieved    Target Date 01/05/21               PT Long Term Goals - 03/16/21 1625  PT LONG TERM GOAL #1   Title Pt will demo gait without AD and with no major gait deviations    Baseline some deficiets as RT knee needs replaced too    Time 8    Period Weeks    Status Partially Met      PT LONG TERM GOAL #2   Title Pt will demo knee flexion to 120 deg    Baseline 110    Time 8    Period Weeks    Status Partially Met      PT LONG TERM GOAL #3   Title Pt will demo full L knee extension    Baseline 3 from full ext    Time 8    Period Weeks    Status Partially Met      PT LONG TERM GOAL #4   Title Pt will demo TUG <15 sec with LRAD    Baseline 9.69 with SPC, 10.88 w/ no AD    Time 8    Period Weeks    Status Achieved      PT LONG TERM GOAL #5   Title Pt will demo circumferential edema of L knee </= 2 cm diff from R knee    Baseline R knee: 39.5 cm, L knee: 41.5 cm    Time 8    Period Weeks    Status Achieved                   Plan - 03/28/21 1513     Clinical Impression Statement Pt ~13 min late so abbreviated session this rx. Decreased stability on RLE this session d/t increase in R knee pain. Pt plans to have R TKA in ~6 mos. No c/o increase in pain to L knee with TE. Cues for full TKE with knee extension machine. Plan is to d/c with  updated HEP next rx.    PT Treatment/Interventions ADLs/Self Care Home Management;Electrical Stimulation;Gait training;Stair training;Functional mobility training;Therapeutic activities;Therapeutic exercise;Balance training;Manual techniques;Patient/family education;Passive range of motion;Vasopneumatic Device    PT Next Visit Plan d/c with updated HEP    Consulted and Agree with Plan of Care Patient             Patient will benefit from skilled therapeutic intervention in order to improve the following deficits and impairments:  Abnormal gait, Pain, Decreased mobility, Decreased activity tolerance, Decreased range of motion, Decreased strength, Hypomobility, Difficulty walking, Increased edema  Visit Diagnosis: Difficulty in walking, not elsewhere classified  Stiffness of left knee, not elsewhere classified  Acute pain of left knee  Localized edema     Problem List Patient Active Problem List   Diagnosis Date Noted   OA (osteoarthritis) of knee 12/19/2020   Primary osteoarthritis of left knee 12/19/2020   Obstructive sleep apnea 06/19/2019   Malignant neoplasm of overlapping sites of right breast in female, estrogen receptor positive ([place]) 05/14/2019   Coagulopathy ([place]) 05/14/2019   Morbid obesity ([place]) 05/14/2019   Chronic thromboembolic disease ([place]) 16/38/4536   Pulmonary vascular disease ([place]) 04/08/2019   Hypothyroidism 04/07/2019   Essential hypertension 04/07/2019   Pulmonary embolism ([place]) 04/06/2019   Abnormal liver function 04/06/2019   Hypokalemia 04/06/2019   Incisional hernia 04/06/2016   Amador Cunas, PT, DPT Donald Prose Leelynn Whetsel 03/28/2021, 3:15 PM  [place]. [place], [place], [address] Phone: [phone]   Fax:  [phone]  Name: RUBBY BARBARY MRN: [id number] Date of Birth: 01-23-1946

## 2021-03-30 ENCOUNTER — Encounter: Payer: Self-pay | Admitting: Physical Therapy

## 2021-03-30 ENCOUNTER — Other Ambulatory Visit: Payer: Self-pay

## 2021-03-30 ENCOUNTER — Ambulatory Visit: Payer: Medicare HMO | Admitting: Physical Therapy

## 2021-03-30 DIAGNOSIS — R262 Difficulty in walking, not elsewhere classified: Secondary | ICD-10-CM

## 2021-03-30 DIAGNOSIS — M25562 Pain in left knee: Secondary | ICD-10-CM | POA: Diagnosis not present

## 2021-03-30 DIAGNOSIS — M25662 Stiffness of left knee, not elsewhere classified: Secondary | ICD-10-CM

## 2021-03-30 DIAGNOSIS — R600 Localized edema: Secondary | ICD-10-CM

## 2021-03-30 NOTE — Therapy (Addendum)
[place]. [place], [place], [address] Phone: [phone]   Fax:  [phone]  Physical Therapy Treatment  Patient Details  Name: Cassandra [name] MRN: [id number] Date of Birth: 1946/08/24 Referring Provider (PT): Aluisio   Encounter Date: 03/30/2021   PT End of Session - 03/30/21 1444     Visit Number 24    Number of Visits 12    Date for PT Re-Evaluation 04/20/21    Authorization Type 1/12 by 7/8    PT Start Time 1400    PT Stop Time 1440    PT Time Calculation (min) 40 min    Activity Tolerance Patient tolerated treatment well    Behavior During Therapy [facility] for tasks assessed/performed             Past Medical History:  Diagnosis Date   Arthritis    Cancer ([place])    Breast cancer right '06- surgery, chemo, radiation- released after 5 yrs   Dyspnea    Hx of seasonal allergies    OTC med used   Hypertension    Hypothyroidism     Past Surgical History:  Procedure Laterality Date   BREAST SURGERY Right    modified partial mastectomy/ lymph nodes dissection   COLON SURGERY  2/17   INCISIONAL HERNIA REPAIR N/A 04/06/2016   Procedure: LAPAROSCOPIC ASSISTED INCISIONAL HERNIA REPAIR WITH MESH;  Surgeon: Leighton Ruff, MD;  Location: [facility];  Service: General;  Laterality: N/A;   TONSILLECTOMY     TOTAL KNEE ARTHROPLASTY Left 12/19/2020   Procedure: TOTAL KNEE ARTHROPLASTY;  Surgeon: Gaynelle Arabian, MD;  Location: [facility];  Service: Orthopedics;  Laterality: Left;  53mn    There were no vitals filed for this visit.   Subjective Assessment - 03/30/21 1402     Subjective Pt reports she is doing well, no pain but some stiffness.    Currently in Pain? No/denies                [facility] - [facility]       AROM   Left Knee Extension 2    Left Knee Flexion 110                           [facility] Adult PT Treatment/Exercise - 03/30/21 0001       Knee/Hip Exercises:  Aerobic   Recumbent Bike x5 min full revolutions    Nustep L5 x 6 min LE only      Knee/Hip Exercises: Machines for Strengthening   Cybex Knee Extension 15# BLE 2x15; 5# x12 single leg    Cybex Knee Flexion 25# 2 sets 15 BIL; 15# 1x12 single leg L    Total Gym Leg Press 40# 2 sets 12      Knee/Hip Exercises: Standing   Heel Raises Both;1 set;20 reps    Forward Step Up 2 sets;Both      Knee/Hip Exercises: Seated   Sit to Sand 2 sets;without UE support;15 reps   yellow ball chest press and OHP                     PT Short Term Goals - 01/03/21 1417       PT SHORT TERM GOAL #1   Title Pt will be I with initial HEP    Time 2    Period Weeks    Status Achieved    Target Date  01/05/21               PT Long Term Goals - 03/30/21 1440       PT LONG TERM GOAL #1   Title Pt will demo gait without AD and with no major gait deviations    Time 8    Period Weeks    Status Achieved      PT LONG TERM GOAL #2   Title Pt will demo knee flexion to 120 deg    Baseline 110    Time 8    Period Weeks    Status Partially Met      PT LONG TERM GOAL #3   Title Pt will demo full L knee extension    Baseline 2 from full    Time 8    Period Weeks    Status Partially Met      PT LONG TERM GOAL #4   Title Pt will demo TUG <15 sec with LRAD    Baseline 9.69 with SPC, 10.88 w/ no AD    Time 8    Period Weeks    Status Achieved      PT LONG TERM GOAL #5   Title Pt will demo circumferential edema of L knee </= 2 cm diff from R knee    Baseline R knee: 39.5 cm, L knee: 41.5 cm    Time 8    Period Weeks    Status Achieved                   Plan - 03/30/21 1441     Clinical Impression Statement Pt tolerated all exercises well. She continues to have some knee stiffness in the L knee but no increases in pain. Pt partially met ROM goals and increased her extension to 2 degrees. She achieved all other goals.  Pt plans to be discharged today continuing with HEP.     PT Treatment/Interventions ADLs/Self Care Home Management;Electrical Stimulation;Gait training;Stair training;Functional mobility training;Therapeutic activities;Therapeutic exercise;Balance training;Manual techniques;Patient/family education;Passive range of motion;Vasopneumatic Device    PT Next Visit Plan discharge today             Patient will benefit from skilled therapeutic intervention in order to improve the following deficits and impairments:  Abnormal gait, Pain, Decreased mobility, Decreased activity tolerance, Decreased range of motion, Decreased strength, Hypomobility, Difficulty walking, Increased edema  Visit Diagnosis: Difficulty in walking, not elsewhere classified  Stiffness of left knee, not elsewhere classified  Acute pain of left knee  Localized edema     Problem List Patient Active Problem List   Diagnosis Date Noted   OA (osteoarthritis) of knee 12/19/2020   Primary osteoarthritis of left knee 12/19/2020   Obstructive sleep apnea 06/19/2019   Malignant neoplasm of overlapping sites of right breast in female, estrogen receptor positive ([place]) 05/14/2019   Coagulopathy ([place]) 05/14/2019   Morbid obesity ([place]) 05/14/2019   Chronic thromboembolic disease ([place]) 60/63/0160   Pulmonary vascular disease ([place]) 04/08/2019   Hypothyroidism 04/07/2019   Essential hypertension 04/07/2019   Pulmonary embolism ([place]) 04/06/2019   Abnormal liver function 04/06/2019   Hypokalemia 04/06/2019   Incisional hernia 04/06/2016   PHYSICAL THERAPY DISCHARGE SUMMARY   Patient agrees to discharge. Patient goals were partially met. Patient is being discharged due to being pleased with the current functional level. Amador Cunas, PT, DPT Dawayne Cirri, SPTA 03/30/2021, 2:46 PM  [place]. [place], [place], [address] Phone: [phone]  Fax:  [phone]  Name: Cassandra [name] MRN: [id number] Date of  Birth: Jun 02, 1946

## 2021-04-12 DIAGNOSIS — I2699 Other pulmonary embolism without acute cor pulmonale: Secondary | ICD-10-CM | POA: Diagnosis not present

## 2021-05-13 DIAGNOSIS — I2699 Other pulmonary embolism without acute cor pulmonale: Secondary | ICD-10-CM | POA: Diagnosis not present

## 2021-06-07 DIAGNOSIS — Z1231 Encounter for screening mammogram for malignant neoplasm of breast: Secondary | ICD-10-CM | POA: Diagnosis not present

## 2021-06-08 DIAGNOSIS — G4700 Insomnia, unspecified: Secondary | ICD-10-CM | POA: Diagnosis not present

## 2021-06-08 DIAGNOSIS — M8580 Other specified disorders of bone density and structure, unspecified site: Secondary | ICD-10-CM | POA: Diagnosis not present

## 2021-06-08 DIAGNOSIS — E039 Hypothyroidism, unspecified: Secondary | ICD-10-CM | POA: Diagnosis not present

## 2021-06-08 DIAGNOSIS — E785 Hyperlipidemia, unspecified: Secondary | ICD-10-CM | POA: Diagnosis not present

## 2021-06-08 DIAGNOSIS — M170 Bilateral primary osteoarthritis of knee: Secondary | ICD-10-CM | POA: Diagnosis not present

## 2021-06-08 DIAGNOSIS — I10 Essential (primary) hypertension: Secondary | ICD-10-CM | POA: Diagnosis not present

## 2021-06-13 DIAGNOSIS — I2699 Other pulmonary embolism without acute cor pulmonale: Secondary | ICD-10-CM | POA: Diagnosis not present

## 2021-07-13 DIAGNOSIS — I2699 Other pulmonary embolism without acute cor pulmonale: Secondary | ICD-10-CM | POA: Diagnosis not present

## 2021-07-21 DIAGNOSIS — E785 Hyperlipidemia, unspecified: Secondary | ICD-10-CM | POA: Diagnosis not present

## 2021-07-21 DIAGNOSIS — G4700 Insomnia, unspecified: Secondary | ICD-10-CM | POA: Diagnosis not present

## 2021-07-21 DIAGNOSIS — M170 Bilateral primary osteoarthritis of knee: Secondary | ICD-10-CM | POA: Diagnosis not present

## 2021-07-21 DIAGNOSIS — E039 Hypothyroidism, unspecified: Secondary | ICD-10-CM | POA: Diagnosis not present

## 2021-07-21 DIAGNOSIS — I10 Essential (primary) hypertension: Secondary | ICD-10-CM | POA: Diagnosis not present

## 2021-07-21 DIAGNOSIS — M8580 Other specified disorders of bone density and structure, unspecified site: Secondary | ICD-10-CM | POA: Diagnosis not present

## 2021-08-03 DIAGNOSIS — U071 COVID-19: Secondary | ICD-10-CM | POA: Diagnosis not present

## 2021-09-07 DIAGNOSIS — G4733 Obstructive sleep apnea (adult) (pediatric): Secondary | ICD-10-CM | POA: Diagnosis not present

## 2021-09-07 DIAGNOSIS — I10 Essential (primary) hypertension: Secondary | ICD-10-CM | POA: Diagnosis not present

## 2021-09-07 DIAGNOSIS — Z86711 Personal history of pulmonary embolism: Secondary | ICD-10-CM | POA: Diagnosis not present

## 2021-09-07 DIAGNOSIS — E785 Hyperlipidemia, unspecified: Secondary | ICD-10-CM | POA: Diagnosis not present

## 2021-09-07 DIAGNOSIS — Z7901 Long term (current) use of anticoagulants: Secondary | ICD-10-CM | POA: Diagnosis not present

## 2021-09-07 DIAGNOSIS — R7303 Prediabetes: Secondary | ICD-10-CM | POA: Diagnosis not present

## 2021-09-07 DIAGNOSIS — G4734 Idiopathic sleep related nonobstructive alveolar hypoventilation: Secondary | ICD-10-CM | POA: Diagnosis not present

## 2021-09-07 DIAGNOSIS — E039 Hypothyroidism, unspecified: Secondary | ICD-10-CM | POA: Diagnosis not present

## 2021-10-16 NOTE — Progress Notes (Signed)
Surgery orders requested via Epic inbox. °

## 2021-10-24 NOTE — Progress Notes (Signed)
Covid test on 11/02/21.  At __________ Come thru main entrance at [facility],  Have a seat in the lobby on the right as you come thru the door.  Call [phone] and givem them your name and let them know you are here for covid tesitng.                 Your procedure is scheduled on:                       11/06/21.   Report to [facility] Main  Entrance   Report to admitting at   0945am      Call this number if you have problems the morning of surgery [phone]    REMEMBER: NO  SOLID FOOD CANDY OR GUM AFTER MIDNIGHT. CLEAR LIQUIDS UNTIL    0940am      . NOTHING BY MOUTH EXCEPT CLEAR LIQUIDS UNTIL   0940am  . PLEASE FINISH ENSURE DRINK PER SURGEON ORDER  WHICH NEEDS TO BE COMPLETED AT   0940am    .      CLEAR LIQUID DIET   Foods Allowed                                                                    Coffee and tea, regular and decaf                            Fruit ices (not with fruit pulp)                                      Iced Popsicles                                    Carbonated beverages, regular and diet                                    Cranberry, grape and apple juices Sports drinks like Gatorade Lightly seasoned clear broth or consume(fat free) Sugar, honey syrup ___________________________________________________________________      BRUSH YOUR TEETH MORNING OF SURGERY AND RINSE YOUR MOUTH OUT, NO CHEWING GUM CANDY OR MINTS.     Take these medicines the morning of surgery with A SIP OF WATER:  synthroid , claritin, nifedipine   DO NOT TAKE ANY DIABETIC MEDICATIONS DAY OF YOUR SURGERY                               You may not have any metal on your body including hair pins and              piercings  Do not wear jewelry, make-up, lotions, powders or perfumes, deodorant             Do not wear nail polish on your fingernails.  Do not shave  48 hours prior to surgery.  Men may shave face and neck.   Do not bring valuables to the  hospital. [place].  Contacts, dentures or bridgework may not be worn into surgery.  Leave suitcase in the car. After surgery it may be brought to your room.     Patients discharged the day of surgery will not be allowed to drive home. IF YOU ARE HAVING SURGERY AND GOING HOME THE SAME DAY, YOU MUST HAVE AN ADULT TO DRIVE YOU HOME AND BE WITH YOU FOR 24 HOURS. YOU MAY GO HOME BY TAXI OR UBER OR ORTHERWISE, BUT AN ADULT MUST ACCOMPANY YOU HOME AND STAY WITH YOU FOR 24 HOURS.  Name and phone number of your driver:  Special Instructions: N/A              Please read over the following fact sheets you were given: _____________________________________________________________________  [facility] - Preparing for Surgery Before surgery, you can play an important role.  Because skin is not sterile, your skin needs to be as free of germs as possible.  You can reduce the number of germs on your skin by washing with CHG (chlorahexidine gluconate) soap before surgery.  CHG is an antiseptic cleaner which kills germs and bonds with the skin to continue killing germs even after washing. Please DO NOT use if you have an allergy to CHG or antibacterial soaps.  If your skin becomes reddened/irritated stop using the CHG and inform your nurse when you arrive at Short Stay. Do not shave (including legs and underarms) for at least 48 hours prior to the first CHG shower.  You may shave your face/neck. Please follow these instructions carefully:  1.  Shower with CHG Soap the night before surgery and the  morning of Surgery.  2.  If you choose to wash your hair, wash your hair first as usual with your  normal  shampoo.  3.  After you shampoo, rinse your hair and body thoroughly to remove the  shampoo.                           4.  Use CHG as you would any other liquid soap.  You can apply chg directly  to the skin and wash                       Gently with a scrungie or  clean washcloth.  5.  Apply the CHG Soap to your body ONLY FROM THE NECK DOWN.   Do not use on face/ open                           Wound or open sores. Avoid contact with eyes, ears mouth and genitals (private parts).                       Wash face,  Genitals (private parts) with your normal soap.             6.  Wash thoroughly, paying special attention to the area where your surgery  will be performed.  7.  Thoroughly rinse your body with warm water from the neck down.  8.  DO NOT shower/wash with your normal soap after using and rinsing off  the CHG Soap.  9.  Pat yourself dry with a clean towel.            10.  Wear clean pajamas.            11.  Place clean sheets on your bed the night of your first shower and do not  sleep with pets. Day of Surgery : Do not apply any lotions/deodorants the morning of surgery.  Please wear clean clothes to the hospital/surgery center.  FAILURE TO FOLLOW THESE INSTRUCTIONS MAY RESULT IN THE CANCELLATION OF YOUR SURGERY PATIENT SIGNATURE_________________________________  NURSE SIGNATURE__________________________________  ________________________________________________________________________

## 2021-10-24 NOTE — Progress Notes (Addendum)
Anesthesia Review:  PCP: DR Caren Griffins white- clearance- OV- 09/02/21  Cardiologist : none  Chest x-ray : Ct angio chest- 2020  EKG :10/26/21  Echo :2020  Stress test: Cardiac Cath :  Activity level: can do a flight of stairs without difficulty  Sleep Study/ CPAP : has cpap  Fasting Blood Sugar :      / Checks Blood Sugar -- times a day:   Blood Thinner/ Instructions /Last Dose: ASA / Instructions/ Last Dose :   Eliquis -  stop 5 days prior per pt  Positive for covid on 08/29/21 per pt .  Seen by Harlan Stains and placed on antiviral.

## 2021-10-26 ENCOUNTER — Other Ambulatory Visit: Payer: Self-pay

## 2021-10-26 ENCOUNTER — Encounter (HOSPITAL_COMMUNITY): Payer: Self-pay

## 2021-10-26 ENCOUNTER — Encounter (HOSPITAL_COMMUNITY)
Admission: RE | Admit: 2021-10-26 | Discharge: 2021-10-26 | Disposition: A | Discharge status: Home / Self Care | Payer: Medicare HMO | Source: Ambulatory Visit | Attending: Orthopedic Surgery | Admitting: Orthopedic Surgery

## 2021-10-26 VITALS — BP 147/87 | HR 58 | Temp 98.0°F | Resp 16 | Ht 60.0 in

## 2021-10-26 DIAGNOSIS — Z01818 Encounter for other preprocedural examination: Secondary | ICD-10-CM | POA: Diagnosis not present

## 2021-10-26 DIAGNOSIS — M1712 Unilateral primary osteoarthritis, left knee: Secondary | ICD-10-CM | POA: Diagnosis not present

## 2021-10-26 HISTORY — DX: Other pulmonary embolism without acute cor pulmonale: I26.99

## 2021-10-26 HISTORY — DX: Sleep apnea, unspecified: G47.30

## 2021-10-26 LAB — COMPREHENSIVE METABOLIC PANEL WITH GFR
ALT: 19 U/L (ref 0–44)
AST: 21 U/L (ref 15–41)
Albumin: 4.3 g/dL (ref 3.5–5.0)
Alkaline Phosphatase: 70 U/L (ref 38–126)
Anion gap: 8 (ref 5–15)
BUN: 19 mg/dL (ref 8–23)
CO2: 30 mmol/L (ref 22–32)
Calcium: 10.3 mg/dL (ref 8.9–10.3)
Chloride: 103 mmol/L (ref 98–111)
Creatinine, Ser: 0.83 mg/dL (ref 0.44–1.00)
GFR, Estimated: >60 mL/min
Glucose, Bld: 104 mg/dL — ABNORMAL HIGH (ref 70–99)
Potassium: 4.3 mmol/L (ref 3.5–5.1)
Sodium: 141 mmol/L (ref 135–145)
Total Bilirubin: 0.4 mg/dL (ref 0.3–1.2)
Total Protein: 7.6 g/dL (ref 6.5–8.1)

## 2021-10-26 LAB — CBC
HCT: 40.9 % (ref 36.0–46.0)
Hemoglobin: 13.2 g/dL (ref 12.0–15.0)
MCH: 31.4 pg (ref 26.0–34.0)
MCHC: 32.3 g/dL (ref 30.0–36.0)
MCV: 97.4 fL (ref 80.0–100.0)
Platelets: 343 10*3/uL (ref 150–400)
RBC: 4.2 MIL/uL (ref 3.87–5.11)
RDW: 14.5 % (ref 11.5–15.5)
WBC: 7.4 10*3/uL (ref 4.0–10.5)
nRBC: 0 % (ref 0.0–0.2)

## 2021-10-26 LAB — PROTIME-INR
INR: 1.1 (ref 0.8–1.2)
Prothrombin Time: 14.2 seconds (ref 11.4–15.2)

## 2021-10-26 LAB — SURGICAL PCR SCREEN
MRSA, PCR: NEGATIVE
Staphylococcus aureus: NEGATIVE

## 2021-11-05 NOTE — H&P (Signed)
TOTAL KNEE ADMISSION H&P  Patient is being admitted for right total knee arthroplasty.  Subjective:  Chief Complaint: Right knee pain.  HPI: Cassandra [name], 76 y.o. female has a history of pain and functional disability in the right knee due to arthritis and has failed non-surgical conservative treatments for greater than 12 weeks to include NSAID's and/or analgesics and activity modification. Onset of symptoms was gradual, starting  several  years ago with gradually worsening course since that time. The patient noted no past surgery on the right knee.  Patient currently rates pain in the right knee at 7 out of 10 with activity. Patient has worsening of pain with activity and weight bearing, pain that interferes with activities of daily living, pain with passive range of motion, and crepitus. Patient has evidence of periarticular osteophytes and joint space narrowing by imaging studies. There is no active infection.  Patient Active Problem List   Diagnosis Date Noted   OA (osteoarthritis) of knee 12/19/2020   Primary osteoarthritis of left knee 12/19/2020   Obstructive sleep apnea 06/19/2019   Malignant neoplasm of overlapping sites of right breast in female, estrogen receptor positive ([place]) 05/14/2019   Coagulopathy ([place]) 05/14/2019   Morbid obesity ([place]) 05/14/2019   Chronic thromboembolic disease ([place]) 36/14/4315   Pulmonary vascular disease ([place]) 04/08/2019   Hypothyroidism 04/07/2019   Essential hypertension 04/07/2019   Pulmonary embolism ([place]) 04/06/2019   Abnormal liver function 04/06/2019   Hypokalemia 04/06/2019   Incisional hernia 04/06/2016    Past Medical History:  Diagnosis Date   Arthritis    Cancer [facility])    Breast cancer right '06- surgery, chemo, radiation- released after 5 yrs   Dyspnea    in 2020 had pe placed on eliquis   Hx of seasonal allergies    OTC med used   Hypertension    Hypothyroidism    Pulmonary embolism ([place])    hx of in 2020 placed on  eliquis   Sleep apnea    cpap    Past Surgical History:  Procedure Laterality Date   BREAST SURGERY Right    modified partial mastectomy/ lymph nodes dissection   COLON SURGERY  2/17   INCISIONAL HERNIA REPAIR N/A 04/06/2016   Procedure: LAPAROSCOPIC ASSISTED INCISIONAL HERNIA REPAIR WITH MESH;  Surgeon: Leighton Ruff, MD;  Location[facility];  Service: General;  Laterality: N/A;   TONSILLECTOMY     TOTAL KNEE ARTHROPLASTY Left 12/19/2020   Procedure: TOTAL KNEE ARTHROPLASTY;  Surgeon: Gaynelle Arabian, MD;  Location: [facility];  Service: Orthopedics;  Laterality: Left;  80min    Prior to Admission medications   Medication Sig Start Date End Date Taking? Authorizing Provider  acetaminophen (TYLENOL) 500 MG tablet Take 1,000 mg by mouth every 6 (six) hours as needed (for pain.).   Yes [provider]  apixaban (ELIQUIS) 5 MG TABS tablet Take 5 mg by mouth 2 (two) times daily.   Yes [provider]  atorvastatin (LIPITOR) 80 MG tablet Take 80 mg by mouth every evening.   Yes [provider]  b complex vitamins capsule Take 1 capsule by mouth in the morning.   Yes [provider]  benazepril (LOTENSIN) 40 MG tablet Take 40 mg by mouth in the morning. Pt takes in the pm per pt 03/16/16  Yes [provider]  bisoprolol-hydrochlorothiazide (ZIAC) 10-6.25 MG tablet Take 1 tablet by mouth every morning.   Yes [provider]  CALCIUM PO Take 600 mg by mouth 2 (two) times  daily.   Yes [provider]  cholecalciferol (VITAMIN D) 25 MCG (1000 UNIT) tablet Take 1,000 Units by mouth in the morning and at bedtime.   Yes [provider]  Coenzyme Q10 (CO Q 10 PO) Take 100 mg by mouth every morning.   Yes [provider]  levothyroxine (SYNTHROID, LEVOTHROID) 100 MCG tablet Take 100 mcg by mouth daily before breakfast.   Yes [provider]  loratadine (CLARITIN) 10 MG tablet Take 10 mg by mouth every morning.   Yes  [provider]  Lysine 500 MG CAPS Take 500 mg by mouth every morning.   Yes [provider]  Multiple Vitamin (MULTIVITAMIN WITH MINERALS) TABS tablet Take 1 tablet by mouth every morning.   Yes [provider]  NIFEdipine (ADALAT CC) 30 MG 24 hr tablet Take 30 mg by mouth daily before breakfast. 03/17/19  Yes [provider]  Omega-3 Fatty Acids (FISH OIL) 1000 MG CAPS Take 1,000 mg by mouth every morning. 06/26/12  Yes [provider]  TURMERIC PO Take 1 capsule by mouth every evening.   Yes [provider]  Vitamin A 2400 MCG (8000 UT) CAPS Take 8,000 Units by mouth in the morning.   Yes [provider]  vitamin E 180 MG (400 UNITS) capsule Take 400 Units by mouth in the morning.   Yes [provider]    No Known Allergies  Social History   Socioeconomic History   Marital status: Single    Spouse name: Not on file   Number of children: Not on file   Years of education: Not on file   Highest education level: Not on file  Occupational History   Not on file  Tobacco Use   Smoking status: Never   Smokeless tobacco: Never  Vaping Use   Vaping Use: Never used  Substance and Sexual Activity   Alcohol use: Yes    Comment: rare   Drug use: No   Sexual activity: Not on file  Other Topics Concern   Not on file  Social History Narrative   Not on file   Social Determinants of Health   Financial Resource Strain: Not on file  Food Insecurity: Not on file  Transportation Needs: Not on file  Physical Activity: Not on file  Stress: Not on file  Social Connections: Not on file  Intimate Partner Violence: Not on file    Tobacco Use: Low Risk    Smoking Tobacco Use: Never   Smokeless Tobacco Use: Never   Passive Exposure: Not on file   Social History   Substance and Sexual Activity  Alcohol Use Yes   Comment: rare    Family History  Problem Relation Age of Onset   COPD Father      ROS: Constitutional: no fever, no chills, no night sweats, no significant weight loss Cardiovascular: no chest pain, no palpitations Respiratory: no cough, no shortness of breath, No COPD Gastrointestinal: no vomiting, no nausea Musculoskeletal: no swelling in Joints, Joint Pain Neurologic: no numbness, no tingling, no difficulty with balance   Objective:  Physical Exam: Well nourished and well developed.  General: Alert and oriented x3, cooperative and pleasant, no acute distress.  Head: normocephalic, atraumatic, neck supple.  Eyes: EOMI.  Respiratory: breath sounds clear in all fields, no wheezing, rales, or rhonchi. Cardiovascular: Regular rate and rhythm, no murmurs, gallops or rubs.  Abdomen: non-tender to palpation and soft, normoactive bowel sounds. Musculoskeletal:  Antalgic gait pattern to the RIGHT,  ambulating with a cane    Left Knee Exam:  Minimal swelling.  Well healed incision, dry and clean with no drainage or tenderness.  AROM 0-114 degrees  No lateral instability with knee in 30 degrees of flexion or full extension.  Minimal AP play.   Calves soft and nontender. Motor function intact in LE. Strength 5/5 LE bilaterally. Neuro: Distal pulses 2+. Sensation to light touch intact in LE.    Vital signs in last 24 hours:    Imaging Review Radiographs- AP and lateral of the left knee show the prosthesis in excellent position with no periprosthetic abnormalities.   Assessment/Plan:  End stage arthritis, right knee   The patient history, physical examination, clinical judgment of the provider and imaging studies are consistent with end stage degenerative joint disease of the right knee and total knee arthroplasty is deemed medically necessary. The treatment options including medical management, injection therapy arthroscopy and arthroplasty were discussed at length. The risks and benefits of total knee arthroplasty were presented and reviewed. The risks due  to aseptic loosening, infection, stiffness, patella tracking problems, thromboembolic complications and other imponderables were discussed. The patient acknowledged the explanation, agreed to proceed with the plan and consent was signed. Patient is being admitted for inpatient treatment for surgery, pain control, PT, OT, prophylactic antibiotics, VTE prophylaxis, progressive ambulation and ADLs and discharge planning. The patient is planning to be discharged  home .   Patient's anticipated LOS is less than 2 midnights, meeting these requirements: - Younger than 41 - Lives within 1 hour of care - Has a competent adult at home to recover with post-op recover - NO history of  - Chronic pain requiring opiods  - Diabetes  - Coronary Artery Disease  - Heart failure  - Heart attack  - Stroke  - DVT/VTE  - Cardiac arrhythmia  - Respiratory Failure/COPD  - Renal failure  - Anemia  - Advanced Liver disease    Therapy Plans: [facility] Disposition: Home with Neighbor Planned DVT Prophylaxis: Eliquis 5 mg BID (hx PE) DME Needed: None PCP: Harlan Stains, MD (clearance received) Pulmonologist: Court Joy  TXA: Topical Allergies: None Anesthesia Concerns: Sleep Apnea on CPAP BMI: 39.9 Last HgbA1c: n/a  Pharmacy: [facility] in [place]  - Patient was instructed on what medications to stop prior to surgery. - Follow-up visit in 2 weeks with Dr. Wynelle Link - Begin physical therapy following surgery - Pre-operative [name] work as pre-surgical testing - Prescriptions will be provided in hospital at time of discharge  Fenton Foy, [facility], PA-C Orthopedic Surgery [facility] [place]

## 2021-11-06 ENCOUNTER — Ambulatory Visit (HOSPITAL_COMMUNITY): Payer: Medicare HMO | Admitting: Emergency Medicine

## 2021-11-06 ENCOUNTER — Ambulatory Visit (HOSPITAL_BASED_OUTPATIENT_CLINIC_OR_DEPARTMENT_OTHER): Payer: Medicare HMO | Admitting: Anesthesiology

## 2021-11-06 ENCOUNTER — Other Ambulatory Visit: Payer: Self-pay

## 2021-11-06 ENCOUNTER — Observation Stay (HOSPITAL_COMMUNITY)
Admission: RE | Admit: 2021-11-06 | Discharge: 2021-11-07 | Disposition: A | Discharge status: Home / Self Care | Payer: Medicare HMO | Source: Ambulatory Visit | Attending: Orthopedic Surgery | Admitting: Orthopedic Surgery

## 2021-11-06 ENCOUNTER — Encounter (HOSPITAL_COMMUNITY)
Admission: RE | Disposition: A | Discharge status: Home / Self Care | Payer: Self-pay | Source: Ambulatory Visit | Attending: Orthopedic Surgery

## 2021-11-06 ENCOUNTER — Encounter (HOSPITAL_COMMUNITY): Payer: Self-pay | Admitting: Orthopedic Surgery

## 2021-11-06 DIAGNOSIS — Z6839 Body mass index (BMI) 39.0-39.9, adult: Secondary | ICD-10-CM | POA: Insufficient documentation

## 2021-11-06 DIAGNOSIS — Z79899 Other long term (current) drug therapy: Secondary | ICD-10-CM | POA: Diagnosis not present

## 2021-11-06 DIAGNOSIS — Z96652 Presence of left artificial knee joint: Secondary | ICD-10-CM | POA: Diagnosis not present

## 2021-11-06 DIAGNOSIS — E039 Hypothyroidism, unspecified: Secondary | ICD-10-CM | POA: Insufficient documentation

## 2021-11-06 DIAGNOSIS — G4733 Obstructive sleep apnea (adult) (pediatric): Secondary | ICD-10-CM | POA: Diagnosis not present

## 2021-11-06 DIAGNOSIS — Z853 Personal history of malignant neoplasm of breast: Secondary | ICD-10-CM | POA: Diagnosis not present

## 2021-11-06 DIAGNOSIS — Z9989 Dependence on other enabling machines and devices: Secondary | ICD-10-CM | POA: Diagnosis not present

## 2021-11-06 DIAGNOSIS — Z01818 Encounter for other preprocedural examination: Secondary | ICD-10-CM

## 2021-11-06 DIAGNOSIS — M1711 Unilateral primary osteoarthritis, right knee: Principal | ICD-10-CM | POA: Insufficient documentation

## 2021-11-06 DIAGNOSIS — Z20822 Contact with and (suspected) exposure to covid-19: Secondary | ICD-10-CM | POA: Diagnosis not present

## 2021-11-06 DIAGNOSIS — M1712 Unilateral primary osteoarthritis, left knee: Secondary | ICD-10-CM | POA: Diagnosis present

## 2021-11-06 DIAGNOSIS — Z7901 Long term (current) use of anticoagulants: Secondary | ICD-10-CM | POA: Insufficient documentation

## 2021-11-06 DIAGNOSIS — G8918 Other acute postprocedural pain: Secondary | ICD-10-CM | POA: Diagnosis not present

## 2021-11-06 DIAGNOSIS — I10 Essential (primary) hypertension: Secondary | ICD-10-CM

## 2021-11-06 HISTORY — PX: TOTAL KNEE ARTHROPLASTY: SHX125

## 2021-11-06 LAB — SARS CORONAVIRUS 2 BY RT PCR (HOSPITAL ORDER, PERFORMED IN ~~LOC~~ HOSPITAL LAB): SARS Coronavirus 2: NEGATIVE

## 2021-11-06 SURGERY — ARTHROPLASTY, KNEE, TOTAL
Anesthesia: Spinal | Site: Knee | Laterality: Right

## 2021-11-06 MED ORDER — METHOCARBAMOL 500 MG PO TABS
500.0000 mg | ORAL_TABLET | Freq: Four times a day (QID) | ORAL | Status: DC | PRN
Start: 2021-11-06 — End: 2021-11-07
  Administered 2021-11-06 – 2021-11-07 (×2): 500 mg via ORAL
  Filled 2021-11-06 (×2): qty 1

## 2021-11-06 MED ORDER — ONDANSETRON HCL 4 MG/2ML IJ SOLN: 4.0000 mg | Freq: Once | INTRAMUSCULAR | Status: DC | PRN

## 2021-11-06 MED ORDER — ACETAMINOPHEN 500 MG PO TABS
1000.0000 mg | ORAL_TABLET | Freq: Four times a day (QID) | ORAL | Status: AC
  Administered 2021-11-06 – 2021-11-07 (×4): 1000 mg via ORAL
  Filled 2021-11-06 (×5): qty 2

## 2021-11-06 MED ORDER — ACETAMINOPHEN 10 MG/ML IV SOLN
1000.0000 mg | Freq: Once | INTRAVENOUS | Status: DC
  Filled 2021-11-06: qty 100

## 2021-11-06 MED ORDER — SODIUM CHLORIDE (PF) 0.9 % IJ SOLN
INTRAMUSCULAR | Status: DC | PRN
  Administered 2021-11-06: 60 mL

## 2021-11-06 MED ORDER — MORPHINE SULFATE (PF) 2 MG/ML IV SOLN: 0.5000 mg | INTRAVENOUS | Status: DC | PRN

## 2021-11-06 MED ORDER — ATORVASTATIN CALCIUM 40 MG PO TABS: 80.0000 mg | ORAL_TABLET | Freq: Every evening | ORAL | Status: DC

## 2021-11-06 MED ORDER — FENTANYL CITRATE PF 50 MCG/ML IJ SOSY
50.0000 ug | PREFILLED_SYRINGE | INTRAMUSCULAR | Status: DC
  Administered 2021-11-06: 50 ug via INTRAVENOUS
  Filled 2021-11-06: qty 2

## 2021-11-06 MED ORDER — POLYETHYLENE GLYCOL 3350 17 G PO PACK: 17.0000 g | PACK | Freq: Every day | ORAL | Status: DC | PRN

## 2021-11-06 MED ORDER — ORAL CARE MOUTH RINSE: 15.0000 mL | Freq: Once | OROMUCOSAL | Status: AC

## 2021-11-06 MED ORDER — FLEET ENEMA 7-19 GM/118ML RE ENEM: 1.0000 | ENEMA | Freq: Once | RECTAL | Status: DC | PRN

## 2021-11-06 MED ORDER — SODIUM CHLORIDE (PF) 0.9 % IJ SOLN
INTRAMUSCULAR | Status: AC
  Filled 2021-11-06: qty 10

## 2021-11-06 MED ORDER — SODIUM CHLORIDE 0.9 % IR SOLN
Status: DC | PRN
  Administered 2021-11-06: 1000 mL

## 2021-11-06 MED ORDER — MENTHOL 3 MG MT LOZG: 1.0000 | LOZENGE | OROMUCOSAL | Status: DC | PRN

## 2021-11-06 MED ORDER — TRANEXAMIC ACID 1000 MG/10ML IV SOLN
2000.0000 mg | INTRAVENOUS | Status: DC
  Filled 2021-11-06: qty 20

## 2021-11-06 MED ORDER — LEVOTHYROXINE SODIUM 100 MCG PO TABS
100.0000 ug | ORAL_TABLET | Freq: Every day | ORAL | Status: DC
  Administered 2021-11-07: 100 ug via ORAL
  Filled 2021-11-06: qty 1

## 2021-11-06 MED ORDER — PROPOFOL 500 MG/50ML IV EMUL
INTRAVENOUS | Status: DC | PRN
  Administered 2021-11-06: 75 ug/kg/min via INTRAVENOUS

## 2021-11-06 MED ORDER — CEFAZOLIN SODIUM-DEXTROSE 2-4 GM/100ML-% IV SOLN
2.0000 g | Freq: Four times a day (QID) | INTRAVENOUS | Status: AC
  Administered 2021-11-06 – 2021-11-07 (×2): 2 g via INTRAVENOUS
  Filled 2021-11-06 (×2): qty 100

## 2021-11-06 MED ORDER — ONDANSETRON HCL 4 MG PO TABS: 4.0000 mg | ORAL_TABLET | Freq: Four times a day (QID) | ORAL | Status: DC | PRN

## 2021-11-06 MED ORDER — BUPIVACAINE IN DEXTROSE 0.75-8.25 % IT SOLN
INTRATHECAL | Status: DC | PRN
  Administered 2021-11-06: 1.6 mL via INTRATHECAL

## 2021-11-06 MED ORDER — BUPIVACAINE LIPOSOME 1.3 % IJ SUSP
INTRAMUSCULAR | Status: AC
  Filled 2021-11-06: qty 20

## 2021-11-06 MED ORDER — PHENYLEPHRINE 40 MCG/ML (10ML) SYRINGE FOR IV PUSH (FOR BLOOD PRESSURE SUPPORT)
PREFILLED_SYRINGE | INTRAVENOUS | Status: DC | PRN
  Administered 2021-11-06 (×2): 80 ug via INTRAVENOUS

## 2021-11-06 MED ORDER — METHOCARBAMOL 500 MG IVPB - SIMPLE MED
INTRAVENOUS | Status: AC
  Filled 2021-11-06: qty 50

## 2021-11-06 MED ORDER — LORATADINE 10 MG PO TABS
10.0000 mg | ORAL_TABLET | Freq: Every morning | ORAL | Status: DC
  Administered 2021-11-07: 10 mg via ORAL
  Filled 2021-11-06: qty 1

## 2021-11-06 MED ORDER — DOCUSATE SODIUM 100 MG PO CAPS
100.0000 mg | ORAL_CAPSULE | Freq: Two times a day (BID) | ORAL | Status: DC
  Administered 2021-11-06 – 2021-11-07 (×3): 100 mg via ORAL
  Filled 2021-11-06 (×3): qty 1

## 2021-11-06 MED ORDER — TRAMADOL HCL 50 MG PO TABS
50.0000 mg | ORAL_TABLET | Freq: Four times a day (QID) | ORAL | Status: DC | PRN
  Administered 2021-11-07: 50 mg via ORAL
  Filled 2021-11-06: qty 1

## 2021-11-06 MED ORDER — FENTANYL CITRATE PF 50 MCG/ML IJ SOSY: 25.0000 ug | PREFILLED_SYRINGE | INTRAMUSCULAR | Status: DC | PRN

## 2021-11-06 MED ORDER — NIFEDIPINE ER OSMOTIC RELEASE 30 MG PO TB24
30.0000 mg | ORAL_TABLET | Freq: Every day | ORAL | Status: DC
  Administered 2021-11-07: 30 mg via ORAL
  Filled 2021-11-06: qty 1

## 2021-11-06 MED ORDER — OXYCODONE HCL 5 MG PO TABS
5.0000 mg | ORAL_TABLET | ORAL | Status: DC | PRN
  Administered 2021-11-06: 5 mg via ORAL
  Administered 2021-11-06: 10 mg via ORAL
  Administered 2021-11-07 (×2): 5 mg via ORAL
  Filled 2021-11-06: qty 1
  Filled 2021-11-06 (×2): qty 2
  Filled 2021-11-06: qty 1

## 2021-11-06 MED ORDER — GABAPENTIN 300 MG PO CAPS
300.0000 mg | ORAL_CAPSULE | Freq: Three times a day (TID) | ORAL | Status: DC
  Administered 2021-11-06 – 2021-11-07 (×4): 300 mg via ORAL
  Filled 2021-11-06 (×4): qty 1

## 2021-11-06 MED ORDER — SODIUM CHLORIDE 0.9 % IV SOLN: INTRAVENOUS | Status: DC

## 2021-11-06 MED ORDER — LACTATED RINGERS IV SOLN: INTRAVENOUS | Status: DC

## 2021-11-06 MED ORDER — CEFAZOLIN SODIUM-DEXTROSE 2-4 GM/100ML-% IV SOLN
2.0000 g | INTRAVENOUS | Status: AC
  Administered 2021-11-06: 2 g via INTRAVENOUS
  Filled 2021-11-06: qty 100

## 2021-11-06 MED ORDER — ONDANSETRON HCL 4 MG/2ML IJ SOLN
INTRAMUSCULAR | Status: DC | PRN
Start: 2021-11-06 — End: 2021-11-06
  Administered 2021-11-06: 4 mg via INTRAVENOUS

## 2021-11-06 MED ORDER — BUPIVACAINE LIPOSOME 1.3 % IJ SUSP
INTRAMUSCULAR | Status: DC | PRN
  Administered 2021-11-06: 20 mL

## 2021-11-06 MED ORDER — MIDAZOLAM HCL 2 MG/2ML IJ SOLN
1.0000 mg | INTRAMUSCULAR | Status: DC
  Filled 2021-11-06: qty 2

## 2021-11-06 MED ORDER — ROPIVACAINE HCL 5 MG/ML IJ SOLN
INTRAMUSCULAR | Status: DC | PRN
  Administered 2021-11-06: 30 mL via PERINEURAL

## 2021-11-06 MED ORDER — BISACODYL 10 MG RE SUPP: 10.0000 mg | Freq: Every day | RECTAL | Status: DC | PRN

## 2021-11-06 MED ORDER — BUPIVACAINE LIPOSOME 1.3 % IJ SUSP: 20.0000 mL | Freq: Once | INTRAMUSCULAR | Status: DC

## 2021-11-06 MED ORDER — BISOPROLOL-HYDROCHLOROTHIAZIDE 10-6.25 MG PO TABS
1.0000 | ORAL_TABLET | Freq: Every morning | ORAL | Status: DC
  Administered 2021-11-06 – 2021-11-07 (×2): 1 via ORAL
  Filled 2021-11-06 (×2): qty 1

## 2021-11-06 MED ORDER — METOCLOPRAMIDE HCL 5 MG/ML IJ SOLN: 5.0000 mg | Freq: Three times a day (TID) | INTRAMUSCULAR | Status: DC | PRN

## 2021-11-06 MED ORDER — DEXAMETHASONE SODIUM PHOSPHATE 10 MG/ML IJ SOLN
10.0000 mg | Freq: Once | INTRAMUSCULAR | Status: AC
  Administered 2021-11-07: 10 mg via INTRAVENOUS
  Filled 2021-11-06: qty 1

## 2021-11-06 MED ORDER — METHOCARBAMOL 500 MG IVPB - SIMPLE MED
500.0000 mg | Freq: Four times a day (QID) | INTRAVENOUS | Status: DC | PRN
  Filled 2021-11-06: qty 50

## 2021-11-06 MED ORDER — DIPHENHYDRAMINE HCL 12.5 MG/5ML PO ELIX: 12.5000 mg | ORAL_SOLUTION | ORAL | Status: DC | PRN

## 2021-11-06 MED ORDER — DEXAMETHASONE SODIUM PHOSPHATE 10 MG/ML IJ SOLN: 8.0000 mg | Freq: Once | INTRAMUSCULAR | Status: DC

## 2021-11-06 MED ORDER — METOCLOPRAMIDE HCL 5 MG PO TABS: 5.0000 mg | ORAL_TABLET | Freq: Three times a day (TID) | ORAL | Status: DC | PRN

## 2021-11-06 MED ORDER — APIXABAN 2.5 MG PO TABS
2.5000 mg | ORAL_TABLET | Freq: Two times a day (BID) | ORAL | Status: DC
  Administered 2021-11-07: 2.5 mg via ORAL
  Filled 2021-11-06: qty 1

## 2021-11-06 MED ORDER — ONDANSETRON HCL 4 MG/2ML IJ SOLN: 4.0000 mg | Freq: Four times a day (QID) | INTRAMUSCULAR | Status: DC | PRN

## 2021-11-06 MED ORDER — PHENOL 1.4 % MT LIQD: 1.0000 | OROMUCOSAL | Status: DC | PRN

## 2021-11-06 MED ORDER — CHLORHEXIDINE GLUCONATE 0.12 % MT SOLN
15.0000 mL | Freq: Once | OROMUCOSAL | Status: AC
  Administered 2021-11-06: 15 mL via OROMUCOSAL

## 2021-11-06 MED ORDER — POVIDONE-IODINE 10 % EX SWAB
2.0000 "application " | Freq: Once | CUTANEOUS | Status: AC
  Administered 2021-11-06: 2 via TOPICAL

## 2021-11-06 MED ORDER — DEXAMETHASONE SODIUM PHOSPHATE 10 MG/ML IJ SOLN
INTRAMUSCULAR | Status: DC | PRN
  Administered 2021-11-06: 10 mg

## 2021-11-06 MED ORDER — TRANEXAMIC ACID 1000 MG/10ML IV SOLN
INTRAVENOUS | Status: DC | PRN
  Administered 2021-11-06: 2000 mg via TOPICAL

## 2021-11-06 SURGICAL SUPPLY — 56 items
ATTUNE PS FEM RT SZ 4 CEM KNEE (Femur) ×1 IMPLANT
ATTUNE PSRP INSR SZ4 12 KNEE (Insert) ×1 IMPLANT
BAG COUNTER SPONGE SURGICOUNT (BAG) IMPLANT
BAG SPEC THK2 15X12 ZIP CLS (MISCELLANEOUS) ×1
BAG SPNG CNTER NS LX DISP (BAG)
BAG ZIPLOCK 12X15 (MISCELLANEOUS) ×3 IMPLANT
BASEPLATE TIBIAL ROTATING SZ 4 (Knees) ×1 IMPLANT
BLADE SAG 18X100X1.27 (BLADE) ×3 IMPLANT
BLADE SAW SGTL 11.0X1.19X90.0M (BLADE) ×3 IMPLANT
BNDG CMPR MED 10X6 ELC LF (GAUZE/BANDAGES/DRESSINGS) ×1
BNDG ELASTIC 6X10 VLCR STRL LF (GAUZE/BANDAGES/DRESSINGS) ×1 IMPLANT
BNDG ELASTIC 6X5.8 VLCR STR LF (GAUZE/BANDAGES/DRESSINGS) ×3 IMPLANT
BOWL SMART MIX CTS (DISPOSABLE) ×3 IMPLANT
BSPLAT TIB 4 CMNT ROT PLAT STR (Knees) ×1 IMPLANT
CEMENT HV SMART SET (Cement) ×6 IMPLANT
CLSR STERI-STRIP ANTIMIC 1/2X4 (GAUZE/BANDAGES/DRESSINGS) ×1 IMPLANT
COVER SURGICAL LIGHT HANDLE (MISCELLANEOUS) ×3 IMPLANT
CUFF TOURN SGL QUICK 34 (TOURNIQUET CUFF) ×2
CUFF TRNQT CYL 34X4.125X (TOURNIQUET CUFF) ×2 IMPLANT
DRAPE INCISE IOBAN 66X45 STRL (DRAPES) ×3 IMPLANT
DRAPE U-SHAPE 47X51 STRL (DRAPES) ×3 IMPLANT
DRSG AQUACEL AG ADV 3.5X10 (GAUZE/BANDAGES/DRESSINGS) ×3 IMPLANT
DURAPREP 26ML APPLICATOR (WOUND CARE) ×3 IMPLANT
ELECT REM PT RETURN 15FT ADLT (MISCELLANEOUS) ×3 IMPLANT
GLOVE SRG 8 PF TXTR STRL LF DI (GLOVE) ×2 IMPLANT
GLOVE SURG ENC MOIS LTX SZ6.5 (GLOVE) ×3 IMPLANT
GLOVE SURG ENC MOIS LTX SZ8 (GLOVE) ×6 IMPLANT
GLOVE SURG UNDER POLY LF SZ7 (GLOVE) ×3 IMPLANT
GLOVE SURG UNDER POLY LF SZ8 (GLOVE) ×2
GLOVE SURG UNDER POLY LF SZ8.5 (GLOVE) ×3 IMPLANT
GOWN STRL REUS W/TWL LRG LVL3 (GOWN DISPOSABLE) ×6 IMPLANT
GOWN STRL REUS W/TWL XL LVL3 (GOWN DISPOSABLE) ×3 IMPLANT
HANDPIECE INTERPULSE COAX TIP (DISPOSABLE) ×2
HOLDER FOLEY CATH W/STRAP (MISCELLANEOUS) IMPLANT
IMMOBILIZER KNEE 20 (SOFTGOODS) ×2
IMMOBILIZER KNEE 20 THIGH 36 (SOFTGOODS) ×2 IMPLANT
KIT TURNOVER KIT A (KITS) IMPLANT
MANIFOLD NEPTUNE II (INSTRUMENTS) ×3 IMPLANT
NS IRRIG 1000ML POUR BTL (IV SOLUTION) ×3 IMPLANT
PACK TOTAL KNEE CUSTOM (KITS) ×3 IMPLANT
PADDING CAST COTTON 6X4 STRL (CAST SUPPLIES) ×5 IMPLANT
PATELLA MEDIAL ATTUN 35MM KNEE (Knees) ×1 IMPLANT
PROTECTOR NERVE ULNAR (MISCELLANEOUS) ×3 IMPLANT
SET HNDPC FAN SPRY TIP SCT (DISPOSABLE) ×2 IMPLANT
SPIKE FLUID TRANSFER (MISCELLANEOUS) ×3 IMPLANT
SPONGE T-LAP 18X18 ~~LOC~~+RFID (SPONGE) ×9 IMPLANT
STRIP CLOSURE SKIN 1/2X4 (GAUZE/BANDAGES/DRESSINGS) ×6 IMPLANT
SUT MNCRL AB 4-0 PS2 18 (SUTURE) ×3 IMPLANT
SUT STRATAFIX 0 PDS 27 VIOLET (SUTURE) ×2
SUT VIC AB 2-0 CT1 27 (SUTURE) ×6
SUT VIC AB 2-0 CT1 TAPERPNT 27 (SUTURE) ×6 IMPLANT
SUTURE STRATFX 0 PDS 27 VIOLET (SUTURE) ×2 IMPLANT
TRAY FOLEY MTR SLVR 16FR STAT (SET/KITS/TRAYS/PACK) ×3 IMPLANT
TUBE SUCTION HIGH CAP CLEAR NV (SUCTIONS) ×3 IMPLANT
WATER STERILE IRR 1000ML POUR (IV SOLUTION) ×6 IMPLANT
WRAP KNEE MAXI GEL POST OP (GAUZE/BANDAGES/DRESSINGS) ×3 IMPLANT

## 2021-11-06 NOTE — Op Note (Signed)
OPERATIVE REPORT-TOTAL KNEE ARTHROPLASTY   Pre-operative diagnosis- Osteoarthritis  Right knee(s)  Post-operative diagnosis- Osteoarthritis Right knee(s)  Procedure-  Right  Total Knee Arthroplasty  Surgeon- Dione Plover. Everett Ricciardelli, MD  Assistant- Fenton Foy, PA-C   Anesthesia-   Adductor canal block and spinal  EBL-50 mL   Drains None  Tourniquet time-  Total Tourniquet Time Documented: Thigh (Right) - 35 minutes Total: Thigh (Right) - 35 minutes     Complications- None  Condition-PACU - hemodynamically stable.   Brief Clinical Note  Cassandra [name] is a 76 y.o. year old female with end stage OA of her right knee with progressively worsening pain and dysfunction. She has constant pain, with activity and at rest and significant functional deficits with difficulties even with ADLs. She has had extensive non-op management including analgesics, injections of cortisone and viscosupplements, and home exercise program, but remains in significant pain with significant dysfunction.Radiographs show bone on bone arthritis medial and patellofemoral. She presents now for right Total Knee Arthroplasty.     Procedure in detail---   The patient is brought into the operating room and positioned supine on the operating table. After successful administration of  Adductor canal block and spinal,   a tourniquet is placed high on the  Right thigh(s) and the lower extremity is prepped and draped in the usual sterile fashion. Time out is performed by the operating team and then the  Right lower extremity is wrapped in Esmarch, knee flexed and the tourniquet inflated to 300 mmHg.       A midline incision is made with a ten blade through the subcutaneous tissue to the level of the extensor mechanism. A fresh blade is used to make a medial parapatellar arthrotomy. Soft tissue over the proximal medial tibia is subperiosteally elevated to the joint line with a knife and into the semimembranosus bursa with a  Cobb elevator. Soft tissue over the proximal lateral tibia is elevated with attention being paid to avoiding the patellar tendon on the tibial tubercle. The patella is everted, knee flexed 90 degrees and the ACL and PCL are removed. Findings are bone on bone medial and patellofemoral with large global osteophytes.        The drill is used to create a starting hole in the distal femur and the canal is thoroughly irrigated with sterile saline to remove the fatty contents. The 5 degree Right  valgus alignment guide is placed into the femoral canal and the distal femoral cutting block is pinned to remove 9 mm off the distal femur. Resection is made with an oscillating saw.      The tibia is subluxed forward and the menisci are removed. The extramedullary alignment guide is placed referencing proximally at the medial aspect of the tibial tubercle and distally along the second metatarsal axis and tibial crest. The block is pinned to remove 58mm off the more deficient medial  side. Resection is made with an oscillating saw. Size 4is the most appropriate size for the tibia and the proximal tibia is prepared with the modular drill and keel punch for that size.      The femoral sizing guide is placed and size 4 is most appropriate. Rotation is marked off the epicondylar axis and confirmed by creating a rectangular flexion gap at 90 degrees. The size 4 cutting block is pinned in this rotation and the anterior, posterior and chamfer cuts are made with the oscillating saw. The intercondylar block is then placed and that cut is made.  Trial size 4 tibial component, trial size 4 posterior stabilized femur and a 12  mm posterior stabilized rotating platform insert trial is placed. Full extension is achieved with excellent varus/valgus and anterior/posterior balance throughout full range of motion. The patella is everted and thickness measured to be 22  mm. Free hand resection is taken to 12 mm, a 35 template is placed, lug  holes are drilled, trial patella is placed, and it tracks normally. Osteophytes are removed off the posterior femur with the trial in place. All trials are removed and the cut bone surfaces prepared with pulsatile lavage. Cement is mixed and once ready for implantation, the size 4 tibial implant, size  4 posterior stabilized femoral component, and the size 35 patella are cemented in place and the patella is held with the clamp. The trial insert is placed and the knee held in full extension. The Exparel (20 ml mixed with 60 ml saline) is injected into the extensor mechanism, posterior capsule, medial and lateral gutters and subcutaneous tissues.  All extruded cement is removed and once the cement is hard the permanent 12 mm posterior stabilized rotating platform insert is placed into the tibial tray.      The wound is copiously irrigated with saline solution and the extensor mechanism closed with # 0 Stratofix suture. The tourniquet is released for a total tourniquet time of 34  minutes. Flexion against gravity is 140 degrees and the patella tracks normally. Subcutaneous tissue is closed with 2.0 vicryl and subcuticular with running 4.0 Monocryl. The incision is cleaned and dried and steri-strips and a bulky sterile dressing are applied. The limb is placed into a knee immobilizer and the patient is awakened and transported to recovery in stable condition.      Please note that a surgical assistant was a medical necessity for this procedure in order to perform it in a safe and expeditious manner. Surgical assistant was necessary to retract the ligaments and vital neurovascular structures to prevent injury to them and also necessary for proper positioning of the limb to allow for anatomic placement of the prosthesis.   Dione Plover Shamira Toutant, MD    11/06/2021, 1:55 PM

## 2021-11-06 NOTE — Progress Notes (Signed)
Orthopedic Tech Progress Note Patient Details:  Cassandra [name] 03-19-1946 [id number]  CPM Left Knee CPM Left Knee: On Left Knee Flexion (Degrees): 40 Left Knee Extension (Degrees): 10  Post Interventions Patient Tolerated: Well Instructions Provided: Care of device, Adjustment of device  [place] Pink 11/06/2021, 2:32 PM

## 2021-11-06 NOTE — Transfer of Care (Signed)
Immediate Anesthesia Transfer of Care Note  Patient: Cassandra [name]  Procedure(s) Performed: Procedure(s): TOTAL KNEE ARTHROPLASTY (Right)  Patient Location: PACU  Anesthesia Type:Spinal  Level of Consciousness: awake, alert  and oriented  Airway & Oxygen Therapy: Patient Spontanous Breathing  Post-op Assessment: Report given to RN and Post -op Vital signs reviewed and stable  Post vital signs: Reviewed and stable  Last Vitals:  Vitals:   11/06/21 1200 11/06/21 1205  BP: 133/70 126/72  Pulse: 62 (!) 59  Resp: (!) 21 14  Temp:    SpO2: 92% 65%    Complications: No apparent anesthesia complications

## 2021-11-06 NOTE — Anesthesia Postprocedure Evaluation (Signed)
Anesthesia Post Note  Patient: Cassandra [name]  Procedure(s) Performed: TOTAL KNEE ARTHROPLASTY (Right: Knee)     Patient location during evaluation: PACU Anesthesia Type: Spinal Level of consciousness: awake, awake and alert and oriented Pain management: pain level controlled Vital Signs Assessment: post-procedure vital signs reviewed and stable Respiratory status: spontaneous breathing, nonlabored ventilation and respiratory function stable Cardiovascular status: blood pressure returned to baseline and stable Postop Assessment: no headache, no backache, spinal receding and no apparent nausea or vomiting Anesthetic complications: no   No notable events documented.  Last Vitals:  Vitals:   11/06/21 1500 11/06/21 1515  BP: 127/69 135/70  Pulse: (!) 58 64  Resp: 13 15  Temp:    SpO2: 99% 96%    Last Pain:  Vitals:   11/06/21 1515  TempSrc:   PainSc: 0-No pain                 Santa Lighter

## 2021-11-06 NOTE — Discharge Instructions (Signed)
 Frank Aluisio, MD Total Joint Specialist [facility] [place] [address] [place], [place] [address] [phone]  TOTAL KNEE REPLACEMENT POSTOPERATIVE DIRECTIONS    Knee Rehabilitation, Guidelines Following Surgery  Results after knee surgery are often greatly improved when you follow the exercise, range of motion and muscle strengthening exercises prescribed by your doctor. Safety measures are also important to protect the knee from further injury. If any of these exercises cause you to have increased pain or swelling in your knee joint, decrease the amount until you are comfortable again and slowly increase them. If you have problems or questions, call your caregiver or physical therapist for advice.   HOME CARE INSTRUCTIONS  Remove items at home which could result in a fall. This includes throw rugs or furniture in walking pathways.  ICE to the affected knee as much as tolerated. Icing helps control swelling. If the swelling is well controlled you will be more comfortable and rehab easier. Continue to use ice on the knee for pain and swelling from surgery. You may notice swelling that will progress down to the foot and ankle. This is normal after surgery. Elevate the leg when you are not up walking on it.    Continue to use the breathing machine which will help keep your temperature down. It is common for your temperature to cycle up and down following surgery, especially at night when you are not up moving around and exerting yourself. The breathing machine keeps your lungs expanded and your temperature down. Do not place pillow under the operative knee, focus on keeping the knee straight while resting  DIET You may resume your previous home diet once you are discharged from the hospital.  DRESSING / WOUND CARE / SHOWERING Keep your bulky bandage on for 2 days. On the third post-operative day you may remove the Ace bandage and gauze. There is a waterproof  adhesive bandage on your skin which will stay in place until your first follow-up appointment. Once you remove this you will not need to place another bandage You may begin showering 3 days following surgery, but do not submerge the incision under water.  ACTIVITY For the first 5 days, the key is rest and control of pain and swelling Do your home exercises twice a day starting on post-operative day 3. On the days you go to physical therapy, just do the home exercises once that day. You should rest, ice and elevate the leg for 50 minutes out of every hour. Get up and walk/stretch for 10 minutes per hour. After 5 days you can increase your activity slowly as tolerated. Walk with your walker as instructed. Use the walker until you are comfortable transitioning to a cane. Walk with the cane in the opposite hand of the operative leg. You may discontinue the cane once you are comfortable and walking steadily. Avoid periods of inactivity such as sitting longer than an hour when not asleep. This helps prevent blood clots.  You may discontinue the knee immobilizer once you are able to perform a straight leg raise while lying down. You may resume a sexual relationship in one month or when given the OK by your doctor.  You may return to work once you are cleared by your doctor.  Do not drive a car for 6 weeks or until released by your surgeon.  Do not drive while taking narcotics.  TED HOSE STOCKINGS Wear the elastic stockings on both legs for three weeks following surgery during the   day. You may remove them at night for sleeping.  WEIGHT BEARING Weight bearing as tolerated with assist device (walker, cane, etc) as directed, use it as long as suggested by your surgeon or therapist, typically at least 4-6 weeks.  POSTOPERATIVE CONSTIPATION PROTOCOL Constipation - defined medically as fewer than three stools per week and severe constipation as less than one stool per week.  One of the most common issues  patients have following surgery is constipation.  Even if you have a regular bowel pattern at home, your normal regimen is likely to be disrupted due to multiple reasons following surgery.  Combination of anesthesia, postoperative narcotics, change in appetite and fluid intake all can affect your bowels.  In order to avoid complications following surgery, here are some recommendations in order to help you during your recovery period.  Colace (docusate) - Pick up an over-the-counter form of Colace or another stool softener and take twice a day as long as you are requiring postoperative pain medications.  Take with a full glass of water daily.  If you experience loose stools or diarrhea, hold the colace until you stool forms back up. If your symptoms do not get better within 1 week or if they get worse, check with your doctor. Dulcolax (bisacodyl) - Pick up over-the-counter and take as directed by the product packaging as needed to assist with the movement of your bowels.  Take with a full glass of water.  Use this product as needed if not relieved by Colace only.  MiraLax (polyethylene glycol) - Pick up over-the-counter to have on hand. MiraLax is a solution that will increase the amount of water in your bowels to assist with bowel movements.  Take as directed and can mix with a glass of water, juice, soda, coffee, or tea. Take if you go more than two days without a movement. Do not use MiraLax more than once per day. Call your doctor if you are still constipated or irregular after using this medication for 7 days in a row.  If you continue to have problems with postoperative constipation, please contact the office for further assistance and recommendations.  If you experience "the worst abdominal pain ever" or develop nausea or vomiting, please contact the office immediatly for further recommendations for treatment.  ITCHING If you experience itching with your medications, try taking only a single pain  pill, or even half a pain pill at a time.  You can also use Benadryl over the counter for itching or also to help with sleep.   MEDICATIONS See your medication summary on the "After Visit Summary" that the nursing staff will review with you prior to discharge.  You may have some home medications which will be placed on hold until you complete the course of blood thinner medication.  It is important for you to complete the blood thinner medication as prescribed by your surgeon.  Continue your approved medications as instructed at time of discharge.  PRECAUTIONS If you experience chest pain or shortness of breath - call 911 immediately for transfer to the hospital emergency department.  If you develop a fever greater that 101 F, purulent drainage from wound, increased redness or drainage from wound, foul odor from the wound/dressing, or calf pain - CONTACT YOUR SURGEON.                                                     FOLLOW-UP APPOINTMENTS Make sure you keep all of your appointments after your operation with your surgeon and caregivers. You should call the office at the above phone number and make an appointment for approximately two weeks after the date of your surgery or on the date instructed by your surgeon outlined in the "After Visit Summary".  RANGE OF MOTION AND STRENGTHENING EXERCISES  Rehabilitation of the knee is important following a knee injury or an operation. After just a few days of immobilization, the muscles of the thigh which control the knee become weakened and shrink (atrophy). Knee exercises are designed to build up the tone and strength of the thigh muscles and to improve knee motion. Often times heat used for twenty to thirty minutes before working out will loosen up your tissues and help with improving the range of motion but do not use heat for the first two weeks following surgery. These exercises can be done on a training (exercise) mat, on the floor, on a table or on a bed.  Use what ever works the best and is most comfortable for you Knee exercises include:  Leg Lifts - While your knee is still immobilized in a splint or cast, you can do straight leg raises. Lift the leg to 60 degrees, hold for 3 sec, and slowly lower the leg. Repeat 10-20 times 2-3 times daily. Perform this exercise against resistance later as your knee gets better.  Quad and Hamstring Sets - Tighten up the muscle on the front of the thigh (Quad) and hold for 5-10 sec. Repeat this 10-20 times hourly. Hamstring sets are done by pushing the foot backward against an object and holding for 5-10 sec. Repeat as with quad sets.  Leg Slides: Lying on your back, slowly slide your foot toward your buttocks, bending your knee up off the floor (only go as far as is comfortable). Then slowly slide your foot back down until your leg is flat on the floor again. Angel Wings: Lying on your back spread your legs to the side as far apart as you can without causing discomfort.  A rehabilitation program following serious knee injuries can speed recovery and prevent re-injury in the future due to weakened muscles. Contact your doctor or a physical therapist for more information on knee rehabilitation.   POST-OPERATIVE OPIOID TAPER INSTRUCTIONS: It is important to wean off of your opioid medication as soon as possible. If you do not need pain medication after your surgery it is ok to stop day one. Opioids include: Codeine, Hydrocodone(Norco, Vicodin), Oxycodone(Percocet, oxycontin) and hydromorphone amongst others.  Long term and even short term use of opiods can cause: Increased pain response Dependence Constipation Depression Respiratory depression And more.  Withdrawal symptoms can include Flu like symptoms Nausea, vomiting And more Techniques to manage these symptoms Hydrate well Eat regular healthy meals Stay active Use relaxation techniques(deep breathing, meditating, yoga) Do Not substitute Alcohol to help  with tapering If you have been on opioids for less than two weeks and do not have pain than it is ok to stop all together.  Plan to wean off of opioids This plan should start within one week post op of your joint replacement. Maintain the same interval or time between taking each dose and first decrease the dose.  Cut the total daily intake of opioids by one tablet each day Next start to increase the time between doses. The last dose that should be eliminated is the evening dose.   IF YOU ARE TRANSFERRED TO   A SKILLED REHAB FACILITY If the patient is transferred to a skilled rehab facility following release from the hospital, a list of the current medications will be sent to the facility for the patient to continue.  When discharged from the skilled rehab facility, please have the facility set up the patient's Home Health Physical Therapy prior to being released. Also, the skilled facility will be responsible for providing the patient with their medications at time of release from the facility to include their pain medication, the muscle relaxants, and their blood thinner medication. If the patient is still at the rehab facility at time of the two week follow up appointment, the skilled rehab facility will also need to assist the patient in arranging follow up appointment in our office and any transportation needs.  MAKE SURE YOU:  Understand these instructions.  Get help right away if you are not doing well or get worse.   DENTAL ANTIBIOTICS:  In most cases prophylactic antibiotics for Dental procdeures after total joint surgery are not necessary.  Exceptions are as follows:  1. History of prior total joint infection  2. Severely immunocompromised (Organ Transplant, cancer chemotherapy, Rheumatoid biologic meds such as Humera)  3. Poorly controlled diabetes (A1C &gt; 8.0, blood glucose over 200)  If you have one of these conditions, contact your surgeon for an antibiotic prescription,  prior to your dental procedure.    Pick up stool softner and laxative for home use following surgery while on pain medications. Do not submerge incision under water. Please use good hand washing techniques while changing dressing each day. May shower starting three days after surgery. Please use a clean towel to pat the incision dry following showers. Continue to use ice for pain and swelling after surgery. Do not use any lotions or creams on the incision until instructed by your surgeon.  

## 2021-11-06 NOTE — Progress Notes (Signed)
Assisted Dr. Turk with right, ultrasound guided, adductor canal block. Side rails up, monitors on throughout procedure. See vital signs in flow sheet. Tolerated Procedure well.  

## 2021-11-06 NOTE — Interval H&P Note (Signed)
History and Physical Interval Note:  11/06/2021 10:37 AM  Cassandra [name]  has presented today for surgery, with the diagnosis of right knee osetoarthritis.  The various methods of treatment have been discussed with the patient and family. After consideration of risks, benefits and other options for treatment, the patient has consented to  Procedure(s): TOTAL KNEE ARTHROPLASTY (Right) as a surgical intervention.  The patient's history has been reviewed, patient examined, no change in status, stable for surgery.  I have reviewed the patient's chart and labs.  Questions were answered to the patient's satisfaction.     Pilar Plate Kenidy Crossland

## 2021-11-06 NOTE — Anesthesia Procedure Notes (Signed)
Anesthesia Regional Block: Adductor canal block   Pre-Anesthetic Checklist: , timeout performed,  Correct Patient, Correct Site, Correct Laterality,  Correct Procedure, Correct Position, site marked,  Risks and benefits discussed,  Surgical consent,  Pre-op evaluation,  At surgeon's request and post-op pain management  Laterality: Right  Prep: chloraprep       Needles:  Injection technique: Single-shot  Needle Type: Echogenic Needle     Needle Length: 9cm  Needle Gauge: 21     Additional Needles:   Procedures:,,,, ultrasound used (permanent image in chart),,    Narrative:  Start time: 11/06/2021 11:54 AM End time: 11/06/2021 12:02 PM Injection made incrementally with aspirations every 5 mL.  Performed by: Personally  Anesthesiologist: Santa Lighter, MD  Additional Notes: No pain on injection. No increased resistance to injection. Injection made in 5cc increments.  Good needle visualization.  Patient tolerated procedure well.

## 2021-11-06 NOTE — Evaluation (Signed)
Physical Therapy Evaluation Patient Details Name: Cassandra [name] MRN: [id number] DOB: [birth year] Today's Date: 11/06/2021  History of Present Illness  76 yo female s/p R TKA. PMH: L TKA, R breast CA, obesity, HTN, PE  Clinical Impression  Pt is s/p TKA resulting in the deficits listed below (see PT Problem List).  Pt amb ~ 89' with RW, distance limited by pain. Will continue to follow and anticipate steady progress in acute setting  Pt will benefit from skilled PT to increase their independence and safety with mobility to allow discharge to the venue listed below.         Recommendations for follow up therapy are one component of a multi-disciplinary discharge planning process, led by the attending physician.  Recommendations may be updated based on patient status, additional functional criteria and insurance authorization.  Follow Up Recommendations Follow physician's recommendations for discharge plan and follow up therapies    Assistance Recommended at Discharge Intermittent Supervision/Assistance  Patient can return home with the following  Help with stairs or ramp for entrance;Assist for transportation    Equipment Recommendations None recommended by PT  Recommendations for Other Services       Functional Status Assessment Patient has had a recent decline in their functional status and demonstrates the ability to make significant improvements in function in a reasonable and predictable amount of time.     Precautions / Restrictions Precautions Precautions: Fall;Knee Required Braces or Orthoses: Knee Immobilizer - Right Knee Immobilizer - Right: Discontinue once straight leg raise with < 10 degree lag Restrictions Weight Bearing Restrictions: No RLE Weight Bearing: Weight bearing as tolerated      Mobility  Bed Mobility Overal bed mobility: Needs Assistance Bed Mobility: Supine to Sit     Supine to sit: Min assist     General bed mobility comments: assist with  RLE, incr time    Transfers Overall transfer level: Needs assistance Equipment used: Rolling walker (2 wheels) Transfers: Sit to/from Stand Sit to Stand: Min assist           General transfer comment: cues for hand placement and LE positoning    Ambulation/Gait Ambulation/Gait assistance: Min guard, Min assist Gait Distance (Feet): 50 Feet Assistive device: Rolling walker (2 wheels) Gait Pattern/deviations: Step-to pattern, Decreased stance time - right       General Gait Details: cues for sequence and distance from [employer] Mobility    Modified Rankin (Stroke Patients Only)       Balance Overall balance assessment: Mild deficits observed, not formally tested                                           Pertinent Vitals/Pain Pain Assessment Pain Assessment: Faces Faces Pain Scale: Hurts little more Pain Location: right knee Pain Descriptors / Indicators: Sore Pain Intervention(s): Limited activity within patient's tolerance, Monitored during session, Premedicated before session, Repositioned, Ice applied    Home Living Family/patient expects to be discharged to:: Private residence Living Arrangements: Alone Available Help at Discharge: Friend(s);Available 24 hours/day Type of Home: House Home Access: Stairs to enter Entrance Stairs-Rails: Right;Left;Can reach both   Alternate Level Stairs-Number of Steps: 4 Home Layout: One level Home Equipment: Shower seat;Grab bars - tub/shower;Cane - single Barista (2 wheels)      Prior Function Prior Level  of Function : Independent/Modified Independent                     Hand Dominance        Extremity/Trunk Assessment   Upper Extremity Assessment Upper Extremity Assessment: Overall WFL for tasks assessed    Lower Extremity Assessment Lower Extremity Assessment: RLE deficits/detail RLE Deficits / Details: ankle WFL, knee extension and hip  flexion 2+/5, SLR with minimal quad lag       Communication   Communication: No difficulties  Cognition Arousal/Alertness: Awake/alert Behavior During Therapy: WFL for tasks assessed/performed Overall Cognitive Status: Within Functional Limits for tasks assessed                                          General Comments      Exercises Total Joint Exercises Ankle Circles/Pumps: AROM, Both, 10 reps Quad Sets: 5 reps, Right, AROM   Assessment/Plan    PT Assessment Patient needs continued PT services  PT Problem List Decreased strength;Decreased mobility;Decreased range of motion;Decreased activity tolerance;Decreased knowledge of use of DME;Obesity       PT Treatment Interventions DME instruction;Therapeutic exercise;Gait training;Functional mobility training;Therapeutic activities;Patient/family education;Stair training    PT Goals (Current goals can be found in the Care Plan section)  Acute Rehab PT Goals Patient Stated Goal: less knee pain PT Goal Formulation: With patient Time For Goal Achievement: 11/13/21 Potential to Achieve Goals: Good    Frequency 7X/week     Co-evaluation               AM-PAC PT "6 Clicks" Mobility  Outcome Measure Help needed turning from your back to your side while in a flat bed without using bedrails?: A Little Help needed moving from lying on your back to sitting on the side of a flat bed without using bedrails?: A Little Help needed moving to and from a bed to a chair (including a wheelchair)?: A Little Help needed standing up from a chair using your arms (e.g., wheelchair or bedside chair)?: A Little Help needed to walk in hospital room?: A Little Help needed climbing 3-5 steps with a railing? : A Lot 6 Click Score: 17    End of Session Equipment Utilized During Treatment: Gait belt;Right knee immobilizer Activity Tolerance: Patient tolerated treatment well Patient left: in chair;with call bell/phone within  reach;with chair alarm set;with family/visitor present   PT Visit Diagnosis: Other abnormalities of gait and mobility (R26.89);Difficulty in walking, not elsewhere classified (R26.2)    Time: 1224-8250 PT Time Calculation (min) (ACUTE ONLY): 18 min   Charges:   PT Evaluation $PT Eval Low Complexity: [place], PT  Acute Rehab Dept ([place]) [phone] Pager [phone]  11/06/2021   [facility] 11/06/2021, 6:41 PM

## 2021-11-06 NOTE — Anesthesia Procedure Notes (Addendum)
Spinal  Patient location during procedure: OR Start time: 11/06/2021 12:49 PM Reason for block: surgical anesthesia Staffing Performed: resident/CRNA  Anesthesiologist: Santa Lighter, MD Resident/CRNA: Gerald Leitz, CRNA Preanesthetic Checklist Completed: patient identified, IV checked, site marked, risks and benefits discussed, surgical consent, monitors and equipment checked, pre-op evaluation and timeout performed Spinal Block Patient position: sitting Prep: Betadine Patient monitoring: heart rate, continuous pulse ox, blood pressure and cardiac monitor Approach: midline Location: L3-4 Injection technique: single-shot Needle Needle type: Whitacre and Introducer  Needle gauge: 25 G Needle length: 12.7 cm Assessment Sensory level: T4 Events: CSF return Additional Notes Negative paresthesia. Negative blood return. Positive free-flowing CSF. Expiration date of kit checked and confirmed. Patient tolerated procedure well, without complications.

## 2021-11-06 NOTE — Plan of Care (Signed)

## 2021-11-06 NOTE — Anesthesia Preprocedure Evaluation (Addendum)
Anesthesia Evaluation  Patient identified by MRN, date of birth, ID band Patient awake    Reviewed: Allergy & Precautions, NPO status , Patient's Chart, lab work & pertinent test results, reviewed documented beta blocker date and time   Airway Mallampati: II  TM Distance: >3 FB Neck ROM: Full    Dental  (+) Teeth Intact, Dental Advisory Given, Caps   Pulmonary shortness of breath, sleep apnea and Continuous Positive Airway Pressure Ventilation , PE (2020)   Pulmonary exam normal breath sounds clear to auscultation       Cardiovascular hypertension, Pt. on home beta blockers and Pt. on medications Normal cardiovascular exam Rhythm:Regular Rate:Normal     Neuro/Psych negative neurological ROS  negative psych ROS   GI/Hepatic negative GI ROS, Neg liver ROS,   Endo/Other  Hypothyroidism Obesity   Renal/GU negative Renal ROS     Musculoskeletal  (+) Arthritis ,   Abdominal   Peds  Hematology  (+) Blood dyscrasia (eliquis), , Plt 343k   Anesthesia Other Findings Day of surgery medications reviewed with the patient.  Reproductive/Obstetrics                            Anesthesia Physical Anesthesia Plan  ASA: 3  Anesthesia Plan: Spinal   Post-op Pain Management: Regional block   Induction: Intravenous  PONV Risk Score and Plan: 2 and TIVA, Treatment may vary due to age or medical condition, Dexamethasone and Ondansetron  Airway Management Planned: Natural Airway and Nasal Cannula  Additional Equipment:   Intra-op Plan:   Post-operative Plan:   Informed Consent: I have reviewed the patients History and Physical, chart, labs and discussed the procedure including the risks, benefits and alternatives for the proposed anesthesia with the patient or authorized representative who has indicated his/her understanding and acceptance.     Dental advisory given  Plan Discussed with:  CRNA  Anesthesia Plan Comments:         Anesthesia Quick Evaluation

## 2021-11-06 NOTE — Care Plan (Signed)
Ortho Bundle Case Management Note  Patient Details  Name: Cassandra [name] MRN: [id number] Date of Birth: 03/29/1946                  R TKA on 11-06-21 DCP: Home with neighbor. DME: No needs, has a RW and 3-in-1 PT: [place]. Referral sent for [facility] 11-09-21.   DME Arranged:  N/A DME Agency:     HH Arranged:    HH Agency:     Additional Comments: Please contact me with any questions of if this plan should need to change.  Marianne Sofia, RN,CCM [facility]  [phone] 11/06/2021, 10:57 AM

## 2021-11-07 ENCOUNTER — Encounter (HOSPITAL_COMMUNITY): Payer: Self-pay | Admitting: Orthopedic Surgery

## 2021-11-07 DIAGNOSIS — Z7901 Long term (current) use of anticoagulants: Secondary | ICD-10-CM | POA: Diagnosis not present

## 2021-11-07 DIAGNOSIS — E039 Hypothyroidism, unspecified: Secondary | ICD-10-CM | POA: Diagnosis not present

## 2021-11-07 DIAGNOSIS — Z96652 Presence of left artificial knee joint: Secondary | ICD-10-CM | POA: Diagnosis not present

## 2021-11-07 DIAGNOSIS — Z853 Personal history of malignant neoplasm of breast: Secondary | ICD-10-CM | POA: Diagnosis not present

## 2021-11-07 DIAGNOSIS — I10 Essential (primary) hypertension: Secondary | ICD-10-CM | POA: Diagnosis not present

## 2021-11-07 DIAGNOSIS — Z20822 Contact with and (suspected) exposure to covid-19: Secondary | ICD-10-CM | POA: Diagnosis not present

## 2021-11-07 DIAGNOSIS — M1711 Unilateral primary osteoarthritis, right knee: Secondary | ICD-10-CM | POA: Diagnosis not present

## 2021-11-07 DIAGNOSIS — Z79899 Other long term (current) drug therapy: Secondary | ICD-10-CM | POA: Diagnosis not present

## 2021-11-07 LAB — CBC
HCT: 37.6 % (ref 36.0–46.0)
Hemoglobin: 12.3 g/dL (ref 12.0–15.0)
MCH: 31.6 pg (ref 26.0–34.0)
MCHC: 32.7 g/dL (ref 30.0–36.0)
MCV: 96.7 fL (ref 80.0–100.0)
Platelets: 319 10*3/uL (ref 150–400)
RBC: 3.89 MIL/uL (ref 3.87–5.11)
RDW: 14.4 % (ref 11.5–15.5)
WBC: 13.3 10*3/uL — ABNORMAL HIGH (ref 4.0–10.5)
nRBC: 0 % (ref 0.0–0.2)

## 2021-11-07 LAB — BASIC METABOLIC PANEL
Anion gap: 9 (ref 5–15)
BUN: 18 mg/dL (ref 8–23)
CO2: 22 mmol/L (ref 22–32)
Calcium: 8.8 mg/dL — ABNORMAL LOW (ref 8.9–10.3)
Chloride: 104 mmol/L (ref 98–111)
Creatinine, Ser: 0.94 mg/dL (ref 0.44–1.00)
GFR, Estimated: >60 mL/min (ref >60)
Glucose, Bld: 151 mg/dL — ABNORMAL HIGH (ref 70–99)
Potassium: 3.9 mmol/L (ref 3.5–5.1)
Sodium: 135 mmol/L (ref 135–145)

## 2021-11-07 MED ORDER — GABAPENTIN 300 MG PO CAPS: ORAL_CAPSULE | ORAL | 0 refills | Status: AC

## 2021-11-07 MED ORDER — METHOCARBAMOL 500 MG PO TABS: 500.0000 mg | ORAL_TABLET | Freq: Four times a day (QID) | ORAL | 0 refills | Status: DC | PRN

## 2021-11-07 MED ORDER — TRAMADOL HCL 50 MG PO TABS: 50.0000 mg | ORAL_TABLET | Freq: Four times a day (QID) | ORAL | 0 refills | Status: DC | PRN

## 2021-11-07 MED ORDER — OXYCODONE HCL 5 MG PO TABS: 5.0000 mg | ORAL_TABLET | Freq: Four times a day (QID) | ORAL | 0 refills | Status: DC | PRN

## 2021-11-07 NOTE — TOC Transition Note (Signed)
Transition of Care (TOC) - CM/SW Discharge Note ° ° °Patient Details  °Name: Cassandra [name] °MRN: [id number] °Date of Birth: 10/10/1945 ° °Transition of Care (TOC) CM/SW Contact:  °HOYLE, LUCY, LCSW °Phone Number: °11/07/2021, 10:38 AM ° ° °Clinical Narrative:    ° °Met with patient and confirming she has all needed DME at home.  Plan for OPPT at [facility].  No TOC needs. ° °Final next level of care: OP Rehab °Barriers to Discharge: No Barriers Identified ° ° °Patient Goals and CMS Choice °Patient states their goals for this hospitalization and ongoing recovery are:: return home °  °  ° °Discharge Placement °  °           °  °  °  °  ° °Discharge Plan and Services °  °  °           °DME Arranged: N/A °  °  °  °  °  °  °  °  °  ° °Social Determinants of Health (SDOH) Interventions °  ° ° °Readmission Risk Interventions °No flowsheet data found. ° ° ° ° °

## 2021-11-07 NOTE — Progress Notes (Signed)
Subjective: 1 Day Post-Op Procedure(s) (LRB): TOTAL KNEE ARTHROPLASTY (Right) Patient reports pain as mild.   Patient seen in rounds by Dr. Wynelle Link. Patient is well, and has had no acute complaints or problems. Denies SOB, chest pain, or calf pain. No acute overnight events. Ambulated 50 feet with therapy yesterday. Will continue therapy today. Foley pulled this am.   Objective: Vital signs in last 24 hours: Temp:  [97.5 F (36.4 C)-98.6 F (37 C)] 97.7 F (36.5 C) (02/14 0552) Pulse Rate:  [58-77] 65 (02/14 0552) Resp:  [13-21] 17 (02/14 0552) BP: (102-145)/(55-82) 115/61 (02/14 0552) SpO2:  [92 %-99 %] 93 % (02/14 0552) Weight:  [90.7 kg] 90.7 kg (02/13 1626)  Intake/Output from previous day:  Intake/Output Summary (Last 24 hours) at 11/07/2021 0713 Last data filed at 11/07/2021 0612 Gross per 24 hour  Intake 2850.15 ml  Output 3550 ml  Net -699.85 ml     Intake/Output this shift: No intake/output data recorded.  Labs: Recent Labs    11/07/21 0342  HGB 12.3   Recent Labs    11/07/21 0342  WBC 13.3*  RBC 3.89  HCT 37.6  PLT 319   Recent Labs    11/07/21 0342  NA 135  K 3.9  CL 104  CO2 22  BUN 18  CREATININE 0.94  GLUCOSE 151*  CALCIUM 8.8*   No results for input(s): LABPT, INR in the last 72 hours.  Exam: General - Patient is Alert and Oriented Extremity - Neurologically intact Neurovascular intact Dorsiflexion/Plantar flexion intact Dressing - dressing C/D/I Motor Function - intact, moving foot and toes well on exam.   Past Medical History:  Diagnosis Date   Arthritis    Cancer ([place])    Breast cancer right '06- surgery, chemo, radiation- released after 5 yrs   Dyspnea    in 2020 had pe placed on eliquis   Hx of seasonal allergies    OTC med used   Hypertension    Hypothyroidism    Pulmonary embolism ([place])    hx of in 2020 placed on eliquis   Sleep apnea    cpap    Assessment/Plan: 1 Day Post-Op Procedure(s) (LRB): TOTAL KNEE  ARTHROPLASTY (Right) Principal Problem:   Primary osteoarthritis of left knee  Estimated body mass index is 39.06 kg/m as calculated from the following:   Height as of this encounter: 5' (1.524 m).   Weight as of this encounter: 90.7 kg. Up with therapy   Patient's anticipated LOS is less than 2 midnights, meeting these requirements: - Younger than 22 - Lives within 1 hour of care - Has a competent adult at home to recover with post-op recover - NO history of  - Chronic pain requiring opiods  - Diabetes  - Coronary Artery Disease  - Heart failure  - Heart attack  - Stroke  - DVT/VTE  - Cardiac arrhythmia  - Respiratory Failure/COPD  - Renal failure  - Anemia  - Advanced Liver disease     DVT Prophylaxis - TED hose and Eliquis Weight bearing as tolerated. Continue therapy.  Plan is to go Home after hospital stay.  Plan for one session with PT this morning, and if meeting goals, will plan for discharge this afternoon.   Patient to follow up in two weeks with Dr. Wynelle Link in clinic.   The PDMP database was reviewed today prior to any opioid medications being prescribed to this patient.Fenton Foy, MBA, PA-C Orthopedic Surgery 11/07/2021, 7:13 AM

## 2021-11-07 NOTE — Plan of Care (Signed)

## 2021-11-07 NOTE — Progress Notes (Signed)
Physical Therapy Treatment Patient Details Name: Cassandra [name] MRN: [id number] DOB: 08-08-1946 Today's Date: 11/07/2021   History of Present Illness 76 yo female s/p R TKA. PMH: L TKA, R breast CA, obesity, HTN, PE    PT Comments    Pt is progressing toward PT goals. Will see again for stair practice and pt should be ready for d/c this pm.    Recommendations for follow up therapy are one component of a multi-disciplinary discharge planning process, led by the attending physician.  Recommendations may be updated based on patient status, additional functional criteria and insurance authorization.  Follow Up Recommendations  Follow physician's recommendations for discharge plan and follow up therapies     Assistance Recommended at Discharge Intermittent Supervision/Assistance  Patient can return home with the following Help with stairs or ramp for entrance;Assist for transportation   Equipment Recommendations  None recommended by PT    Recommendations for Other Services       Precautions / Restrictions Precautions Precautions: Fall;Knee Precaution Comments: independent SLRs Required Braces or Orthoses: Knee Immobilizer - Right Knee Immobilizer - Right: Discontinue once straight leg raise with < 10 degree lag Restrictions Weight Bearing Restrictions: Yes RLE Weight Bearing: Weight bearing as tolerated     Mobility  Bed Mobility   Bed Mobility: Supine to Sit, Sit to Supine     Supine to sit: Supervision Sit to supine: Supervision   General bed mobility comments: for safety, no physical assist    Transfers Overall transfer level: Needs assistance Equipment used: Rolling walker (2 wheels) Transfers: Sit to/from Stand Sit to Stand: Supervision, Min guard           General transfer comment: cues for hand placement and LE positoning    Ambulation/Gait Ambulation/Gait assistance: Supervision, Min guard Gait Distance (Feet): 90 Feet (15' more) Assistive device:  Rolling walker (2 wheels) Gait Pattern/deviations: Step-to pattern, Decreased stance time - right       General Gait Details: cues for sequence and distance from RW. improving wt shift to RLE   Stairs             Wheelchair Mobility    Modified Rankin (Stroke Patients Only)       Balance                                            Cognition Arousal/Alertness: Awake/alert Behavior During Therapy: WFL for tasks assessed/performed Overall Cognitive Status: Within Functional Limits for tasks assessed                                          Exercises Total Joint Exercises Ankle Circles/Pumps: AROM, Both, 10 reps Quad Sets: AROM, Both, 10 reps Heel Slides: AROM, AAROM, Right, 10 reps Straight Leg Raises: AROM, Right, 10 reps    General Comments        Pertinent Vitals/Pain Pain Assessment Pain Assessment: Faces Faces Pain Scale: Hurts a little bit Pain Location: right knee Pain Descriptors / Indicators: Sore Pain Intervention(s): Limited activity within patient's tolerance, Monitored during session, Premedicated before session, Ice applied    Home Living                          Prior Function  PT Goals (current goals can now be found in the care plan section) Acute Rehab PT Goals Patient Stated Goal: less knee pain PT Goal Formulation: With patient Time For Goal Achievement: 11/13/21 Potential to Achieve Goals: Good Progress towards PT goals: Progressing toward goals    Frequency    7X/week      PT Plan Current plan remains appropriate    Co-evaluation              AM-PAC PT "6 Clicks" Mobility   Outcome Measure  Help needed turning from your back to your side while in a flat bed without using bedrails?: A Little Help needed moving from lying on your back to sitting on the side of a flat bed without using bedrails?: A Little Help needed moving to and from a bed to a chair  (including a wheelchair)?: A Little Help needed standing up from a chair using your arms (e.g., wheelchair or bedside chair)?: A Little Help needed to walk in hospital room?: A Little Help needed climbing 3-5 steps with a railing? : A Little 6 Click Score: 18    End of Session Equipment Utilized During Treatment: Gait belt Activity Tolerance: Patient tolerated treatment well Patient left: with call bell/phone within reach;in bed;with bed alarm set Nurse Communication: Mobility status PT Visit Diagnosis: Other abnormalities of gait and mobility (R26.89);Difficulty in walking, not elsewhere classified (R26.2)     Time: 1203-1228 PT Time Calculation (min) (ACUTE ONLY): 25 min  Charges:  $Gait Training: 8-22 mins $Therapeutic Exercise: 8-22 mins                     Baxter Flattery, PT  Acute Rehab Dept ([place]) [phone] Pager [phone]  11/07/2021    [facility] 11/07/2021, 12:42 PM

## 2021-11-07 NOTE — Progress Notes (Signed)
11/07/21 1512  PT Visit Information  Last PT Received On 11/07/21  Pt progressing well. Meeting goals, ready to d/c with family assist as needed from PT standpoint.   Assistance Needed +1  History of Present Illness 76 yo female s/p R TKA. PMH: L TKA, R breast CA, obesity, HTN, PE  Subjective Data  Patient Stated Goal less knee pain  Precautions  Precautions Fall;Knee  Precaution Comments independent SLRs  Required Braces or Orthoses Knee Immobilizer - Right  Knee Immobilizer - Right Discontinue once straight leg raise with < 10 degree lag  Restrictions  RLE Weight Bearing WBAT  Pain Assessment  Pain Assessment Faces  Pain Location right knee  Pain Descriptors / Indicators Sore  Pain Intervention(s) Limited activity within patient's tolerance;Monitored during session;Premedicated before session  Cognition  Arousal/Alertness Awake/alert  Behavior During Therapy WFL for tasks assessed/performed  Overall Cognitive Status Within Functional Limits for tasks assessed  Bed Mobility  Bed Mobility Supine to Sit;Sit to Supine  Supine to sit Supervision;Modified independent (Device/Increase time)  Sit to supine Supervision;Modified independent (Device/Increase time)  General bed mobility comments no physical assist  Transfers  Overall transfer level Needs assistance  Equipment used Rolling walker (2 wheels)  Transfers Sit to/from Stand  Sit to Stand Supervision;Min guard  General transfer comment cues for hand placement and LE positoning  Ambulation/Gait  Ambulation/Gait assistance Supervision;Min guard  Gait Distance (Feet) 50 Feet  Assistive device Rolling walker (2 wheels)  Gait Pattern/deviations Step-to pattern;Decreased stance time - right  General Gait Details cues for sequence and distance from RW. improving wt shift to RLE  Stairs Yes  Stairs assistance Min guard  Stair Management Step to pattern;Two rails;Forwards  Number of Stairs 5 (x2)  General stair comments cues  for sequence on 1st trial, demonstrates correct technique and good carryover on second trial. no LOB, no knee buckling  PT - End of Session  Equipment Utilized During Treatment Gait belt  Activity Tolerance Patient tolerated treatment well  Patient left with call bell/phone within reach;in bed;with bed alarm set  Nurse Communication Mobility status   PT - Assessment/Plan  PT Plan Current plan remains appropriate  PT Visit Diagnosis Other abnormalities of gait and mobility (R26.89);Difficulty in walking, not elsewhere classified (R26.2)  PT Frequency (ACUTE ONLY) 7X/week  Follow Up Recommendations Follow physician's recommendations for discharge plan and follow up therapies  Assistance recommended at discharge Intermittent Supervision/Assistance  Patient can return home with the following Help with stairs or ramp for entrance;Assist for transportation  PT equipment None recommended by PT  AM-PAC PT "6 Clicks" Mobility Outcome Measure (Version 2)  Help needed turning from your back to your side while in a flat bed without using bedrails? 3  Help needed moving from lying on your back to sitting on the side of a flat bed without using bedrails? 3  Help needed moving to and from a bed to a chair (including a wheelchair)? 3  Help needed standing up from a chair using your arms (e.g., wheelchair or bedside chair)? 3  Help needed to walk in hospital room? 3  Help needed climbing 3-5 steps with a railing?  3  6 Click Score 18  Consider Recommendation of Discharge To: Home with [facility]  Progressive Mobility  What is the highest level of mobility based on the progressive mobility assessment? Level 5 (Walks with assist in room/hall) - Balance while stepping forward/back and can walk in room with assist - Complete  Activity Ambulated with assistance  in hallway  PT Goal Progression  Progress towards PT goals Progressing toward goals  Acute Rehab PT Goals  PT Goal Formulation With patient  Time For Goal  Achievement 11/13/21  Potential to Achieve Goals Good  PT Time Calculation  PT Start Time (ACUTE ONLY) 1348  PT Stop Time (ACUTE ONLY) 1405  PT Time Calculation (min) (ACUTE ONLY) 17 min  PT General Charges  $$ ACUTE PT VISIT 1 Visit  PT Treatments  $Gait Training 8-22 mins

## 2021-11-07 NOTE — Progress Notes (Signed)
Provided discharge education/instructions, all questions and concerns addressed. Pt not in acute distress, discharged home with belongings accompanied by friend. 

## 2021-11-09 ENCOUNTER — Encounter: Payer: Self-pay | Admitting: Physical Therapy

## 2021-11-09 ENCOUNTER — Ambulatory Visit: Payer: Medicare HMO | Attending: Orthopedic Surgery | Admitting: Physical Therapy

## 2021-11-09 ENCOUNTER — Other Ambulatory Visit: Payer: Self-pay

## 2021-11-09 DIAGNOSIS — R262 Difficulty in walking, not elsewhere classified: Secondary | ICD-10-CM | POA: Diagnosis not present

## 2021-11-09 DIAGNOSIS — R600 Localized edema: Secondary | ICD-10-CM | POA: Diagnosis not present

## 2021-11-09 DIAGNOSIS — M25661 Stiffness of right knee, not elsewhere classified: Secondary | ICD-10-CM | POA: Diagnosis not present

## 2021-11-09 DIAGNOSIS — M25561 Pain in right knee: Secondary | ICD-10-CM | POA: Insufficient documentation

## 2021-11-09 NOTE — Therapy (Signed)
[place]. [place], [place], [address] Phone: [phone]   Fax:  [phone]  Physical Therapy Evaluation  Patient Details  Name: Cassandra [name] MRN: [id number] Date of Birth: [birth year] Referring Provider (PT): Aluisio   Encounter Date: 11/09/2021   PT End of Session - 11/09/21 1557     Visit Number 1    Number of Visits 19    Date for PT Re-Evaluation 12/21/21    Authorization Type [employer]    Authorization Time Period 11/09/21 to 12/21/21    Progress Note Due on Visit 10    PT Start Time 1448    PT Stop Time 1528    PT Time Calculation (min) 40 min    Activity Tolerance Patient tolerated treatment well    Behavior During Therapy [facility] for tasks assessed/performed             Past Medical History:  Diagnosis Date   Arthritis    Cancer ([place])    Breast cancer right '06- surgery, chemo, radiation- released after 5 yrs   Dyspnea    in 2020 had pe placed on eliquis   Hx of seasonal allergies    OTC med used   Hypertension    Hypothyroidism    Pulmonary embolism ([place])    hx of in 2020 placed on eliquis   Sleep apnea    cpap    Past Surgical History:  Procedure Laterality Date   BREAST SURGERY Right    modified partial mastectomy/ lymph nodes dissection   COLON SURGERY  2/17   INCISIONAL HERNIA REPAIR N/A 04/06/2016   Procedure: LAPAROSCOPIC ASSISTED INCISIONAL HERNIA REPAIR WITH MESH;  Surgeon: Leighton Ruff, MD;  Location: [facility];  Service: General;  Laterality: N/A;   TONSILLECTOMY     TOTAL KNEE ARTHROPLASTY Left 12/19/2020   Procedure: TOTAL KNEE ARTHROPLASTY;  Surgeon: Gaynelle Arabian, MD;  Location: [facility];  Service: Orthopedics;  Laterality: Left;  17min   TOTAL KNEE ARTHROPLASTY Right 11/06/2021   Procedure: TOTAL KNEE ARTHROPLASTY;  Surgeon: Gaynelle Arabian, MD;  Location: [facility];  Service: Orthopedics;  Laterality: Right;    There were no vitals filed for this visit.    Subjective  Assessment - 11/09/21 1451     Subjective I had my R knee replaced by Dr. Wynelle Link on Monday. Haven't been home yet, staying with a friend who is helping me. I haven't been moving much, I was able to find some pain neutral positions and was able to sleep better last night. I'd like some padding to help protect my hands from pressure on the walker. Its only been 2 days since surgery, I'm still having a lot of pain with transitions and transfers.    Patient Stated Goals get moving better, get pain down    Currently in Pain? Yes    Pain Score 2    can get as high as 6/10   Pain Location Knee    Pain Orientation Right    Pain Descriptors / Indicators Stabbing;Burning    Pain Type Surgical pain    Pain Radiating Towards none    Pain Onset 1 to 4 weeks ago    Pain Frequency Constant    Aggravating Factors  transitions, trying to move, taking first step, etc    Pain Relieving Factors pain meds, finding certain positions    Effect of Pain on Daily Activities severe  [facility] PT Assessment - 11/09/21 0001       Assessment   Medical Diagnosis s/p R TKR    Referring Provider (PT) Aluisio    Onset Date/Surgical Date 11/06/21    Next MD Visit Aluisio in 2.5 weeks    Prior Therapy PT for other knee, also in hospital      Precautions   Precautions None      Restrictions   Weight Bearing Restrictions No    Other Position/Activity Restrictions WBAT      Balance Screen   Has the patient fallen in the past 6 months No    Has the patient had a decrease in activity level because of a fear of falling?  Yes    Is the patient reluctant to leave their home because of a fear of falling?  No      Home Ecologist residence      Prior Function   Level of Independence Independent;Independent with basic ADLs    Vocation Retired    Leisure anything outside, reading, watching TV      ROM / Strength   AROM / PROM / Strength AROM;Strength;PROM      AROM    AROM Assessment Site Knee    Right/Left Knee Right    Right Knee Extension 16    Right Knee Flexion 77      PROM   PROM Assessment Site Knee    Right/Left Knee Right    Right Knee Extension 13    Right Knee Flexion 83      Strength   Strength Assessment Site Hip;Ankle;Knee    Right/Left Hip Left;Right    Right Hip Flexion 3/5    Right Hip ABduction 4-/5   seated hip ABD   Left Hip Flexion 4/5    Left Hip ABduction 4-/5   seated hip ABD   Right/Left Knee Right;Left    Right Knee Flexion 3-/5    Right Knee Extension 3/5    Left Knee Flexion 5/5    Left Knee Extension 5/5    Right/Left Ankle Right;Left    Right Ankle Dorsiflexion 4+/5    Left Ankle Dorsiflexion 4/5      Ambulation/Gait   Gait Comments antalgic gait with limited R knee ROM, limited heel toe pattern, flexed hips and tends to push RW too far ahead of herself; step to pattern      High Level Balance   High Level Balance Comments TUG 47 seconds with RW                        Objective measurements completed on examination: See above findings.                PT Education - 11/09/21 1557     Education Details exam findings, POC, extensive education on safe RW use for gait and transfers, continue with hospital based HEP for now, genreal safety precautions after TKR    Person(s) Educated Patient    Methods Explanation    Comprehension Verbalized understanding              PT Short Term Goals - 11/09/21 1600       PT SHORT TERM GOAL #1   Title Will be independent iwth appropriate progressive HEP    Time 3    Period Weeks    Status New    Target Date 11/30/21      PT SHORT TERM GOAL #  2   Title R knee ROM to be no more than 8 degrees extension and will be at least 100 degrees flexion    Time 3    Period Weeks    Status New      PT SHORT TERM GOAL #3   Title Gait pattern will improve to consistent heel-toe, step through pattern with upright posture, even step and stride  lengths, and minimal unsteadiness with LRAD    Time 3    Period Weeks    Status New      PT SHORT TERM GOAL #4   Title Will be independent with edema management strategies    Time 3    Period Weeks    Status New               PT Long Term Goals - 11/09/21 1603       PT LONG TERM GOAL #1   Title MMT to improve by at least 1 grade in all weak groups    Time 6    Period Weeks    Status New    Target Date 12/21/21      PT LONG TERM GOAL #2   Title Will demonstrate R knee extension of no more than 5 degrees, and R knee flexion of at least 110 degrees    Time 6    Period Weeks    Status New      PT LONG TERM GOAL #3   Title Will be able to ascend and descend at least 4 stairs with U rail and step over step pattern without difficulty    Time 6    Period Weeks    Status New      PT LONG TERM GOAL #4   Title Will be able to complete TUG with LRAD in 20 seconds or less to show improved balance and mobility    Time 6    Period Weeks    Status New                    Plan - 11/09/21 1558     Clinical Impression Statement Teriana arrives today after having R knee replaced on Monday, February 13. Doing well, just hasnt been keeping up with her HEP 100% just yet. Exam very typical for someone s/p TKR- will benefit from skilled PT services to address identified limitations, reduce pain, and optimize overall function/QOL.    Personal Factors and Comorbidities Fitness;Past/Current Experience;Comorbidity 1;Time since onset of injury/illness/exacerbation    Examination-Activity Limitations Locomotion Level;Transfers;Bed Mobility;Bend;Sit;Sleep;Squat;Stairs;Lift;Stand    Examination-Participation Restrictions Community Activity;Driving;Shop;Laundry;Yard Work    Merchant navy officer Stable/Uncomplicated    Designer, jewellery Low    Rehab Potential Good    PT Frequency 3x / week    PT Duration 6 weeks    PT Treatment/Interventions Cryotherapy;Moist  Heat;Vasopneumatic Device;Gait training;Electrical Stimulation;Iontophoresis 4mg /ml Dexamethasone;Therapeutic exercise;Ultrasound;Manual techniques;Neuromuscular re-education;Therapeutic activities;ADLs/Self Care Home Management    PT Next Visit Plan focus on ROM, gait training, strength, balance, edema control    PT Home Exercise Plan still on hospital HEP    Consulted and Agree with Plan of Care Patient             Patient will benefit from skilled therapeutic intervention in order to improve the following deficits and impairments:  Abnormal gait, Decreased range of motion, Difficulty walking, Obesity, Increased muscle spasms, Decreased activity tolerance, Decreased knowledge of precautions, Pain, Decreased balance, Decreased knowledge of use of DME, Decreased scar mobility, Hypomobility, Impaired  flexibility, Decreased mobility, Decreased strength, Postural dysfunction, Impaired sensation, Increased edema  Visit Diagnosis: Acute pain of right knee  Difficulty in walking, not elsewhere classified  Localized edema  Stiffness of right knee, not elsewhere classified     Problem List Patient Active Problem List   Diagnosis Date Noted   OA (osteoarthritis) of knee 12/19/2020   Primary osteoarthritis of left knee 12/19/2020   Obstructive sleep apnea 06/19/2019   Malignant neoplasm of overlapping sites of right breast in female, estrogen receptor positive ([place]) 05/14/2019   Coagulopathy ([place]) 05/14/2019   Morbid obesity ([place]) 05/14/2019   Chronic thromboembolic disease ([place]) 30/03/6225   Pulmonary vascular disease ([place]) 04/08/2019   Hypothyroidism 04/07/2019   Essential hypertension 04/07/2019   Pulmonary embolism ([place]) 04/06/2019   Abnormal liver function 04/06/2019   Hypokalemia 04/06/2019   Incisional hernia 04/06/2016   Ann Lions PT, DPT, PN2   Supplemental Physical Therapist [place]. [place], [place], [address] Phone: [phone]   Fax:  [phone]  Name: Cassandra [name] MRN: [id number] Date of Birth: [birth year]

## 2021-11-13 ENCOUNTER — Ambulatory Visit: Payer: Medicare HMO | Admitting: Physical Therapy

## 2021-11-13 ENCOUNTER — Encounter: Payer: Self-pay | Admitting: Physical Therapy

## 2021-11-13 ENCOUNTER — Other Ambulatory Visit: Payer: Self-pay

## 2021-11-13 DIAGNOSIS — R600 Localized edema: Secondary | ICD-10-CM

## 2021-11-13 DIAGNOSIS — R262 Difficulty in walking, not elsewhere classified: Secondary | ICD-10-CM

## 2021-11-13 DIAGNOSIS — M25661 Stiffness of right knee, not elsewhere classified: Secondary | ICD-10-CM

## 2021-11-13 DIAGNOSIS — M25561 Pain in right knee: Secondary | ICD-10-CM | POA: Diagnosis not present

## 2021-11-13 NOTE — Therapy (Signed)
[place]. [place], [place], [address] Phone: [phone]   Fax:  [phone]  Physical Therapy Treatment  Patient Details  Name: Cassandra [name] MRN: [id number] Date of Birth: 11-25-1945 Referring Provider (PT): Aluisio   Encounter Date: 11/13/2021   PT End of Session - 11/13/21 1446     Visit Number 2    Number of Visits 19    Date for PT Re-Evaluation 12/21/21    Authorization Type [employer]    Authorization Time Period 11/09/21 to 12/21/21    Progress Note Due on Visit 10    PT Start Time 1400    PT Stop Time 1441    PT Time Calculation (min) 41 min    Activity Tolerance Patient tolerated treatment well    Behavior During Therapy [facility] for tasks assessed/performed             Past Medical History:  Diagnosis Date   Arthritis    Cancer ([place])    Breast cancer right '06- surgery, chemo, radiation- released after 5 yrs   Dyspnea    in 2020 had pe placed on eliquis   Hx of seasonal allergies    OTC med used   Hypertension    Hypothyroidism    Pulmonary embolism ([place])    hx of in 2020 placed on eliquis   Sleep apnea    cpap    Past Surgical History:  Procedure Laterality Date   BREAST SURGERY Right    modified partial mastectomy/ lymph nodes dissection   COLON SURGERY  2/17   INCISIONAL HERNIA REPAIR N/A 04/06/2016   Procedure: LAPAROSCOPIC ASSISTED INCISIONAL HERNIA REPAIR WITH MESH;  Surgeon: Leighton Ruff, MD;  [facility]: [facility];  Service: General;  Laterality: N/A;   TONSILLECTOMY     TOTAL KNEE ARTHROPLASTY Left 12/19/2020   Procedure: TOTAL KNEE ARTHROPLASTY;  Surgeon: Gaynelle Arabian, MD;  Location: [facility];  Service: Orthopedics;  Laterality: Left;  24min   TOTAL KNEE ARTHROPLASTY Right 11/06/2021   Procedure: TOTAL KNEE ARTHROPLASTY;  Surgeon: Gaynelle Arabian, MD;  [facility];  Service: Orthopedics;  Laterality: Right;    There were no vitals filed for this visit.   Subjective  Assessment - 11/13/21 1402     Subjective I'm trying to be much better about using my walker correctly, it doesn't hurt when I use it correctly. My biggest downfall is if I find a comfortable spot that doesn't hurt when I'm sitting, I end up falling asleep and missing a pain med and it gets me off schedule    Patient Stated Goals get moving better, get pain down    Currently in Pain? Yes    Pain Score 2     Pain Location Knee    Pain Orientation Right    Pain Descriptors / Indicators Aching;Dull    Pain Type Surgical pain                               [facility] Adult PT Treatment/Exercise - 11/13/21 0001       Exercises   Exercises Knee/Hip      Knee/Hip Exercises: Supine   Quad Sets Right;1 set;15 reps    Quad Sets Limitations 3 second holds    Short Arc [employer] Right;1 set;15 reps    Short Arc Quad Sets Limitations 1 second hold    Heel Slides 10 reps    Heel Slides  Limitations with over pressure from PT    Bridges 10 reps    Straight Leg Raises Right;1 set;15 reps    Straight Leg Raises Limitations with quad set    Other Supine Knee/Hip Exercises supine knee extension holds with ice pack and 2# weight x3 min      Manual Therapy   Manual Therapy Soft tissue mobilization;Edema management    Edema Management retrograde edema massage with R LE elevated    Soft tissue mobilization IASTM to tolerance proximal r quad (away from incision)                     PT Education - 11/13/21 1445     Education Details lots of encouragemnet for HEP compliance, consistent use of ice/elevation, and good pain management strategies    Person(s) Educated Patient    Methods Explanation    Comprehension Verbalized understanding              PT Short Term Goals - 11/09/21 1600       PT SHORT TERM GOAL #1   Title Will be independent iwth appropriate progressive HEP    Time 3    Period Weeks    Status New    Target Date 11/30/21      PT SHORT TERM GOAL  #2   Title R knee ROM to be no more than 8 degrees extension and will be at least 100 degrees flexion    Time 3    Period Weeks    Status New      PT SHORT TERM GOAL #3   Title Gait pattern will improve to consistent heel-toe, step through pattern with upright posture, even step and stride lengths, and minimal unsteadiness with LRAD    Time 3    Period Weeks    Status New      PT SHORT TERM GOAL #4   Title Will be independent with edema management strategies    Time 3    Period Weeks    Status New               PT Long Term Goals - 11/09/21 1603       PT LONG TERM GOAL #1   Title MMT to improve by at least 1 grade in all weak groups    Time 6    Period Weeks    Status New    Target Date 12/21/21      PT LONG TERM GOAL #2   Title Will demonstrate R knee extension of no more than 5 degrees, and R knee flexion of at least 110 degrees    Time 6    Period Weeks    Status New      PT LONG TERM GOAL #3   Title Will be able to ascend and descend at least 4 stairs with U rail and step over step pattern without difficulty    Time 6    Period Weeks    Status New      PT LONG TERM GOAL #4   Title Will be able to complete TUG with LRAD in 20 seconds or less to show improved balance and mobility    Time 6    Period Weeks    Status New                   Plan - 11/13/21 1446     Clinical Impression Statement Tangala arrives doing well today, she is making  a big effort to use her RW properly and has had a lot less pain when walking now. Still not doing well with keeping up with her HEP, I continued to educate on importance of HEP in context of TKR. We continued with basic TKR exercises today, also worked on edema control and STM to proximal quad as tolerated. Will continue efforts, again I really encouraged her to stay on track with pain med regimen and getting more consistent with HEP on her own.    Personal Factors and Comorbidities Fitness;Past/Current  Experience;Comorbidity 1;Time since onset of injury/illness/exacerbation    Examination-Activity Limitations Locomotion Level;Transfers;Bed Mobility;Bend;Sit;Sleep;Squat;Stairs;Lift;Stand    Examination-Participation Restrictions Community Activity;Driving;Shop;Laundry;Yard Work    Merchant navy officer Stable/Uncomplicated    Designer, jewellery Low    Rehab Potential Good    PT Frequency 3x / week    PT Duration 6 weeks    PT Treatment/Interventions Cryotherapy;Moist Heat;Vasopneumatic Device;Gait training;Electrical Stimulation;Iontophoresis 4mg /ml Dexamethasone;Therapeutic exercise;Ultrasound;Manual techniques;Neuromuscular re-education;Therapeutic activities;ADLs/Self Care Home Management    PT Next Visit Plan focus on ROM, gait training, strength, balance, edema control    PT Home Exercise Plan still on hospital HEP    Consulted and Agree with Plan of Care Patient             Patient will benefit from skilled therapeutic intervention in order to improve the following deficits and impairments:  Abnormal gait, Decreased range of motion, Difficulty walking, Obesity, Increased muscle spasms, Decreased activity tolerance, Decreased knowledge of precautions, Pain, Decreased balance, Decreased knowledge of use of DME, Decreased scar mobility, Hypomobility, Impaired flexibility, Decreased mobility, Decreased strength, Postural dysfunction, Impaired sensation, Increased edema  Visit Diagnosis: Acute pain of right knee  Difficulty in walking, not elsewhere classified  Localized edema  Stiffness of right knee, not elsewhere classified     Problem List Patient Active Problem List   Diagnosis Date Noted   OA (osteoarthritis) of knee 12/19/2020   Primary osteoarthritis of left knee 12/19/2020   Obstructive sleep apnea 06/19/2019   Malignant neoplasm of overlapping sites of right breast in female, estrogen receptor positive ([place]) 05/14/2019   Coagulopathy ([place])  05/14/2019   Morbid obesity ([place]) 05/14/2019   Chronic thromboembolic disease ([place]) 20/80/2233   Pulmonary vascular disease ([place]) 04/08/2019   Hypothyroidism 04/07/2019   Essential hypertension 04/07/2019   Pulmonary embolism ([place]) 04/06/2019   Abnormal liver function 04/06/2019   Hypokalemia 04/06/2019   Incisional hernia 04/06/2016   Ann Lions PT, DPT, PN2   Supplemental Physical Therapist [place]. [place], [place], [address] Phone: [phone]   Fax:  [phone]  Name: Cassandra [name] MRN: [id number] Date of Birth: 13-Apr-1946

## 2021-11-15 ENCOUNTER — Encounter: Payer: Self-pay | Admitting: Physical Therapy

## 2021-11-15 ENCOUNTER — Other Ambulatory Visit: Payer: Self-pay

## 2021-11-15 ENCOUNTER — Ambulatory Visit: Payer: Medicare HMO | Admitting: Physical Therapy

## 2021-11-15 DIAGNOSIS — R262 Difficulty in walking, not elsewhere classified: Secondary | ICD-10-CM

## 2021-11-15 DIAGNOSIS — M25661 Stiffness of right knee, not elsewhere classified: Secondary | ICD-10-CM | POA: Diagnosis not present

## 2021-11-15 DIAGNOSIS — R600 Localized edema: Secondary | ICD-10-CM

## 2021-11-15 DIAGNOSIS — M25561 Pain in right knee: Secondary | ICD-10-CM | POA: Diagnosis not present

## 2021-11-15 NOTE — Therapy (Signed)
[place] [place], [place], [address] Phone: [phone]   Fax:  [phone]  Physical Therapy Treatment  Patient Details  Name: Cassandra [name] MRN: [id number] Date of Birth: 05-24-1946 Referring Provider (PT): Aluisio   Encounter Date: 11/15/2021   PT End of Session - 11/15/21 1444     Visit Number 3    Number of Visits 19    Date for PT Re-Evaluation 12/21/21    Authorization Type [employer]    Authorization Time Period 11/09/21 to 12/21/21    Progress Note Due on Visit 10    PT Start Time 1404    PT Stop Time 1444    PT Time Calculation (min) 40 min    Activity Tolerance Patient tolerated treatment well    Behavior During Therapy [facility] for tasks assessed/performed             Past Medical History:  Diagnosis Date   Arthritis    Cancer ([place])    Breast cancer right '06- surgery, chemo, radiation- released after 5 yrs   Dyspnea    in 2020 had pe placed on eliquis   Hx of seasonal allergies    OTC med used   Hypertension    Hypothyroidism    Pulmonary embolism ([place])    hx of in 2020 placed on eliquis   Sleep apnea    cpap    Past Surgical History:  Procedure Laterality Date   BREAST SURGERY Right    modified partial mastectomy/ lymph nodes dissection   COLON SURGERY  2/17   INCISIONAL HERNIA REPAIR N/A 04/06/2016   Procedure: LAPAROSCOPIC ASSISTED INCISIONAL HERNIA REPAIR WITH MESH;  Surgeon: Leighton Ruff, MD;  Location: [facility];  Service: General;  Laterality: N/A;   TONSILLECTOMY     TOTAL KNEE ARTHROPLASTY Left 12/19/2020   Procedure: TOTAL KNEE ARTHROPLASTY;  Surgeon: Gaynelle Arabian, MD;  Location: [facility];  Service: Orthopedics;  Laterality: Left;  44min   TOTAL KNEE ARTHROPLASTY Right 11/06/2021   Procedure: TOTAL KNEE ARTHROPLASTY;  Surgeon: Gaynelle Arabian, MD;  Location: [facility];  Service: Orthopedics;  Laterality: Right;    There were no vitals filed for this visit.   Subjective  Assessment - 11/15/21 1405     Subjective I'm feeling terrible today, I think this weather going back and forth is bothering me- the left knee has been hurting too. Really a constant toothachey feeling. I stiffen up when I sit. I've been trying to do some of the exercises in the recliner.    Patient Stated Goals get moving better, get pain down    Currently in Pain? Yes    Pain Score 4     Pain Location Knee    Pain Orientation Right    Pain Descriptors / Indicators Aching;Dull    Pain Type Surgical pain                               [facility] Adult PT Treatment/Exercise - 11/15/21 0001       Knee/Hip Exercises: Aerobic   Recumbent Bike bike seat 10 x5 minutes for knee ROM      Knee/Hip Exercises: Standing   Gait Training 65ft with RW, mod cues for upright posture, heel toe pattern, safe use of RW      Knee/Hip Exercises: Supine   Quad Sets Right;1 set;20 reps    Quad Sets Limitations 3 second holds  Short [employer] Right;1 set;20 reps    Short [employer] Limitations 3 second holds    Other Supine Knee/Hip Exercises supine knee extension holds with ice pack and 3# weight x4 min    Other Supine Knee/Hip Exercises supine knee extension overpressure      Manual Therapy   Manual Therapy Soft tissue mobilization    Soft tissue mobilization IASTM to R calf in sidelying                     PT Education - 11/15/21 1444     Education Details continued to encourage HEP and walking program compliance    Person(s) Educated Patient    Methods Explanation    Comprehension Verbalized understanding              PT Short Term Goals - 11/09/21 1600       PT SHORT TERM GOAL #1   Title Will be independent iwth appropriate progressive HEP    Time 3    Period Weeks    Status New    Target Date 11/30/21      PT SHORT TERM GOAL #2   Title R knee ROM to be no more than 8 degrees extension and will be at least 100 degrees flexion    Time 3     Period Weeks    Status New      PT SHORT TERM GOAL #3   Title Gait pattern will improve to consistent heel-toe, step through pattern with upright posture, even step and stride lengths, and minimal unsteadiness with LRAD    Time 3    Period Weeks    Status New      PT SHORT TERM GOAL #4   Title Will be independent with edema management strategies    Time 3    Period Weeks    Status New               PT Long Term Goals - 11/09/21 1603       PT LONG TERM GOAL #1   Title MMT to improve by at least 1 grade in all weak groups    Time 6    Period Weeks    Status New    Target Date 12/21/21      PT LONG TERM GOAL #2   Title Will demonstrate R knee extension of no more than 5 degrees, and R knee flexion of at least 110 degrees    Time 6    Period Weeks    Status New      PT LONG TERM GOAL #3   Title Will be able to ascend and descend at least 4 stairs with U rail and step over step pattern without difficulty    Time 6    Period Weeks    Status New      PT LONG TERM GOAL #4   Title Will be able to complete TUG with LRAD in 20 seconds or less to show improved balance and mobility    Time 6    Period Weeks    Status New                   Plan - 11/15/21 1445     Clinical Impression Statement [name] arrives a few minutes late today; has tried to do a bit better with HEP but is still not really compliant with these or walking program, provided continued education about how this  can lead to poor outcomes. Otherwise continued with basic TKR exercises, also tried some more standing and closed chain activities. We also worked on [employer] today since she keeps complaining of cramping in this muscle (on Eliquis so risk of blood clot minimal). Will continue efforts.    Personal Factors and Comorbidities Fitness;Past/Current Experience;Comorbidity 1;Time since onset of injury/illness/exacerbation    Examination-Activity Limitations Locomotion Level;Transfers;Bed  Mobility;Bend;Sit;Sleep;Squat;Stairs;Lift;Stand    Examination-Participation Restrictions Community Activity;Driving;Shop;Laundry;Yard Work    Merchant navy officer Stable/Uncomplicated    Designer, jewellery Low    Rehab Potential Good    PT Frequency 3x / week    PT Duration 6 weeks    PT Treatment/Interventions Cryotherapy;Moist Heat;Vasopneumatic Device;Gait training;Electrical Stimulation;Iontophoresis 4mg /ml Dexamethasone;Therapeutic exercise;Ultrasound;Manual techniques;Neuromuscular re-education;Therapeutic activities;ADLs/Self Care Home Management    PT Next Visit Plan focus on ROM, gait training, strength, balance, edema control    PT Home Exercise Plan still on hospital HEP    Consulted and Agree with Plan of Care Patient             Patient will benefit from skilled therapeutic intervention in order to improve the following deficits and impairments:  Abnormal gait, Decreased range of motion, Difficulty walking, Obesity, Increased muscle spasms, Decreased activity tolerance, Decreased knowledge of precautions, Pain, Decreased balance, Decreased knowledge of use of DME, Decreased scar mobility, Hypomobility, Impaired flexibility, Decreased mobility, Decreased strength, Postural dysfunction, Impaired sensation, Increased edema  Visit Diagnosis: Acute pain of right knee  Difficulty in walking, not elsewhere classified  Localized edema  Stiffness of right knee, not elsewhere classified     Problem List Patient Active Problem List   Diagnosis Date Noted   OA (osteoarthritis) of knee 12/19/2020   Primary osteoarthritis of left knee 12/19/2020   Obstructive sleep apnea 06/19/2019   Malignant neoplasm of overlapping sites of right breast in female, estrogen receptor positive ([place]) 05/14/2019   Coagulopathy ([place]) 05/14/2019   Morbid obesity ([place]) 05/14/2019   Chronic thromboembolic disease ([place]) 83/33/8329   Pulmonary vascular disease ([place]) 04/08/2019    Hypothyroidism 04/07/2019   Essential hypertension 04/07/2019   Pulmonary embolism ([place]) 04/06/2019   Abnormal liver function 04/06/2019   Hypokalemia 04/06/2019   Incisional hernia 04/06/2016   Ann Lions PT, DPT, PN2   Supplemental Physical Therapist [address]. [place], [place], [address] Phone: [phone]   Fax:  [phone]  Name: Cassandra [name] MRN: [id number] Date of Birth: Aug 02, 1946

## 2021-11-17 ENCOUNTER — Encounter: Payer: Self-pay | Admitting: Physical Therapy

## 2021-11-17 ENCOUNTER — Ambulatory Visit: Payer: Medicare HMO | Admitting: Physical Therapy

## 2021-11-17 ENCOUNTER — Other Ambulatory Visit: Payer: Self-pay

## 2021-11-17 DIAGNOSIS — M25561 Pain in right knee: Secondary | ICD-10-CM | POA: Diagnosis not present

## 2021-11-17 DIAGNOSIS — M25661 Stiffness of right knee, not elsewhere classified: Secondary | ICD-10-CM | POA: Diagnosis not present

## 2021-11-17 DIAGNOSIS — R262 Difficulty in walking, not elsewhere classified: Secondary | ICD-10-CM | POA: Diagnosis not present

## 2021-11-17 DIAGNOSIS — R600 Localized edema: Secondary | ICD-10-CM

## 2021-11-17 NOTE — Therapy (Signed)
[address]. [place], [place], [address] Phone: [phone]   Fax:  [phone]  Physical Therapy Treatment  Patient Details  Name: Cassandra [name] MRN: [id number] Date of Birth: 08/10/1946 Referring Provider (PT): Aluisio   Encounter Date: 11/17/2021   PT End of Session - 11/17/21 1145     Visit Number 4    Number of Visits 19    Date for PT Re-Evaluation 12/21/21    PT Start Time 1106    PT Stop Time 6962    PT Time Calculation (min) 42 min    Activity Tolerance Patient tolerated treatment well    Behavior During Therapy [facility] for tasks assessed/performed             Past Medical History:  Diagnosis Date   Arthritis    Cancer ([place])    Breast cancer right '06- surgery, chemo, radiation- released after 5 yrs   Dyspnea    in 2020 had pe placed on eliquis   Hx of seasonal allergies    OTC med used   Hypertension    Hypothyroidism    Pulmonary embolism ([place])    hx of in 2020 placed on eliquis   Sleep apnea    cpap    Past Surgical History:  Procedure Laterality Date   BREAST SURGERY Right    modified partial mastectomy/ lymph nodes dissection   COLON SURGERY  2/17   INCISIONAL HERNIA REPAIR N/A 04/06/2016   Procedure: LAPAROSCOPIC ASSISTED INCISIONAL HERNIA REPAIR WITH MESH;  Surgeon: Leighton Ruff, MD;  [facility];  Service: General;  Laterality: N/A;   TONSILLECTOMY     TOTAL KNEE ARTHROPLASTY Left 12/19/2020   Procedure: TOTAL KNEE ARTHROPLASTY;  Surgeon: Gaynelle Arabian, MD;  [facility];  Service: Orthopedics;  Laterality: Left;  23min   TOTAL KNEE ARTHROPLASTY Right 11/06/2021   Procedure: TOTAL KNEE ARTHROPLASTY;  Surgeon: Gaynelle Arabian, MD;  [facility];  Service: Orthopedics;  Laterality: Right;    There were no vitals filed for this visit.   Subjective Assessment - 11/17/21 1109     Subjective Patient reports she feels terrible. has a charlie horse in R calf, and could  not get comfortable last night, so she did not sleep well.    Currently in Pain? Yes    Pain Score 3     Pain Location Knee    Pain Orientation Right    Pain Descriptors / Indicators Aching;Dull    Pain Type Surgical pain    Pain Onset 1 to 4 weeks ago    Pain Frequency Constant                [facility] PT Assessment - 11/17/21 0001       PROM   Right Knee Extension 28    Right Knee Flexion 107                           [facility] Adult PT Treatment/Exercise - 11/17/21 0001       Knee/Hip Exercises: Seated   Long Arc Quad Strengthening;Right;10 reps;2 sets    Other Seated Knee/Hip Exercises Knee flexion ROM with overpressure.      Knee/Hip Exercises: Supine   Quad Sets Right;1 set;10 reps    Quad Sets Limitations 3 second holds    Short [employer] Right;Strengthening;2 sets;10 reps    Short [employer] Limitations 3 second holds  Straight Leg Raises Strengthening;Right;2 sets;10 reps      Modalities   Modalities Vasopneumatic      Vasopneumatic   Number Minutes Vasopneumatic  15 minutes    Vasopnuematic Location  Knee    Vasopneumatic Pressure Low    Vasopneumatic Temperature  34      Manual Therapy   Manual Therapy Soft tissue mobilization    Edema Management retrograde edema massage with R LE elevated                       PT Short Term Goals - 11/17/21 1121       PT SHORT TERM GOAL #1   Title Will be independent iwth appropriate progressive HEP    Baseline Patient reports inconsistent performance    Time 2    Period Weeks    Status On-going    Target Date 11/30/21      PT SHORT TERM GOAL #2   Title R knee ROM to be no more than 8 degrees extension and will be at least 100 degrees flexion    Baseline 28-107    Time 2    Period Weeks    Status On-going      PT SHORT TERM GOAL #3   Title Gait pattern will improve to consistent heel-toe, step through pattern with upright posture, even step and stride lengths, and  minimal unsteadiness with LRAD    Baseline VC for step through gait pattern.    Time 2    Period Weeks    Status On-going      PT SHORT TERM GOAL #4   Title Will be independent with edema management strategies    Baseline Educated to elevate while lying in supine. She reports that she stays in recliner, but RLE with severe edema.    Time 2    Period Weeks    Status On-going               PT Long Term Goals - 11/09/21 1603       PT LONG TERM GOAL #1   Title MMT to improve by at least 1 grade in all weak groups    Time 6    Period Weeks    Status New    Target Date 12/21/21      PT LONG TERM GOAL #2   Title Will demonstrate R knee extension of no more than 5 degrees, and R knee flexion of at least 110 degrees    Time 6    Period Weeks    Status New      PT LONG TERM GOAL #3   Title Will be able to ascend and descend at least 4 stairs with U rail and step over step pattern without difficulty    Time 6    Period Weeks    Status New      PT LONG TERM GOAL #4   Title Will be able to complete TUG with LRAD in 20 seconds or less to show improved balance and mobility    Time 6    Period Weeks    Status New                   Plan - 11/17/21 1146     Clinical Impression Statement Patient reports she is not consistently performing HEP. Reports increased pain and stiffness. Therapist educated her to the need to mobilize thoruhgout the day. Encouraged her to lie down with RLE elevated  well above heart for at least an hour each day due to severe swelling. The swelling appears to limit knee ext as well, only able ot achiev -28 degrees today. Knee flexion and strenght do appear to be progressing. continued to encourage consistent performance of HEP. Applied Vasopneumatic pump after treatment acitivites with leg in elevation to decrease swelling.    Personal Factors and Comorbidities Fitness;Past/Current Experience;Comorbidity 1;Time since onset of  injury/illness/exacerbation    Examination-Activity Limitations Locomotion Level;Transfers;Bed Mobility;Bend;Sit;Sleep;Squat;Stairs;Lift;Stand    Examination-Participation Restrictions Community Activity;Driving;Shop;Laundry;Yard Work    Merchant navy officer Stable/Uncomplicated    Designer, jewellery Low    Rehab Potential Good    PT Frequency 3x / week    PT Duration 6 weeks    PT Treatment/Interventions Cryotherapy;Moist Heat;Vasopneumatic Device;Gait training;Electrical Stimulation;Iontophoresis 4mg /ml Dexamethasone;Therapeutic exercise;Ultrasound;Manual techniques;Neuromuscular re-education;Therapeutic activities;ADLs/Self Care Home Management    PT Next Visit Plan focus on ROM, gait training, strength, balance, edema control    PT Home Exercise Plan still on hospital HEP    Consulted and Agree with Plan of Care Patient             Patient will benefit from skilled therapeutic intervention in order to improve the following deficits and impairments:  Abnormal gait, Decreased range of motion, Difficulty walking, Obesity, Increased muscle spasms, Decreased activity tolerance, Decreased knowledge of precautions, Pain, Decreased balance, Decreased knowledge of use of DME, Decreased scar mobility, Hypomobility, Impaired flexibility, Decreased mobility, Decreased strength, Postural dysfunction, Impaired sensation, Increased edema  Visit Diagnosis: Acute pain of right knee  Difficulty in walking, not elsewhere classified  Localized edema  Stiffness of right knee, not elsewhere classified     Problem List Patient Active Problem List   Diagnosis Date Noted   OA (osteoarthritis) of knee 12/19/2020   Primary osteoarthritis of left knee 12/19/2020   Obstructive sleep apnea 06/19/2019   Malignant neoplasm of overlapping sites of right breast in female, estrogen receptor positive ([place]) 05/14/2019   Coagulopathy ([place]) 05/14/2019   Morbid obesity ([place]) 05/14/2019    Chronic thromboembolic disease ([place]) 79/15/0413   Pulmonary vascular disease ([place]) 04/08/2019   Hypothyroidism 04/07/2019   Essential hypertension 04/07/2019   Pulmonary embolism ([place]) 04/06/2019   Abnormal liver function 04/06/2019   Hypokalemia 04/06/2019   Incisional hernia 04/06/2016    Marcelina Morel, DPT 11/17/2021, 11:49 AM  [address]. [place], [place], [address] Phone: [phone]   Fax:  [phone]  Name: Cassandra [name] MRN: [id number] Date of Birth: Sep 11, 1946

## 2021-11-20 ENCOUNTER — Other Ambulatory Visit: Payer: Self-pay

## 2021-11-20 ENCOUNTER — Ambulatory Visit: Payer: Medicare HMO | Admitting: Physical Therapy

## 2021-11-20 ENCOUNTER — Encounter: Payer: Self-pay | Admitting: Physical Therapy

## 2021-11-20 DIAGNOSIS — R600 Localized edema: Secondary | ICD-10-CM

## 2021-11-20 DIAGNOSIS — M25661 Stiffness of right knee, not elsewhere classified: Secondary | ICD-10-CM

## 2021-11-20 DIAGNOSIS — M25561 Pain in right knee: Secondary | ICD-10-CM | POA: Diagnosis not present

## 2021-11-20 DIAGNOSIS — R262 Difficulty in walking, not elsewhere classified: Secondary | ICD-10-CM | POA: Diagnosis not present

## 2021-11-20 NOTE — Therapy (Signed)
[place] [place], [place], [address] Phone: [phone]   Fax:  [phone]  Physical Therapy Treatment  Patient Details  Name: Cassandra [name] MRN: [id number] Date of Birth: [birth year] Referring Provider (PT): Aluisio   Encounter Date: 11/20/2021   PT End of Session - 11/20/21 1444     Visit Number 5    Number of Visits 19    Date for PT Re-Evaluation 12/21/21    Authorization Type [employer]    Authorization Time Period 11/09/21 to 12/21/21    Progress Note Due on Visit 10    PT Start Time 1404    PT Stop Time 1444    PT Time Calculation (min) 40 min    Activity Tolerance Patient tolerated treatment well    Behavior During Therapy [facility] for tasks assessed/performed             Past Medical History:  Diagnosis Date   Arthritis    Cancer ([place])    Breast cancer right '06- surgery, chemo, radiation- released after 5 yrs   Dyspnea    in 2020 had pe placed on eliquis   Hx of seasonal allergies    OTC med used   Hypertension    Hypothyroidism    Pulmonary embolism ([place])    hx of in 2020 placed on eliquis   Sleep apnea    cpap    Past Surgical History:  Procedure Laterality Date   BREAST SURGERY Right    modified partial mastectomy/ lymph nodes dissection   COLON SURGERY  2/17   INCISIONAL HERNIA REPAIR N/A 04/06/2016   Procedure: LAPAROSCOPIC ASSISTED INCISIONAL HERNIA REPAIR WITH MESH;  Surgeon: Leighton Ruff, MD;  Location: [facility];  Service: General;  Laterality: N/A;   TONSILLECTOMY     TOTAL KNEE ARTHROPLASTY Left 12/19/2020   Procedure: TOTAL KNEE ARTHROPLASTY;  Surgeon: Gaynelle Arabian, MD;  Location: [facility];  Service: Orthopedics;  Laterality: Left;  21min   TOTAL KNEE ARTHROPLASTY Right 11/06/2021   Procedure: TOTAL KNEE ARTHROPLASTY;  Surgeon: Gaynelle Arabian, MD;  Location: [facility];  Service: Orthopedics;  Laterality: Right;    There were no vitals filed for this visit.   Subjective  Assessment - 11/20/21 1406     Subjective I had to go to a funeral saturday and had to stand and walk a lot, Sunday was awful. My knee was feeling better before this, I felt more stable. The nerve symptoms were not overwhelming but I really felt it on Sunday. I've worked on keeping this leg elevated so the swelling will go down. I'm doing well if I get to ice one time. I'd rather drop to less than 3 times a week becuase my knee doesn't like the pain coming 3x/week but I know its necessary.    Patient Stated Goals get moving better, get pain down    Currently in Pain? Yes    Pain Score 4     Pain Location Knee    Pain Orientation Right    Pain Descriptors / Indicators Sharp    Pain Type Surgical pain                [facility] PT Assessment - 11/20/21 0001       AROM   Right Knee Extension 19    Right Knee Flexion 100  [place] Adult PT Treatment/Exercise - 11/20/21 0001       Knee/Hip Exercises: Aerobic   Recumbent Bike bike seat 10 x5 minutes for knee ROM      Knee/Hip Exercises: Standing   Other Standing Knee Exercises TKEs standing 1x10 3 second holds      Knee/Hip Exercises: Seated   Long Arc Quad Right;1 set;10 reps    Long [employer] Limitations 3 second holds    Other Seated Knee/Hip Exercises Knee flexion ROM with self overpressure x15   5 second holds   Hamstring Curl Right;1 set;10 reps      Knee/Hip Exercises: Supine   Quad Sets Right;1 set;20 reps    [employer] Limitations 5 second holds    Short Arc [employer] Right;1 set;15 reps    Short Arc [employer] Limitations 5 second holds    Straight Leg Raises Strengthening;Right;1 set;15 reps    Straight Leg Raises Limitations with quad set                     PT Education - 11/20/21 1444     Education Details importance of consistent compliance with elevation and ice at the very least, also encouraged HEP and safe use of RW    Person(s) Educated Patient    Methods  Explanation    Comprehension Verbalized understanding              PT Short Term Goals - 11/17/21 1121       PT SHORT TERM GOAL #1   Title Will be independent iwth appropriate progressive HEP    Baseline Patient reports inconsistent performance    Time 2    Period Weeks    Status On-going    Target Date 11/30/21      PT SHORT TERM GOAL #2   Title R knee ROM to be no more than 8 degrees extension and will be at least 100 degrees flexion    Baseline 28-107    Time 2    Period Weeks    Status On-going      PT SHORT TERM GOAL #3   Title Gait pattern will improve to consistent heel-toe, step through pattern with upright posture, even step and stride lengths, and minimal unsteadiness with LRAD    Baseline VC for step through gait pattern.    Time 2    Period Weeks    Status On-going      PT SHORT TERM GOAL #4   Title Will be independent with edema management strategies    Baseline Educated to elevate while lying in supine. She reports that she stays in recliner, but RLE with severe edema.    Time 2    Period Weeks    Status On-going               PT Long Term Goals - 11/09/21 1603       PT LONG TERM GOAL #1   Title MMT to improve by at least 1 grade in all weak groups    Time 6    Period Weeks    Status New    Target Date 12/21/21      PT LONG TERM GOAL #2   Title Will demonstrate R knee extension of no more than 5 degrees, and R knee flexion of at least 110 degrees    Time 6    Period Weeks    Status New      PT LONG TERM GOAL #3  Title Will be able to ascend and descend at least 4 stairs with U rail and step over step pattern without difficulty    Time 6    Period Weeks    Status New      PT LONG TERM GOAL #4   Title Will be able to complete TUG with LRAD in 20 seconds or less to show improved balance and mobility    Time 6    Period Weeks    Status New                   Plan - 11/20/21 1445     Clinical Impression Statement Cassandra [name]  arrives today reporting that she is having a lot of pain in this knee; did perseverate on her pain quite a bit today, also still not doing her HEP or really being consistent with recommendations for ice regimen at home. I spent a lot of time discussing importance of consistent compliance with elevation and ice at the very least, also encouraged HEP and safe use of RW (she is very unsafe with transfers with this in clinic). She asked again about dropping to 2x/week instead of three, I very frankly told her this would lead to poor outcomes possibly including return to surgery for knee manipulation and she is willing to continue at 3x/week for the time being. Continued working on lower level exercises today (limited by pain levels and edema). Will continue to progress as able and tolerated.  ROM 19 to about 100 degrees. She goes back to see Dr. Wynelle Link tomorrow.    Personal Factors and Comorbidities Fitness;Past/Current Experience;Comorbidity 1;Time since onset of injury/illness/exacerbation    Examination-Activity Limitations Locomotion Level;Transfers;Bed Mobility;Bend;Sit;Sleep;Squat;Stairs;Lift;Stand    Examination-Participation Restrictions Community Activity;Driving;Shop;Laundry;Yard Work    Merchant navy officer Stable/Uncomplicated    Designer, jewellery Low    Rehab Potential Good    PT Frequency 3x / week    PT Duration 6 weeks    PT Treatment/Interventions Cryotherapy;Moist Heat;Vasopneumatic Device;Gait training;Electrical Stimulation;Iontophoresis 4mg /ml Dexamethasone;Therapeutic exercise;Ultrasound;Manual techniques;Neuromuscular re-education;Therapeutic activities;ADLs/Self Care Home Management    PT Next Visit Plan focus on ROM, gait training, strength, balance, edema control    PT Home Exercise Plan still on hospital HEP    Consulted and Agree with Plan of Care Patient             Patient will benefit from skilled therapeutic intervention in order to improve the  following deficits and impairments:  Abnormal gait, Decreased range of motion, Difficulty walking, Obesity, Increased muscle spasms, Decreased activity tolerance, Decreased knowledge of precautions, Pain, Decreased balance, Decreased knowledge of use of DME, Decreased scar mobility, Hypomobility, Impaired flexibility, Decreased mobility, Decreased strength, Postural dysfunction, Impaired sensation, Increased edema  Visit Diagnosis: Acute pain of right knee  Difficulty in walking, not elsewhere classified  Localized edema  Stiffness of right knee, not elsewhere classified     Problem List Patient Active Problem List   Diagnosis Date Noted   OA (osteoarthritis) of knee 12/19/2020   Primary osteoarthritis of left knee 12/19/2020   Obstructive sleep apnea 06/19/2019   Malignant neoplasm of overlapping sites of right breast in female, estrogen receptor positive ([place]) 05/14/2019   Coagulopathy ([place]) 05/14/2019   Morbid obesity ([place]) 05/14/2019   Chronic thromboembolic disease ([place]) 47/42/5956   Pulmonary vascular disease ([place]) 04/08/2019   Hypothyroidism 04/07/2019   Essential hypertension 04/07/2019   Pulmonary embolism ([place]) 04/06/2019   Abnormal liver function 04/06/2019   Hypokalemia 04/06/2019   Incisional hernia 04/06/2016  Ann Lions PT, DPT, PN2   Supplemental Physical Therapist [place] [place], [place], [address] Phone: [phone]   Fax:  [phone]  Name: Cassandra [name] MRN: [id number] Date of Birth: [birth year]

## 2021-11-21 NOTE — Discharge Summary (Signed)
Physician Discharge Summary   Patient ID: Cassandra [name] MRN: [id number] DOB/AGE: [birth year] 76 y.o.  Admit date: 11/06/2021 Discharge date: 11/07/2021  Primary Diagnosis: OA of Right Knee  Admission Diagnoses:  Past Medical History:  Diagnosis Date   Arthritis    Cancer [facility]    Breast cancer right '06- surgery, chemo, radiation- released after 5 yrs   Dyspnea    in 2020 had pe placed on eliquis   Hx of seasonal allergies    OTC med used   Hypertension    Hypothyroidism    Pulmonary embolism ([place])    hx of in 2020 placed on eliquis   Sleep apnea    cpap   Discharge Diagnoses:   Principal Problem:   Primary osteoarthritis of left knee  Estimated body mass index is 39.06 kg/m as calculated from the following:   Height as of this encounter: 5' (1.524 m).   Weight as of this encounter: 90.7 kg.  Procedure:  Procedure(s) (LRB): TOTAL KNEE ARTHROPLASTY (Right)   Consults: None  HPI: Cassandra [name] is a 76 y.o. year old female with end stage OA of her right knee with progressively worsening pain and dysfunction. She has constant pain, with activity and at rest and significant functional deficits with difficulties even with ADLs. She has had extensive non-op management including analgesics, injections of cortisone and viscosupplements, and home exercise program, but remains in significant pain with significant dysfunction.Radiographs show bone on bone arthritis medial and patellofemoral. She presents now for right Total Knee Arthroplasty.     Laboratory Data: Admission on 11/06/2021, Discharged on 11/07/2021  Component Date Value Ref Range Status   SARS Coronavirus 2 11/06/2021 NEGATIVE  NEGATIVE Final   Comment: (NOTE) SARS-CoV-2 target nucleic acids are NOT DETECTED.  The SARS-CoV-2 RNA is generally detectable in upper and lower respiratory specimens during the acute phase of infection. The lowest concentration of SARS-CoV-2 viral copies this assay can detect is  250 copies / mL. A negative result does not preclude SARS-CoV-2 infection and should not be used as the sole basis for treatment or other patient management decisions.  A negative result may occur with improper specimen collection / handling, submission of specimen other than nasopharyngeal swab, presence of viral mutation(s) within the areas targeted by this assay, and inadequate number of viral copies (<250 copies / mL). A negative result must be combined with clinical observations, patient history, and epidemiological information.  Fact Sheet for Patients:   StrictlyIdeas.no  Fact Sheet for Healthcare Providers: BankingDealers.co.za  This test is not yet approved or                           cleared by the [place] FDA and has been authorized for detection and/or diagnosis of SARS-CoV-2 by FDA under an Emergency Use Authorization (EUA).  This EUA will remain in effect (meaning this test can be used) for the duration of the COVID-19 declaration under Section 564(b)(1) of the Act, 21 U.S.C. section 360bbb-3(b)(1), unless the authorization is terminated or revoked sooner.  Performed at [facility], [place] [address] [place], [place] [address]    WBC 11/07/2021 13.3 (H)  4.0 - 10.5 K/uL Final   RBC 11/07/2021 3.89  3.87 - 5.11 MIL/uL Final   Hemoglobin 11/07/2021 12.3  12.0 - 15.0 g/dL Final   HCT 11/07/2021 37.6  36.0 - 46.0 % Final   MCV 11/07/2021 96.7  80.0 - 100.0 fL Final  MCH 11/07/2021 31.6  26.0 - 34.0 pg Final   MCHC 11/07/2021 32.7  30.0 - 36.0 g/dL Final   RDW 11/07/2021 14.4  11.5 - 15.5 % Final   Platelets 11/07/2021 319  150 - 400 K/uL Final   nRBC 11/07/2021 0.0  0.0 - 0.2 % Final   Performed at [facility], [place] [address] [place], [place] [address]   Sodium 11/07/2021 135  135 - 145 mmol/L Final   Potassium 11/07/2021 3.9  3.5 - 5.1 mmol/L Final   Chloride 11/07/2021 104   98 - 111 mmol/L Final   CO2 11/07/2021 22  22 - 32 mmol/L Final   Glucose, Bld 11/07/2021 151 (H)  70 - 99 mg/dL Final   Glucose reference range applies only to samples taken after fasting for at least 8 hours.   BUN 11/07/2021 18  8 - 23 mg/dL Final   Creatinine, Ser 11/07/2021 0.94  0.44 - 1.00 mg/dL Final   Calcium 11/07/2021 8.8 (L)  8.9 - 10.3 mg/dL Final   GFR, Estimated 11/07/2021 >60  >60 mL/min Final   Comment: (NOTE) Calculated using the CKD-EPI Creatinine Equation (2021)    Anion gap 11/07/2021 9  5 - 15 Final   Performed at [facility], [place] [address] [place], [place] [address]  Hospital Outpatient Visit on 10/26/2021  Component Date Value Ref Range Status   WBC 10/26/2021 7.4  4.0 - 10.5 K/uL Final   RBC 10/26/2021 4.20  3.87 - 5.11 MIL/uL Final   Hemoglobin 10/26/2021 13.2  12.0 - 15.0 g/dL Final   HCT 10/26/2021 40.9  36.0 - 46.0 % Final   MCV 10/26/2021 97.4  80.0 - 100.0 fL Final   MCH 10/26/2021 31.4  26.0 - 34.0 pg Final   MCHC 10/26/2021 32.3  30.0 - 36.0 g/dL Final   RDW 10/26/2021 14.5  11.5 - 15.5 % Final   Platelets 10/26/2021 343  150 - 400 K/uL Final   nRBC 10/26/2021 0.0  0.0 - 0.2 % Final   Performed at [facility], [place] [address], [place], [place] [address]   Sodium 10/26/2021 141  135 - 145 mmol/L Final   Potassium 10/26/2021 4.3  3.5 - 5.1 mmol/L Final   Chloride 10/26/2021 103  98 - 111 mmol/L Final   CO2 10/26/2021 30  22 - 32 mmol/L Final   Glucose, Bld 10/26/2021 104 (H)  70 - 99 mg/dL Final   Glucose reference range applies only to samples taken after fasting for at least 8 hours.   BUN 10/26/2021 19  8 - 23 mg/dL Final   Creatinine, Ser 10/26/2021 0.83  0.44 - 1.00 mg/dL Final   Calcium 10/26/2021 10.3  8.9 - 10.3 mg/dL Final   Total Protein 10/26/2021 7.6  6.5 - 8.1 g/dL Final   Albumin 10/26/2021 4.3  3.5 - 5.0 g/dL Final   AST 10/26/2021 21  15 - 41 U/L Final   ALT 10/26/2021 19  0 - 44 U/L Final    Alkaline Phosphatase 10/26/2021 70  38 - 126 U/L Final   Total Bilirubin 10/26/2021 0.4  0.3 - 1.2 mg/dL Final   GFR, Estimated 10/26/2021 >60  >60 mL/min Final   Comment: (NOTE) Calculated using the CKD-EPI Creatinine Equation (2021)    Anion gap 10/26/2021 8  5 - 15 Final   Performed at [facility], [place] [address], [place], [place] [address]   MRSA, PCR 10/26/2021 NEGATIVE  NEGATIVE Final  Staphylococcus aureus 10/26/2021 NEGATIVE  NEGATIVE Final   Comment: (NOTE) The Xpert SA Assay (FDA approved for NASAL specimens in patients 51 years of age and older), is one component of a comprehensive surveillance program. It is not intended to diagnose infection nor to guide or monitor treatment. Performed at [facility], [place] [address] [place], [place] [address]    Prothrombin Time 10/26/2021 14.2  11.4 - 15.2 seconds Final   INR 10/26/2021 1.1  0.8 - 1.2 Final   Comment: (NOTE) INR goal varies based on device and disease states. Performed at [facility], [place] [address] [place], [place] [address]      X-Rays:No results found.  EKG: Orders placed or performed during the hospital encounter of 10/26/21   EKG 12 lead per protocol   EKG 12 lead per protocol     Hospital Course: Cassandra [name] is a 76 y.o. who was admitted to [facility]. They were brought to the operating room on 11/06/2021 and underwent Procedure(s): TOTAL KNEE ARTHROPLASTY.  Patient tolerated the procedure well and was later transferred to the recovery room and then to the orthopaedic floor for postoperative care. They were given PO and IV analgesics for pain control following their surgery. They were given 24 hours of postoperative antibiotics of  Anti-infectives (From admission, onward)    Start     Dose/Rate Route Frequency Ordered Stop   11/06/21 1900  ceFAZolin (ANCEF) IVPB 2g/100 mL premix        2 g 200 mL/hr over 30 Minutes Intravenous  Every 6 hours 11/06/21 1528 11/07/21 0114   11/06/21 1030  ceFAZolin (ANCEF) IVPB 2g/100 mL premix        2 g 200 mL/hr over 30 Minutes Intravenous On call to O.R. 11/06/21 1019 11/06/21 1307      and started on DVT prophylaxis in the form of TED hose and Eliquis .   PT and OT were ordered for total joint protocol. Discharge planning consulted to help with postop disposition and equipment needs.  Patient had an uneventful night on the evening of surgery. They started to get up OOB with therapy on POD#1. Pt was seen during rounds and was ready to go home pending progress with therapy. She worked with therapy on POD #1 and was meeting  goals. Pt was discharged to home later that day in stable condition.  Diet: Regular diet Activity: WBAT Follow-up: in 2 weeks Disposition: Home Discharged Condition: good   Discharge Instructions     Call MD / Call 911   Complete by: As directed    If you experience chest pain or shortness of breath, CALL 911 and be transported to the hospital emergency room.  If you develope a fever above 101 F, pus (white drainage) or increased drainage or redness at the wound, or calf pain, call your surgeon's office.   Change dressing   Complete by: As directed    You may remove the bulky bandage (ACE wrap and gauze) two days after surgery. You will have an adhesive waterproof bandage underneath. Leave this in place until your first follow-up appointment.   Constipation Prevention   Complete by: As directed    Drink plenty of fluids.  Prune juice may be helpful.  You may use a stool softener, such as Colace (over the counter) 100 mg twice a day.  Use MiraLax (over the counter) for constipation as needed.   Diet - low sodium heart healthy   Complete  by: As directed    Do not put a pillow under the knee. Place it under the heel.   Complete by: As directed    Driving restrictions   Complete by: As directed    No driving for two weeks   Post-operative opioid taper  instructions:   Complete by: As directed    POST-OPERATIVE OPIOID TAPER INSTRUCTIONS: It is important to wean off of your opioid medication as soon as possible. If you do not need pain medication after your surgery it is ok to stop day one. Opioids include: Codeine, Hydrocodone(Norco, Vicodin), Oxycodone(Percocet, oxycontin) and hydromorphone amongst others.  Long term and even short term use of opiods can cause: Increased pain response Dependence Constipation Depression Respiratory depression And more.  Withdrawal symptoms can include Flu like symptoms Nausea, vomiting And more Techniques to manage these symptoms Hydrate well Eat regular healthy meals Stay active Use relaxation techniques(deep breathing, meditating, yoga) Do Not substitute Alcohol to help with tapering If you have been on opioids for less than two weeks and do not have pain than it is ok to stop all together.  Plan to wean off of opioids This plan should start within one week post op of your joint replacement. Maintain the same interval or time between taking each dose and first decrease the dose.  Cut the total daily intake of opioids by one tablet each day Next start to increase the time between doses. The last dose that should be eliminated is the evening dose.      TED hose   Complete by: As directed    Use stockings (TED hose) for three weeks on both leg(s).  You may remove them at night for sleeping.   Weight bearing as tolerated   Complete by: As directed       Allergies as of 11/07/2021   No Known Allergies      Medication List     TAKE these medications    acetaminophen 500 MG tablet Commonly known as: TYLENOL Take 1,000 mg by mouth every 6 (six) hours as needed (for pain.). Notes to patient: Last dose given 02/14 06:07am   apixaban 5 MG Tabs tablet Commonly known as: ELIQUIS Take 5 mg by mouth 2 (two) times daily.   atorvastatin 80 MG tablet Commonly known as: LIPITOR Take 80 mg  by mouth every evening. Notes to patient: Resume home regimen   b complex vitamins capsule Take 1 capsule by mouth in the morning. Notes to patient: Resume home regimen   benazepril 40 MG tablet Commonly known as: LOTENSIN Take 40 mg by mouth in the morning. Pt takes in the pm per pt Notes to patient: Resume home regimen   bisoprolol-hydrochlorothiazide 10-6.25 MG tablet Commonly known as: ZIAC Take 1 tablet by mouth every morning.   CALCIUM PO Take 600 mg by mouth 2 (two) times daily. Notes to patient: Resume home regimen   cholecalciferol 25 MCG (1000 UNIT) tablet Commonly known as: VITAMIN D Take 1,000 Units by mouth in the morning and at bedtime. Notes to patient: Resume home regimen   CO Q 10 PO Take 100 mg by mouth every morning. Notes to patient: Resume home regimen   Fish Oil 1000 MG Caps Take 1,000 mg by mouth every morning. Notes to patient: Resume home regimen   gabapentin 300 MG capsule Commonly known as: NEURONTIN Take a 300 mg capsule three times a day for two weeks following surgery.Then take a 300 mg capsule two times a day for  two weeks. Then take a 300 mg capsule once a day for two weeks. Then discontinue.   levothyroxine 100 MCG tablet Commonly known as: SYNTHROID Take 100 mcg by mouth daily before breakfast.   loratadine 10 MG tablet Commonly known as: CLARITIN Take 10 mg by mouth every morning.   Lysine 500 MG Caps Take 500 mg by mouth every morning. Notes to patient: Resume home regimen   methocarbamol 500 MG tablet Commonly known as: ROBAXIN Take 1 tablet (500 mg total) by mouth every 6 (six) hours as needed for muscle spasms. Notes to patient: Last dose given 02/14 06:05am   multivitamin with minerals Tabs tablet Take 1 tablet by mouth every morning. Notes to patient: Resume home regimen   NIFEdipine 30 MG 24 hr tablet Commonly known as: ADALAT CC Take 30 mg by mouth daily before breakfast.   oxyCODONE 5 MG immediate release  tablet Commonly known as: Oxy IR/ROXICODONE Take 1-2 tablets (5-10 mg total) by mouth every 6 (six) hours as needed for severe pain. Notes to patient: Last dose given 02/14 11:12am   traMADol 50 MG tablet Commonly known as: ULTRAM Take 1-2 tablets (50-100 mg total) by mouth every 6 (six) hours as needed for moderate pain.   TURMERIC PO Take 1 capsule by mouth every evening. Notes to patient: Resume home regimen   Vitamin A 2400 MCG (8000 UT) Caps Take 8,000 Units by mouth in the morning. Notes to patient: Resume home regimen   vitamin E 180 MG (400 UNITS) capsule Take 400 Units by mouth in the morning. Notes to patient: Resume home regimen               Discharge Care Instructions  (From admission, onward)           Start     Ordered   11/07/21 0000  Weight bearing as tolerated        11/07/21 0718   11/07/21 0000  Change dressing       Comments: You may remove the bulky bandage (ACE wrap and gauze) two days after surgery. You will have an adhesive waterproof bandage underneath. Leave this in place until your first follow-up appointment.   11/07/21 3343            Follow-up Information     Gaynelle Arabian, MD. Go on 11/21/2021.   Specialty: Orthopedic Surgery Why: You are scheduled for first post op appointment on Tuesday February 28th at 1:30pm. Contact information: [address] [place] [address] [phone]                 Signed: Fenton Foy, PA-C Orthopedic Surgery 11/21/2021, 2:50 PM

## 2021-11-22 ENCOUNTER — Ambulatory Visit: Payer: Medicare HMO | Attending: Orthopedic Surgery | Admitting: Physical Therapy

## 2021-11-22 ENCOUNTER — Encounter: Payer: Self-pay | Admitting: Physical Therapy

## 2021-11-22 ENCOUNTER — Other Ambulatory Visit: Payer: Self-pay

## 2021-11-22 DIAGNOSIS — M25661 Stiffness of right knee, not elsewhere classified: Secondary | ICD-10-CM | POA: Insufficient documentation

## 2021-11-22 DIAGNOSIS — R262 Difficulty in walking, not elsewhere classified: Secondary | ICD-10-CM | POA: Insufficient documentation

## 2021-11-22 DIAGNOSIS — M25561 Pain in right knee: Secondary | ICD-10-CM | POA: Insufficient documentation

## 2021-11-22 DIAGNOSIS — R600 Localized edema: Secondary | ICD-10-CM | POA: Diagnosis not present

## 2021-11-22 NOTE — Therapy (Signed)
[place]. [place], [place], [address] Phone: [phone]   Fax:  [phone]  Physical Therapy Treatment  Patient Details  Name: Cassandra [name] MRN: [id number] Date of Birth: Feb 08, 1946 Referring Provider (PT): Aluisio   Encounter Date: 11/22/2021   PT End of Session - 11/22/21 1442     Visit Number 6    Number of Visits 19    Date for PT Re-Evaluation 12/21/21    Authorization Type [employer]    Authorization Time Period 11/09/21 to 12/21/21    Progress Note Due on Visit 10    PT Start Time 1401    PT Stop Time 1441    PT Time Calculation (min) 40 min    Activity Tolerance Patient tolerated treatment well    Behavior During Therapy [facility] for tasks assessed/performed             Past Medical History:  Diagnosis Date   Arthritis    Cancer ([place])    Breast cancer right '06- surgery, chemo, radiation- released after 5 yrs   Dyspnea    in 2020 had pe placed on eliquis   Hx of seasonal allergies    OTC med used   Hypertension    Hypothyroidism    Pulmonary embolism ([place])    hx of in 2020 placed on eliquis   Sleep apnea    cpap    Past Surgical History:  Procedure Laterality Date   BREAST SURGERY Right    modified partial mastectomy/ lymph nodes dissection   COLON SURGERY  2/17   INCISIONAL HERNIA REPAIR N/A 04/06/2016   Procedure: LAPAROSCOPIC ASSISTED INCISIONAL HERNIA REPAIR WITH MESH;  Surgeon: Leighton Ruff, MD;  Location: [facility];  Service: General;  Laterality: N/A;   TONSILLECTOMY     TOTAL KNEE ARTHROPLASTY Left 12/19/2020   Procedure: TOTAL KNEE ARTHROPLASTY;  Surgeon: Gaynelle Arabian, MD;  Location: [facility];  Service: Orthopedics;  Laterality: Left;  82min   TOTAL KNEE ARTHROPLASTY Right 11/06/2021   Procedure: TOTAL KNEE ARTHROPLASTY;  Surgeon: Gaynelle Arabian, MD;  Location: [facility];  Service: Orthopedics;  Laterality: Right;    There were no vitals filed for this visit.   Subjective  Assessment - 11/22/21 1404     Subjective I'm tired of having pain after surgery; I saw Dr. Peri Maris PA yesterday and they said I was doing well and ahead of schedule. They think that I have a pocket of blood on the front of my shin, they are just watching it now. Happy with ROM for now but need to keep working on it. Still not doing HEP at home.    Currently in Pain? Yes    Pain Score 2     Pain Location Knee    Pain Orientation Right    Pain Descriptors / Indicators Aching    Pain Type Surgical pain                               [facility] Adult PT Treatment/Exercise - 11/22/21 0001       Knee/Hip Exercises: Aerobic   Recumbent Bike bike seat 10 x6 minutes for knee ROM      Knee/Hip Exercises: Standing   Other Standing Knee Exercises R knee flexion stretch on 8 inch step 5x3 second holds      Knee/Hip Exercises: Supine   Quad Sets Right;1 set;20 reps    Quad Sets Limitations 5  second holds    Short [employer] Limitations 2#    Heel Slides 10 reps    Heel Slides Limitations with over pressure from PT    Straight Leg Raises Strengthening;Right;1 set;15 reps    Straight Leg Raises Limitations with quad set   2#   Other Supine Knee/Hip Exercises supine knee extension overpressure 1x10      Manual Therapy   Manual Therapy Joint mobilization;Soft tissue mobilization    Edema Management gentle scar massage/mobility work    Joint Mobilization patella mobs all directions                     PT Education - 11/22/21 1441     Education Details use sharpie to help track redness/spreading of lump on her shin    Person(s) Educated Patient    Methods Explanation    Comprehension Verbalized understanding              PT Short Term Goals - 11/17/21 1121       PT SHORT TERM GOAL #1   Title Will be independent iwth appropriate progressive HEP    Baseline Patient reports inconsistent performance    Time 2     Period Weeks    Status On-going    Target Date 11/30/21      PT SHORT TERM GOAL #2   Title R knee ROM to be no more than 8 degrees extension and will be at least 100 degrees flexion    Baseline 28-107    Time 2    Period Weeks    Status On-going      PT SHORT TERM GOAL #3   Title Gait pattern will improve to consistent heel-toe, step through pattern with upright posture, even step and stride lengths, and minimal unsteadiness with LRAD    Baseline VC for step through gait pattern.    Time 2    Period Weeks    Status On-going      PT SHORT TERM GOAL #4   Title Will be independent with edema management strategies    Baseline Educated to elevate while lying in supine. She reports that she stays in recliner, but RLE with severe edema.    Time 2    Period Weeks    Status On-going               PT Long Term Goals - 11/09/21 1603       PT LONG TERM GOAL #1   Title MMT to improve by at least 1 grade in all weak groups    Time 6    Period Weeks    Status New    Target Date 12/21/21      PT LONG TERM GOAL #2   Title Will demonstrate R knee extension of no more than 5 degrees, and R knee flexion of at least 110 degrees    Time 6    Period Weeks    Status New      PT LONG TERM GOAL #3   Title Will be able to ascend and descend at least 4 stairs with U rail and step over step pattern without difficulty    Time 6    Period Weeks    Status New      PT LONG TERM GOAL #4   Title Will be able to complete TUG with LRAD in 20 seconds or less to show improved  balance and mobility    Time 6    Period Weeks    Status New                   Plan - 11/22/21 1442     Clinical Impression Statement Syncere arrives today doing well, saw the surgical PA yesterday and they are happy with her progress; she does have what they think is a possible subcutaneous collection of blood below her incision, for right now we are just to monitor and ice with no restrictions. I did circle  it with sharpie today so we can track it more easily- it was present when I saw her Monday but it was not red like it was this afternoon. Kept working on exercises as appropriate for her stage s/p TKR surgery, will continue efforts as appropriate moving forward.    Personal Factors and Comorbidities Fitness;Past/Current Experience;Comorbidity 1;Time since onset of injury/illness/exacerbation    Examination-Activity Limitations Locomotion Level;Transfers;Bed Mobility;Bend;Sit;Sleep;Squat;Stairs;Lift;Stand    Examination-Participation Restrictions Community Activity;Driving;Shop;Laundry;Yard Work    Merchant navy officer Stable/Uncomplicated    Designer, jewellery Low    Rehab Potential Good    PT Frequency 3x / week    PT Duration 6 weeks    PT Treatment/Interventions Cryotherapy;Moist Heat;Vasopneumatic Device;Gait training;Electrical Stimulation;Iontophoresis 4mg /ml Dexamethasone;Therapeutic exercise;Ultrasound;Manual techniques;Neuromuscular re-education;Therapeutic activities;ADLs/Self Care Home Management    PT Next Visit Plan focus on ROM, gait training, strength, balance, edema control    PT Home Exercise Plan still on hospital HEP    Consulted and Agree with Plan of Care Patient             Patient will benefit from skilled therapeutic intervention in order to improve the following deficits and impairments:  Abnormal gait, Decreased range of motion, Difficulty walking, Obesity, Increased muscle spasms, Decreased activity tolerance, Decreased knowledge of precautions, Pain, Decreased balance, Decreased knowledge of use of DME, Decreased scar mobility, Hypomobility, Impaired flexibility, Decreased mobility, Decreased strength, Postural dysfunction, Impaired sensation, Increased edema  Visit Diagnosis: Acute pain of right knee  Localized edema  Difficulty in walking, not elsewhere classified  Stiffness of right knee, not elsewhere classified     Problem  List Patient Active Problem List   Diagnosis Date Noted   OA (osteoarthritis) of knee 12/19/2020   Primary osteoarthritis of left knee 12/19/2020   Obstructive sleep apnea 06/19/2019   Malignant neoplasm of overlapping sites of right breast in female, estrogen receptor positive ([place]) 05/14/2019   Coagulopathy ([place]) 05/14/2019   Morbid obesity ([place]) 05/14/2019   Chronic thromboembolic disease ([place]) 19/37/9024   Pulmonary vascular disease ([place]) 04/08/2019   Hypothyroidism 04/07/2019   Essential hypertension 04/07/2019   Pulmonary embolism ([place]) 04/06/2019   Abnormal liver function 04/06/2019   Hypokalemia 04/06/2019   Incisional hernia 04/06/2016   Ann Lions PT, DPT, PN2   Supplemental Physical Therapist [place]. [place], [place], [address] Phone: [phone]   Fax:  [phone]  Name: TIFFANNIE SLOSS MRN: [id number] Date of Birth: May 15, 1946

## 2021-11-24 ENCOUNTER — Other Ambulatory Visit: Payer: Self-pay

## 2021-11-24 ENCOUNTER — Encounter: Payer: Self-pay | Admitting: Physical Therapy

## 2021-11-24 ENCOUNTER — Ambulatory Visit: Payer: Medicare HMO | Admitting: Physical Therapy

## 2021-11-24 DIAGNOSIS — M25661 Stiffness of right knee, not elsewhere classified: Secondary | ICD-10-CM | POA: Diagnosis not present

## 2021-11-24 DIAGNOSIS — R600 Localized edema: Secondary | ICD-10-CM

## 2021-11-24 DIAGNOSIS — R262 Difficulty in walking, not elsewhere classified: Secondary | ICD-10-CM | POA: Diagnosis not present

## 2021-11-24 DIAGNOSIS — M25561 Pain in right knee: Secondary | ICD-10-CM

## 2021-11-24 NOTE — Therapy (Signed)
[place]. [place], [place], [address] Phone: [phone]   Fax:  [phone]  Physical Therapy Treatment  Patient Details  Name: Cassandra [name] MRN: [id number] Date of Birth: 1946-09-11 Referring Provider (PT): Aluisio   Encounter Date: 11/24/2021   PT End of Session - 11/24/21 1149     Visit Number 7    Number of Visits 19    Date for PT Re-Evaluation 12/21/21    Authorization Type [employer]    Authorization Time Period 11/09/21 to 12/21/21    Progress Note Due on Visit 10    PT Start Time 1108   arrived late   PT Stop Time 1147    PT Time Calculation (min) 39 min    Activity Tolerance Patient tolerated treatment well    Behavior During Therapy [facility] for tasks assessed/performed             Past Medical History:  Diagnosis Date   Arthritis    Cancer ([place])    Breast cancer right '06- surgery, chemo, radiation- released after 5 yrs   Dyspnea    in 2020 had pe placed on eliquis   Hx of seasonal allergies    OTC med used   Hypertension    Hypothyroidism    Pulmonary embolism ([place])    hx of in 2020 placed on eliquis   Sleep apnea    cpap    Past Surgical History:  Procedure Laterality Date   BREAST SURGERY Right    modified partial mastectomy/ lymph nodes dissection   COLON SURGERY  2/17   INCISIONAL HERNIA REPAIR N/A 04/06/2016   Procedure: LAPAROSCOPIC ASSISTED INCISIONAL HERNIA REPAIR WITH MESH;  Surgeon: Leighton Ruff, MD;  [facility];  Service: General;  Laterality: N/A;   TONSILLECTOMY     TOTAL KNEE ARTHROPLASTY Left 12/19/2020   Procedure: TOTAL KNEE ARTHROPLASTY;  Surgeon: Gaynelle Arabian, MD;  [facility]: [facility];  Service: Orthopedics;  Laterality: Left;  4min   TOTAL KNEE ARTHROPLASTY Right 11/06/2021   Procedure: TOTAL KNEE ARTHROPLASTY;  Surgeon: Gaynelle Arabian, MD;  [facility];  Service: Orthopedics;  Laterality: Right;    There were no vitals filed for this visit.    Subjective Assessment - 11/24/21 1108     Subjective I felt OK after last time, the next day I ended up not even getting out of bed until 5pm due to fatigue and pain.    Patient Stated Goals get moving better, get pain down    Currently in Pain? Yes    Pain Score 3     Pain Location Knee    Pain Orientation Right    Pain Descriptors / Indicators Aching                               [facility] Adult PT Treatment/Exercise - 11/24/21 0001       Knee/Hip Exercises: Stretches   Active Hamstring Stretch Right;3 reps;30 seconds    Other Knee/Hip Stretches standing R knee flexion stretch 5 second holds x10 8 inch step      Knee/Hip Exercises: Aerobic   Recumbent Bike bike seat 10 x6 minutes for knee ROM      Knee/Hip Exercises: Seated   Other Seated Knee/Hip Exercises Knee flexion ROM with self overpressure x15   10x10 second holds   Other Seated Knee/Hip Exercises alternating LAQs with hamstring curls 1x10 2# weight 3 second holds  at each end range      Knee/Hip Exercises: Supine   Quad Sets Right;1 set;20 reps    [employer] Limitations 5 second holds    Short Arc [employer] Right;1 set;15 reps    Short Arc Quad Sets Limitations 2#    Bridges Both;1 set;10 reps    Straight Leg Raises Strengthening;Right;1 set;15 reps    Straight Leg Raises Limitations with quad set and 2#    Other Supine Knee/Hip Exercises supine knee extension overpressure 1x10                     PT Education - 11/24/21 1149     Education Details exericse form/purpose    Person(s) Educated Patient    Methods Explanation    Comprehension Verbalized understanding              PT Short Term Goals - 11/17/21 1121       PT SHORT TERM GOAL #1   Title Will be independent iwth appropriate progressive HEP    Baseline Patient reports inconsistent performance    Time 2    Period Weeks    Status On-going    Target Date 11/30/21      PT SHORT TERM GOAL #2   Title R knee ROM to be  no more than 8 degrees extension and will be at least 100 degrees flexion    Baseline 28-107    Time 2    Period Weeks    Status On-going      PT SHORT TERM GOAL #3   Title Gait pattern will improve to consistent heel-toe, step through pattern with upright posture, even step and stride lengths, and minimal unsteadiness with LRAD    Baseline VC for step through gait pattern.    Time 2    Period Weeks    Status On-going      PT SHORT TERM GOAL #4   Title Will be independent with edema management strategies    Baseline Educated to elevate while lying in supine. She reports that she stays in recliner, but RLE with severe edema.    Time 2    Period Weeks    Status On-going               PT Long Term Goals - 11/09/21 1603       PT LONG TERM GOAL #1   Title MMT to improve by at least 1 grade in all weak groups    Time 6    Period Weeks    Status New    Target Date 12/21/21      PT LONG TERM GOAL #2   Title Will demonstrate R knee extension of no more than 5 degrees, and R knee flexion of at least 110 degrees    Time 6    Period Weeks    Status New      PT LONG TERM GOAL #3   Title Will be able to ascend and descend at least 4 stairs with U rail and step over step pattern without difficulty    Time 6    Period Weeks    Status New      PT LONG TERM GOAL #4   Title Will be able to complete TUG with LRAD in 20 seconds or less to show improved balance and mobility    Time 6    Period Weeks    Status New  Plan - 11/24/21 1150     Clinical Impression Statement Atleigh arrives doing well today; still having pain and has been quite fatigued after PT sessions, I think this will improve as she continues to come. Continued working on basic TKR exercises on the table, we did a lot of self controlled ROM work today as she tends to tense up when I apply over pressure. Progressed gentle seated and standing activities today as well. Will continue to progress  as able and tolerated.    Personal Factors and Comorbidities Fitness;Past/Current Experience;Comorbidity 1;Time since onset of injury/illness/exacerbation    Examination-Activity Limitations Locomotion Level;Transfers;Bed Mobility;Bend;Sit;Sleep;Squat;Stairs;Lift;Stand    Examination-Participation Restrictions Community Activity;Driving;Shop;Laundry;Yard Work    Merchant navy officer Stable/Uncomplicated    Designer, jewellery Low    Rehab Potential Good    PT Frequency 3x / week    PT Duration 6 weeks    PT Treatment/Interventions Cryotherapy;Moist Heat;Vasopneumatic Device;Gait training;Electrical Stimulation;Iontophoresis 4mg /ml Dexamethasone;Therapeutic exercise;Ultrasound;Manual techniques;Neuromuscular re-education;Therapeutic activities;ADLs/Self Care Home Management    PT Next Visit Plan focus on ROM, gait training, strength, balance, edema control    PT Home Exercise Plan still on hospital HEP    Consulted and Agree with Plan of Care Patient             Patient will benefit from skilled therapeutic intervention in order to improve the following deficits and impairments:  Abnormal gait, Decreased range of motion, Difficulty walking, Obesity, Increased muscle spasms, Decreased activity tolerance, Decreased knowledge of precautions, Pain, Decreased balance, Decreased knowledge of use of DME, Decreased scar mobility, Hypomobility, Impaired flexibility, Decreased mobility, Decreased strength, Postural dysfunction, Impaired sensation, Increased edema  Visit Diagnosis: Acute pain of right knee  Localized edema  Difficulty in walking, not elsewhere classified  Stiffness of right knee, not elsewhere classified     Problem List Patient Active Problem List   Diagnosis Date Noted   OA (osteoarthritis) of knee 12/19/2020   Primary osteoarthritis of left knee 12/19/2020   Obstructive sleep apnea 06/19/2019   Malignant neoplasm of overlapping sites of right breast  in female, estrogen receptor positive ([place]) 05/14/2019   Coagulopathy ([place]) 05/14/2019   Morbid obesity ([place]) 05/14/2019   Chronic thromboembolic disease ([place]) 54/65/6812   Pulmonary vascular disease ([place]) 04/08/2019   Hypothyroidism 04/07/2019   Essential hypertension 04/07/2019   Pulmonary embolism ([place]) 04/06/2019   Abnormal liver function 04/06/2019   Hypokalemia 04/06/2019   Incisional hernia 04/06/2016   Ann Lions PT, DPT, PN2   Supplemental Physical Therapist [place]. [place], [place], [address] Phone: [phone]   Fax:  [phone]  Name: Cassandra [name] MRN: [id number] Date of Birth: 02-12-1946

## 2021-11-27 ENCOUNTER — Ambulatory Visit: Payer: Medicare HMO | Admitting: Physical Therapy

## 2021-11-27 ENCOUNTER — Encounter: Payer: Self-pay | Admitting: Physical Therapy

## 2021-11-27 ENCOUNTER — Other Ambulatory Visit: Payer: Self-pay

## 2021-11-27 DIAGNOSIS — R600 Localized edema: Secondary | ICD-10-CM

## 2021-11-27 DIAGNOSIS — M25661 Stiffness of right knee, not elsewhere classified: Secondary | ICD-10-CM | POA: Diagnosis not present

## 2021-11-27 DIAGNOSIS — M25561 Pain in right knee: Secondary | ICD-10-CM | POA: Diagnosis not present

## 2021-11-27 DIAGNOSIS — R262 Difficulty in walking, not elsewhere classified: Secondary | ICD-10-CM | POA: Diagnosis not present

## 2021-11-27 NOTE — Therapy (Signed)
[place]. [place], [place], [address] Phone: [phone]   Fax:  [phone]  Physical Therapy Treatment  Patient Details  Name: Cassandra [name] MRN: [id number] Date of Birth: 12-08-1945 Referring Provider (PT): Aluisio   Encounter Date: 11/27/2021   PT End of Session - 11/27/21 1614     Visit Number 8    Number of Visits 19    Date for PT Re-Evaluation 12/21/21    Authorization Type [employer]    Authorization Time Period 11/09/21 to 12/21/21    Progress Note Due on Visit 10    PT Start Time 3545    PT Stop Time 1612    PT Time Calculation (min) 39 min    Activity Tolerance Patient tolerated treatment well    Behavior During Therapy [facility] for tasks assessed/performed             Past Medical History:  Diagnosis Date   Arthritis    Cancer ([place])    Breast cancer right '06- surgery, chemo, radiation- released after 5 yrs   Dyspnea    in 2020 had pe placed on eliquis   Hx of seasonal allergies    OTC med used   Hypertension    Hypothyroidism    Pulmonary embolism ([place])    hx of in 2020 placed on eliquis   Sleep apnea    cpap    Past Surgical History:  Procedure Laterality Date   BREAST SURGERY Right    modified partial mastectomy/ lymph nodes dissection   COLON SURGERY  2/17   INCISIONAL HERNIA REPAIR N/A 04/06/2016   Procedure: LAPAROSCOPIC ASSISTED INCISIONAL HERNIA REPAIR WITH MESH;  Surgeon: Leighton Ruff, MD;  [facility];  Service: General;  Laterality: N/A;   TONSILLECTOMY     TOTAL KNEE ARTHROPLASTY Left 12/19/2020   Procedure: TOTAL KNEE ARTHROPLASTY;  Surgeon: Gaynelle Arabian, MD;  Location: [facility];  Service: Orthopedics;  Laterality: Left;  89mn   TOTAL KNEE ARTHROPLASTY Right 11/06/2021   Procedure: TOTAL KNEE ARTHROPLASTY;  Surgeon: AGaynelle Arabian MD;  [facility];  Service: Orthopedics;  Laterality: Right;    There were no vitals filed for this visit.   Subjective  Assessment - 11/27/21 1536     Subjective I've been up on my feet a lot today, my knee is complianing more than usual. I was inert after I saw you last time, that was a hard workout. I was sitting watching streaming for hours this weekend and the knee really stiffened    Patient Stated Goals get moving better, get pain down    Currently in Pain? Yes    Pain Score 3     Pain Location Knee    Pain Orientation Right    Pain Descriptors / Indicators Aching    Pain Type Surgical pain                               [facility] Adult PT Treatment/Exercise - 11/27/21 0001       Knee/Hip Exercises: Aerobic   Recumbent Bike bike seat 9x 6 minutes full rotations      Knee/Hip Exercises: Standing   Gait Training 1044fwith heel toe gait training, RW      Knee/Hip Exercises: Seated   Long Arc Quad Right;1 set;15 reps    Long Arc Quad Limitations 3 second holds    Other Seated Knee/Hip Exercises self knee flexion  x15 with 5 second holds    Hamstring Curl --     Hamstring Weights --      Knee/Hip Exercises: Supine   Other Supine Knee/Hip Exercises supine knee extension overpressure 1x10; heel slides with overpressure 1x10    Other Supine Knee/Hip Exercises supine knee ext stretch with towel roll under ankle and 2# on knee x3 minutes      Manual Therapy   Manual Therapy Joint mobilization;Soft tissue mobilization    Joint Mobilization patella mobs all directions    Soft tissue mobilization scar massage                     PT Education - 11/27/21 1613     Education Details heel toe gait pattern, ice at home    Person(s) Educated Patient    Methods Explanation    Comprehension Verbalized understanding              PT Short Term Goals - 11/17/21 1121       PT SHORT TERM GOAL #1   Title Will be independent iwth appropriate progressive HEP    Baseline Patient reports inconsistent performance    Time 2    Period Weeks    Status On-going    Target Date  11/30/21      PT SHORT TERM GOAL #2   Title R knee ROM to be no more than 8 degrees extension and will be at least 100 degrees flexion    Baseline 28-107    Time 2    Period Weeks    Status On-going      PT SHORT TERM GOAL #3   Title Gait pattern will improve to consistent heel-toe, step through pattern with upright posture, even step and stride lengths, and minimal unsteadiness with LRAD    Baseline VC for step through gait pattern.    Time 2    Period Weeks    Status On-going      PT SHORT TERM GOAL #4   Title Will be independent with edema management strategies    Baseline Educated to elevate while lying in supine. She reports that she stays in recliner, but RLE with severe edema.    Time 2    Period Weeks    Status On-going               PT Long Term Goals - 11/09/21 1603       PT LONG TERM GOAL #1   Title MMT to improve by at least 1 grade in all weak groups    Time 6    Period Weeks    Status New    Target Date 12/21/21      PT LONG TERM GOAL #2   Title Will demonstrate R knee extension of no more than 5 degrees, and R knee flexion of at least 110 degrees    Time 6    Period Weeks    Status New      PT LONG TERM GOAL #3   Title Will be able to ascend and descend at least 4 stairs with U rail and step over step pattern without difficulty    Time 6    Period Weeks    Status New      PT LONG TERM GOAL #4   Title Will be able to complete TUG with LRAD in 20 seconds or less to show improved balance and mobility    Time 6    Period Weeks  Status New                   Plan - 11/27/21 1614     Clinical Impression Statement Lavon arrives today doing OK, was pretty tired after our session on Friday and had a busy weekend. Still having pain in her knee, encouraged her to take pain meds as scheduled and call MDs office if it gets too out of hand. We continued working on ROM and general strength today, I think we are making slow but steady progress  for where we are in recovery after this surgery.    Personal Factors and Comorbidities Fitness;Past/Current Experience;Comorbidity 1;Time since onset of injury/illness/exacerbation    Examination-Activity Limitations Locomotion Level;Transfers;Bed Mobility;Bend;Sit;Sleep;Squat;Stairs;Lift;Stand    Examination-Participation Restrictions Community Activity;Driving;Shop;Laundry;Yard Work    Merchant [employer] officer Stable/Uncomplicated    Designer, jewellery Low    Rehab Potential Good    PT Frequency 3x / week    PT Duration 6 weeks    PT Treatment/Interventions Cryotherapy;Moist Heat;Vasopneumatic Device;Gait training;Electrical Stimulation;Iontophoresis '4mg'$ /ml Dexamethasone;Therapeutic exercise;Ultrasound;Manual techniques;Neuromuscular re-education;Therapeutic activities;ADLs/Self Care Home Management    PT Next Visit Plan focus on ROM, gait training, strength, balance, edema control    PT Home Exercise Plan still on hospital HEP    Consulted and Agree with Plan of Care Patient             Patient will benefit from skilled therapeutic intervention in order to improve the following deficits and impairments:  Abnormal gait, Decreased range of motion, Difficulty walking, Obesity, Increased muscle spasms, Decreased activity tolerance, Decreased knowledge of precautions, Pain, Decreased balance, Decreased knowledge of use of DME, Decreased scar mobility, Hypomobility, Impaired flexibility, Decreased mobility, Decreased strength, Postural dysfunction, Impaired sensation, Increased edema  Visit Diagnosis: Acute pain of right knee  Localized edema  Difficulty in walking, not elsewhere classified  Stiffness of right knee, not elsewhere classified     Problem List Patient Active Problem List   Diagnosis Date Noted   OA (osteoarthritis) of knee 12/19/2020   Primary osteoarthritis of left knee 12/19/2020   Obstructive sleep apnea 06/19/2019   Malignant neoplasm of  overlapping sites of right breast in female, estrogen receptor positive ([place]) 05/14/2019   Coagulopathy ([place]) 05/14/2019   Morbid obesity ([place]) 05/14/2019   Chronic thromboembolic disease ([place]) 56/25/6389   Pulmonary vascular disease ([place]) 04/08/2019   Hypothyroidism 04/07/2019   Essential hypertension 04/07/2019   Pulmonary embolism ([place]) 04/06/2019   Abnormal liver function 04/06/2019   Hypokalemia 04/06/2019   Incisional hernia 04/06/2016   Ann Lions PT, DPT, PN2   Supplemental Physical Therapist [place]. [place], [place], [address] Phone: [phone]   Fax:  [phone]  Name: KAYLEI FRINK MRN: [id number] Date of Birth: 1946-02-16

## 2021-11-29 ENCOUNTER — Encounter: Payer: Self-pay | Admitting: Physical Therapy

## 2021-11-29 ENCOUNTER — Other Ambulatory Visit: Payer: Self-pay

## 2021-11-29 ENCOUNTER — Ambulatory Visit: Payer: Medicare HMO | Admitting: Physical Therapy

## 2021-11-29 DIAGNOSIS — R600 Localized edema: Secondary | ICD-10-CM

## 2021-11-29 DIAGNOSIS — M25561 Pain in right knee: Secondary | ICD-10-CM

## 2021-11-29 DIAGNOSIS — M25661 Stiffness of right knee, not elsewhere classified: Secondary | ICD-10-CM

## 2021-11-29 DIAGNOSIS — R262 Difficulty in walking, not elsewhere classified: Secondary | ICD-10-CM

## 2021-11-29 NOTE — Therapy (Signed)
[place]. [place], [place], [address] Phone: [phone]   Fax:  [phone]  Physical Therapy Treatment  Patient Details  Name: Cassandra [name] MRN: [id number] Date of Birth: 1946-08-12 Referring Provider (PT): Aluisio   Encounter Date: 11/29/2021   PT End of Session - 11/29/21 1533     Visit Number 9    Number of Visits 19    Date for PT Re-Evaluation 12/21/21    Authorization Type [employer]    Authorization Time Period 11/09/21 to 12/21/21    Progress Note Due on Visit 10    PT Start Time 1454   arrived late   PT Stop Time 1529    PT Time Calculation (min) 35 min    Activity Tolerance Patient tolerated treatment well    Behavior During Therapy [facility] for tasks assessed/performed             Past Medical History:  Diagnosis Date   Arthritis    Cancer ([place])    Breast cancer right '06- surgery, chemo, radiation- released after 5 yrs   Dyspnea    in 2020 had pe placed on eliquis   Hx of seasonal allergies    OTC med used   Hypertension    Hypothyroidism    Pulmonary embolism ([place])    hx of in 2020 placed on eliquis   Sleep apnea    cpap    Past Surgical History:  Procedure Laterality Date   BREAST SURGERY Right    modified partial mastectomy/ lymph nodes dissection   COLON SURGERY  2/17   INCISIONAL HERNIA REPAIR N/A 04/06/2016   Procedure: LAPAROSCOPIC ASSISTED INCISIONAL HERNIA REPAIR WITH MESH;  Surgeon: Leighton Ruff, MD;  [facility];  Service: General;  Laterality: N/A;   TONSILLECTOMY     TOTAL KNEE ARTHROPLASTY Left 12/19/2020   Procedure: TOTAL KNEE ARTHROPLASTY;  Surgeon: Gaynelle Arabian, MD;  [facility];  Service: Orthopedics;  Laterality: Left;  58mn   TOTAL KNEE ARTHROPLASTY Right 11/06/2021   Procedure: TOTAL KNEE ARTHROPLASTY;  Surgeon: AGaynelle Arabian MD;  [facility];  Service: Orthopedics;  Laterality: Right;    There were no vitals filed for this visit.    Subjective Assessment - 11/29/21 1455     Subjective I feel good today, but I felt a little beat up after the oxy wore off but fine now.    Patient Stated Goals get moving better, get pain down    Currently in Pain? Yes    Pain Score 3     Pain Location Knee    Pain Orientation Right    Pain Descriptors / Indicators Aching    Pain Type Surgical pain                               [facility] Adult PT Treatment/Exercise - 11/29/21 0001       Knee/Hip Exercises: Aerobic   Recumbent Bike bike seat 9x 6 minutes full rotations      Knee/Hip Exercises: Standing   Heel Raises Both;1 set;10 reps    Knee Flexion Right;1 set;10 reps    Other Standing Knee Exercises standing TKE 1x10 min faciltation      Knee/Hip Exercises: Seated   Long Arc Quad Right;1 set;10 reps;Weights    Long Arc Quad Weight 3 lbs.      Knee/Hip Exercises: Supine   Other Supine Knee/Hip Exercises supine knee extensions  with overpressure 1x10, knee flexion overpressure 1x10      Manual Therapy   Manual Therapy Soft tissue mobilization    Joint Mobilization patella mobs all directions    Soft tissue mobilization scar massage, IASTM to R quad                     PT Education - 11/29/21 1532     Education Details POC next session- will need reassessment/progress note    Person(s) Educated Patient    Methods Explanation    Comprehension Verbalized understanding              PT Short Term Goals - 11/17/21 1121       PT SHORT TERM GOAL #1   Title Will be independent iwth appropriate progressive HEP    Baseline Patient reports inconsistent performance    Time 2    Period Weeks    Status On-going    Target Date 11/30/21      PT SHORT TERM GOAL #2   Title R knee ROM to be no more than 8 degrees extension and will be at least 100 degrees flexion    Baseline 28-107    Time 2    Period Weeks    Status On-going      PT SHORT TERM GOAL #3   Title Gait pattern will improve to  consistent heel-toe, step through pattern with upright posture, even step and stride lengths, and minimal unsteadiness with LRAD    Baseline VC for step through gait pattern.    Time 2    Period Weeks    Status On-going      PT SHORT TERM GOAL #4   Title Will be independent with edema management strategies    Baseline Educated to elevate while lying in supine. She reports that she stays in recliner, but RLE with severe edema.    Time 2    Period Weeks    Status On-going               PT Long Term Goals - 11/09/21 1603       PT LONG TERM GOAL #1   Title MMT to improve by at least 1 grade in all weak groups    Time 6    Period Weeks    Status New    Target Date 12/21/21      PT LONG TERM GOAL #2   Title Will demonstrate R knee extension of no more than 5 degrees, and R knee flexion of at least 110 degrees    Time 6    Period Weeks    Status New      PT LONG TERM GOAL #3   Title Will be able to ascend and descend at least 4 stairs with U rail and step over step pattern without difficulty    Time 6    Period Weeks    Status New      PT LONG TERM GOAL #4   Title Will be able to complete TUG with LRAD in 20 seconds or less to show improved balance and mobility    Time 6    Period Weeks    Status New                   Plan - 11/29/21 1533     Clinical Impression Statement Cassandra [name] arrives a little late today, doing well. We continued working on activities and exercises as appropriate at her stage  of recovery of TKR surgery. Still having some pain, I encouraged her to take pain meds as prescribed by her surgeon. Will continue efforts, progress is a little slow but I think she is slowly getting better day by day. Will need progress note next session.    Personal Factors and Comorbidities Fitness;Past/Current Experience;Comorbidity 1;Time since onset of injury/illness/exacerbation    Examination-Activity Limitations Locomotion Level;Transfers;Bed  Mobility;Bend;Sit;Sleep;Squat;Stairs;Lift;Stand    Stability/Clinical Decision Making Stable/Uncomplicated    Clinical Decision Making Low    Rehab Potential Good    PT Frequency 3x / week    PT Duration 6 weeks    PT Treatment/Interventions Cryotherapy;Moist Heat;Vasopneumatic Device;Gait training;Electrical Stimulation;Iontophoresis '4mg'$ /ml Dexamethasone;Therapeutic exercise;Ultrasound;Manual techniques;Neuromuscular re-education;Therapeutic activities;ADLs/Self Care Home Management    PT Next Visit Plan 10th visit note/measures    PT Home Exercise Plan still on hospital HEP    Consulted and Agree with Plan of Care Patient             Patient will benefit from skilled therapeutic intervention in order to improve the following deficits and impairments:  Abnormal gait, Decreased range of motion, Difficulty walking, Obesity, Increased muscle spasms, Decreased activity tolerance, Decreased knowledge of precautions, Pain, Decreased balance, Decreased knowledge of use of DME, Decreased scar mobility, Hypomobility, Impaired flexibility, Decreased mobility, Decreased strength, Postural dysfunction, Impaired sensation, Increased edema  Visit Diagnosis: Acute pain of right knee  Localized edema  Difficulty in walking, not elsewhere classified  Stiffness of right knee, not elsewhere classified     Problem List Patient Active Problem List   Diagnosis Date Noted   OA (osteoarthritis) of knee 12/19/2020   Primary osteoarthritis of left knee 12/19/2020   Obstructive sleep apnea 06/19/2019   Malignant neoplasm of overlapping sites of right breast in female, estrogen receptor positive ([place]) 05/14/2019   Coagulopathy ([place]) 05/14/2019   Morbid obesity ([place]) 05/14/2019   Chronic thromboembolic disease ([place]) 38/45/3646   Pulmonary vascular disease ([place]) 04/08/2019   Hypothyroidism 04/07/2019   Essential hypertension 04/07/2019   Pulmonary embolism ([place]) 04/06/2019   Abnormal liver function  04/06/2019   Hypokalemia 04/06/2019   Incisional hernia 04/06/2016   Ann Lions PT, DPT, PN2   Supplemental Physical Therapist [place]. [place], [place], [address] Phone: [phone]   Fax:  [phone]  Name: NASIRAH SACHS MRN: [id number] Date of Birth: [birth year]

## 2021-12-01 ENCOUNTER — Ambulatory Visit: Payer: Medicare HMO | Admitting: Physical Therapy

## 2021-12-01 ENCOUNTER — Other Ambulatory Visit: Payer: Self-pay

## 2021-12-01 DIAGNOSIS — R600 Localized edema: Secondary | ICD-10-CM | POA: Diagnosis not present

## 2021-12-01 DIAGNOSIS — R262 Difficulty in walking, not elsewhere classified: Secondary | ICD-10-CM

## 2021-12-01 DIAGNOSIS — M25561 Pain in right knee: Secondary | ICD-10-CM | POA: Diagnosis not present

## 2021-12-01 DIAGNOSIS — M25661 Stiffness of right knee, not elsewhere classified: Secondary | ICD-10-CM | POA: Diagnosis not present

## 2021-12-01 NOTE — Therapy (Signed)
[place] ?[place] ?[address] ?[place], [place], [address] ?Phone: [phone]   Fax:  [phone] ? ?Physical Therapy Treatment ? ?Patient Details  ?Name: Cassandra [name] ?MRN: [id number] ?Date of Birth: [birth year] ?Referring Provider (PT): Aluisio ? ? ?Encounter Date: 12/01/2021 ? ? PT End of Session - 12/01/21 1042   ? ? Visit Number 10   ? Number of Visits 19   ? Date for PT Re-Evaluation 12/21/21   ? Authorization Type [employer]   ? Authorization Time Period 11/09/21 to 12/21/21   ? PT Start Time 1015   ? PT Stop Time 1100   ? PT Time Calculation (min) 45 min   ? ?  ?  ? ?  ? ? ?Past Medical History:  ?Diagnosis Date  ? Arthritis   ? Cancer [facility])   ? Breast cancer right '06- surgery, chemo, radiation- released after 5 yrs  ? Dyspnea   ? in 2020 had pe placed on eliquis  ? Hx of seasonal allergies   ? OTC med used  ? Hypertension   ? Hypothyroidism   ? Pulmonary embolism ([place])   ? hx of in 2020 placed on eliquis  ? Sleep apnea   ? cpap  ? ? ?Past Surgical History:  ?Procedure Laterality Date  ? BREAST SURGERY Right   ? modified partial mastectomy/ lymph nodes dissection  ? COLON SURGERY  2/17  ? INCISIONAL HERNIA REPAIR N/A 04/06/2016  ? Procedure: LAPAROSCOPIC ASSISTED INCISIONAL HERNIA REPAIR WITH MESH;  Surgeon: Leighton Ruff, MD;  [facility];  Service: General;  Laterality: N/A;  ? TONSILLECTOMY    ? TOTAL KNEE ARTHROPLASTY Left 12/19/2020  ? Procedure: TOTAL KNEE ARTHROPLASTY;  Surgeon: Gaynelle Arabian, MD;  [facility];  Service: Orthopedics;  Laterality: Left;  63mn  ? TOTAL KNEE ARTHROPLASTY Right 11/06/2021  ? Procedure: TOTAL KNEE ARTHROPLASTY;  Surgeon: AGaynelle Arabian MD;  [facility];  Service: Orthopedics;  Laterality: Right;  ? ? ?There were no vitals filed for this visit. ? ? Subjective Assessment - 12/01/21 1017   ? ? Subjective " pain depends on where I am in the pain cycle" ambs in with good step through gait with RW   ? Currently in  Pain? Yes   ? Pain Score 2    ? Pain Location Knee   ? Pain Orientation Right   ? ?  ?  ? ?  ? ? ? ? ? [facility] PT Assessment - 12/01/21 0001   ? ?  ? AROM  ? Right Knee Extension 7   ? Right Knee Flexion 110   ?  ? Strength  ? Right Hip Flexion 4-/5   ? Right Knee Flexion 4/5   ? Right Knee Extension 4/5   ? ?  ?  ? ?  ? ? ? ? ? ? ? ? ? ? ? ? ? ? ? ? [facility]Ex[facility] - 12/01/21 0001   ? ?  ? Ambulation/Gait  ? Gait Comments amb with  SPC 75 feet with good step thru and cadeance. amb 75 feet with AD step thru alittle slower and antalgic with short RT stance time   ?  ? Knee/Hip Exercises: Aerobic  ? Nustep L 5  6 min   ?  ? Knee/Hip Exercises: Machines for Strengthening  ? Cybex Knee Extension 10# BIL 10x, up with 2 ecc RT 5# 10 x ( unable to lift SL)   ? Cybex Knee  Flexion 20# 10 x BIL, RT LE 10 x 15#   ? Cybex Leg Press 30# 2 set s10   ?  ? Knee/Hip Exercises: Standing  ? Lateral Step Up Right;10 reps;Hand Hold: 2;Step Height: 6"   ? Forward Step Up Right;10 reps;Hand Hold: 2;Step Height: 6"   ? Other Standing Knee Exercises with RW 3# marching, hip flex,ext,abd and HS curl   ?  ? Knee/Hip Exercises: Seated  ? Long [employer] Strengthening;Right;2 sets;10 reps   ? Long Arc Quad Weight 3 lbs.   ? Long [employer] Limitations 3 second holds   ? Other Seated Knee/Hip Exercises TKE green tband 2 sets 10   ? Sit to Sand 10 reps;without UE support   wt ball press  ? ?  ?  ? ?  ? ? ? ? ? ? ? ? ? ? ? ? PT Short Term Goals - 11/17/21 1121   ? ?  ? PT SHORT TERM GOAL #1  ? Title Will be independent iwth appropriate progressive HEP   ? Baseline Patient reports inconsistent performance   ? Time 2   ? Period Weeks   ? Status On-going   ? Target Date 11/30/21   ?  ? PT SHORT TERM GOAL #2  ? Title R knee ROM to be no more than 8 degrees extension and will be at least 100 degrees flexion   ? Baseline 28-107   ? Time 2   ? Period Weeks   ? Status On-going   ?  ? PT SHORT TERM GOAL #3  ? Title Gait pattern will improve to  consistent heel-toe, step through pattern with upright posture, even step and stride lengths, and minimal unsteadiness with LRAD   ? Baseline VC for step through gait pattern.   ? Time 2   ? Period Weeks   ? Status On-going   ?  ? PT SHORT TERM GOAL #4  ? Title Will be independent with edema management strategies   ? Baseline Educated to elevate while lying in supine. She reports that she stays in recliner, but RLE with severe edema.   ? Time 2   ? Period Weeks   ? Status On-going   ? ?  ?  ? ?  ? ? ? ? PT Long Term Goals - 12/01/21 1037   ? ?  ? PT LONG TERM GOAL #1  ? Title MMT to improve by at least 1 grade in all weak groups   ? Status Partially Met   ?  ? PT LONG TERM GOAL #2  ? Title Will demonstrate R knee extension of no more than 5 degrees, and R knee flexion of at least 110 degrees   ? Status Partially Met   ?  ? PT LONG TERM GOAL #3  ? Title Will be able to ascend and descend at least 4 stairs with U rail and step over step pattern without difficulty   ? Status On-going   ?  ? PT LONG TERM GOAL #4  ? Title Will be able to complete TUG with LRAD in 20 seconds or less to show improved balance and mobility   ? Status Achieved   ? ?  ?  ? ?  ? ? ? ? ? ? ? ? Plan - 12/01/21 1039   ? ? Clinical Impression Statement checked ROM and goals for 10th visit. progressing well with goals. wored on progressing gait and ther ex with cuing for quad actviation  with ex. added machine interventions   ? PT Treatment/Interventions Cryotherapy;Moist Heat;Vasopneumatic Device;Gait training;Electrical Stimulation;Iontophoresis 73m/ml Dexamethasone;Therapeutic exercise;Ultrasound;Manual techniques;Neuromuscular re-education;Therapeutic activities;ADLs/Self Care Home Management   ? PT Next Visit Plan MD 21st. progress withquad/ TKE and gait without AD. balance   ? ?  ?  ? ?  ? ? ?Patient will benefit from skilled therapeutic intervention in order to improve the following deficits and impairments:  Abnormal gait, Decreased range of  motion, Difficulty walking, Obesity, Increased muscle spasms, Decreased activity tolerance, Decreased knowledge of precautions, Pain, Decreased balance, Decreased knowledge of use of DME, Decreased scar mobility, Hypomobility, Impaired flexibility, Decreased mobility, Decreased strength, Postural dysfunction, Impaired sensation, Increased edema ? ?Visit Diagnosis: ?Acute pain of right knee ? ?Difficulty in walking, not elsewhere classified ? ?Stiffness of right knee, not elsewhere classified ? ? ? ? ?Problem List ?Patient Active Problem List  ? Diagnosis Date Noted  ? OA (osteoarthritis) of knee 12/19/2020  ? Primary osteoarthritis of left knee 12/19/2020  ? Obstructive sleep apnea 06/19/2019  ? Malignant neoplasm of overlapping sites of right breast in female, estrogen receptor positive ([place] 05/14/2019  ? Coagulopathy ([place] 05/14/2019  ? Morbid obesity ([place] 05/14/2019  ? Chronic thromboembolic disease ([place] 000/34/9611 ? Pulmonary vascular disease ([place] 04/08/2019  ? Hypothyroidism 04/07/2019  ? Essential hypertension 04/07/2019  ? Pulmonary embolism ([place] 04/06/2019  ? Abnormal liver function 04/06/2019  ? Hypokalemia 04/06/2019  ? Incisional hernia 04/06/2016  ? ? ?Boden Stucky,ANGIE, PTA ?12/01/2021, 10:53 AM ? ?[place] ?[place] ?[place] [address]?Phone: [phone]  Fax:  [phone]? ?Name: Cassandra [name]?MRN: [id number]?Date of Birth: 812/01/1946? ? ? ?

## 2021-12-04 ENCOUNTER — Encounter: Payer: Self-pay | Admitting: Physical Therapy

## 2021-12-04 ENCOUNTER — Other Ambulatory Visit: Payer: Self-pay

## 2021-12-04 ENCOUNTER — Ambulatory Visit: Payer: Medicare HMO | Admitting: Physical Therapy

## 2021-12-04 DIAGNOSIS — M25661 Stiffness of right knee, not elsewhere classified: Secondary | ICD-10-CM | POA: Diagnosis not present

## 2021-12-04 DIAGNOSIS — R262 Difficulty in walking, not elsewhere classified: Secondary | ICD-10-CM | POA: Diagnosis not present

## 2021-12-04 DIAGNOSIS — R600 Localized edema: Secondary | ICD-10-CM | POA: Diagnosis not present

## 2021-12-04 DIAGNOSIS — M25561 Pain in right knee: Secondary | ICD-10-CM

## 2021-12-04 NOTE — Therapy (Signed)
[place]. [place], [place], [address] Phone: [phone]   Fax:  [phone]  Physical Therapy Treatment  Patient Details  Name: Cassandra [name] MRN: [id number] Date of Birth: 1946-03-28 Referring Provider (PT): Aluisio   Encounter Date: 12/04/2021   PT End of Session - 12/04/21 1528     Visit Number 11    Number of Visits 19    Date for PT Re-Evaluation 12/21/21    Authorization Type Humana    Authorization Time Period 11/09/21 to 12/21/21    Progress Note Due on Visit 20    PT Start Time 1446    PT Stop Time 1527    PT Time Calculation (min) 41 min    Activity Tolerance Patient tolerated treatment well    Behavior During Therapy [facility] for tasks assessed/performed             Past Medical History:  Diagnosis Date   Arthritis    Cancer ([place])    Breast cancer right '06- surgery, chemo, radiation- released after 5 yrs   Dyspnea    in 2020 had pe placed on eliquis   Hx of seasonal allergies    OTC med used   Hypertension    Hypothyroidism    Pulmonary embolism ([place])    hx of in 2020 placed on eliquis   Sleep apnea    cpap    Past Surgical History:  Procedure Laterality Date   BREAST SURGERY Right    modified partial mastectomy/ lymph nodes dissection   COLON SURGERY  2/17   INCISIONAL HERNIA REPAIR N/A 04/06/2016   Procedure: LAPAROSCOPIC ASSISTED INCISIONAL HERNIA REPAIR WITH MESH;  Surgeon: Leighton Ruff, MD;  [facility];  Service: General;  Laterality: N/A;   TONSILLECTOMY     TOTAL KNEE ARTHROPLASTY Left 12/19/2020   Procedure: TOTAL KNEE ARTHROPLASTY;  Surgeon: Gaynelle Arabian, MD;  [facility]: [facility];  Service: Orthopedics;  Laterality: Left;  74mn   TOTAL KNEE ARTHROPLASTY Right 11/06/2021   Procedure: TOTAL KNEE ARTHROPLASTY;  Surgeon: AGaynelle Arabian MD;  [facility];  Service: Orthopedics;  Laterality: Right;    There were no vitals filed for this visit.   Subjective  Assessment - 12/04/21 1453     Subjective Doing well, I like the cane, its easier for me to walk around with it than the walker. Had a lot of pain this weekend.    Patient Stated Goals get moving better, get pain down    Currently in Pain? Yes    Pain Score 3     Pain Location Knee    Pain Orientation Right                               [facility] Adult PT Treatment/Exercise - 12/04/21 0001       Knee/Hip Exercises: Stretches   Active Hamstring Stretch Right   with quad sets 10x3 second holds   Gastroc Stretch Right;20 seconds    Other Knee/Hip Stretches standing R knee flexion stretch 5 second holds x10 8 inch step      Knee/Hip Exercises: Aerobic   Recumbent Bike bike seat 9 x6 minutes full rotations      Knee/Hip Exercises: Standing   Lateral Step Up Right;1 set;15 reps;Step Height: 4";Hand Hold: 1    Forward Step Up Right;1 set;15 reps;Hand Hold: 1;Step Height: 4"    Step Down Right;1 set;10 reps;Hand Hold: 2  from blue foam pad   Other Standing Knee Exercises 3# ankle weights: 1x20 marches, hip ABD 1x10 B, hip extension 1x10 B      Knee/Hip Exercises: Seated   Long Arc Quad Both;1 set;Weights;2 sets    Long Arc Quad Weight 4 lbs.                     PT Education - 12/04/21 1528     Education Details exercise form and purpose    Person(s) Educated Patient    Methods Explanation    Comprehension Verbalized understanding              PT Short Term Goals - 11/17/21 1121       PT SHORT TERM GOAL #1   Title Will be independent iwth appropriate progressive HEP    Baseline Patient reports inconsistent performance    Time 2    Period Weeks    Status On-going    Target Date 11/30/21      PT SHORT TERM GOAL #2   Title R knee ROM to be no more than 8 degrees extension and will be at least 100 degrees flexion    Baseline 28-107    Time 2    Period Weeks    Status On-going      PT SHORT TERM GOAL #3   Title Gait pattern will improve  to consistent heel-toe, step through pattern with upright posture, even step and stride lengths, and minimal unsteadiness with LRAD    Baseline VC for step through gait pattern.    Time 2    Period Weeks    Status On-going      PT SHORT TERM GOAL #4   Title Will be independent with edema management strategies    Baseline Educated to elevate while lying in supine. She reports that she stays in recliner, but RLE with severe edema.    Time 2    Period Weeks    Status On-going               PT Long Term Goals - 12/01/21 1037       PT LONG TERM GOAL #1   Title MMT to improve by at least 1 grade in all weak groups    Status Partially Met      PT LONG TERM GOAL #2   Title Will demonstrate R knee extension of no more than 5 degrees, and R knee flexion of at least 110 degrees    Status Partially Met      PT LONG TERM GOAL #3   Title Will be able to ascend and descend at least 4 stairs with U rail and step over step pattern without difficulty    Status On-going      PT LONG TERM GOAL #4   Title Will be able to complete TUG with LRAD in 20 seconds or less to show improved balance and mobility    Status Achieved                   Plan - 12/04/21 1528     Clinical Impression Statement Pattricia arrives doing OK today, had a lot of pain over the weekend but feeling better now. Continued progressing all activities as able and appropriate. Seems to still be making slow but steady progress at this point, will continue efforts.    Personal Factors and Comorbidities Fitness;Past/Current Experience;Comorbidity 1;Time since onset of injury/illness/exacerbation    Examination-Activity Limitations Locomotion Level;Transfers;Bed  Mobility;Bend;Sit;Sleep;Squat;Stairs;Lift;Stand    Examination-Participation Restrictions Community Activity;Driving;Shop;Laundry;Yard Work    Stability/Clinical Decision Making Stable/Uncomplicated    Clinical Decision Making Low    Rehab Potential Good    PT  Duration 6 weeks    PT Treatment/Interventions Cryotherapy;Moist Heat;Vasopneumatic Device;Gait training;Electrical Stimulation;Iontophoresis 12m/ml Dexamethasone;Therapeutic exercise;Ultrasound;Manual techniques;Neuromuscular re-education;Therapeutic activities;ADLs/Self Care Home Management    PT Next Visit Plan MD 21st. progress withquad/ TKE and gait without AD. balance    PT Home Exercise Plan still on hospital HEP    Consulted and Agree with Plan of Care Patient             Patient will benefit from skilled therapeutic intervention in order to improve the following deficits and impairments:  Abnormal gait, Decreased range of motion, Difficulty walking, Obesity, Increased muscle spasms, Decreased activity tolerance, Decreased knowledge of precautions, Pain, Decreased balance, Decreased knowledge of use of DME, Decreased scar mobility, Hypomobility, Impaired flexibility, Decreased mobility, Decreased strength, Postural dysfunction, Impaired sensation, Increased edema  Visit Diagnosis: Acute pain of right knee  Stiffness of right knee, not elsewhere classified  Localized edema  Difficulty in walking, not elsewhere classified     Problem List Patient Active Problem List   Diagnosis Date Noted   OA (osteoarthritis) of knee 12/19/2020   Primary osteoarthritis of left knee 12/19/2020   Obstructive sleep apnea 06/19/2019   Malignant neoplasm of overlapping sites of right breast in female, estrogen receptor positive ([place] 05/14/2019   Coagulopathy ([facility] 05/14/2019   Morbid obesity ([place] 05/14/2019   Chronic thromboembolic disease ([facility] 027/78/2423  Pulmonary vascular disease ([facility] 04/08/2019   Hypothyroidism 04/07/2019   Essential hypertension 04/07/2019   Pulmonary embolism ([place] 04/06/2019   Abnormal liver function 04/06/2019   Hypokalemia 04/06/2019   Incisional hernia 04/06/2016   KAnn LionsPT, DPT, PN2   Supplemental Physical Therapist [place] [address]Phone: [phone]  Fax:  [phone] Name: Cassandra FRANCAMRN: [id number] of Birth: 819-Jun-1947

## 2021-12-06 ENCOUNTER — Ambulatory Visit: Payer: Medicare HMO | Admitting: Physical Therapy

## 2021-12-06 ENCOUNTER — Encounter: Payer: Self-pay | Admitting: Physical Therapy

## 2021-12-06 ENCOUNTER — Other Ambulatory Visit: Payer: Self-pay

## 2021-12-06 DIAGNOSIS — M25661 Stiffness of right knee, not elsewhere classified: Secondary | ICD-10-CM | POA: Diagnosis not present

## 2021-12-06 DIAGNOSIS — M25561 Pain in right knee: Secondary | ICD-10-CM | POA: Diagnosis not present

## 2021-12-06 DIAGNOSIS — R600 Localized edema: Secondary | ICD-10-CM | POA: Diagnosis not present

## 2021-12-06 DIAGNOSIS — R262 Difficulty in walking, not elsewhere classified: Secondary | ICD-10-CM

## 2021-12-06 NOTE — Therapy (Signed)
[place] ?[place] ?[address] ?[place], [place], [address] ?Phone: [phone]   Fax:  [phone] ? ?Physical Therapy Treatment ? ?Patient Details  ?Name: Cassandra [name] ?MRN: [id number] ?Date of Birth: [birth year] ?Referring Provider (PT): Aluisio ? ? ?Encounter Date: 12/06/2021 ? ? PT End of Session - 12/06/21 1541   ? ? Visit Number 12   ? Number of Visits 19   ? Date for PT Re-Evaluation 12/21/21   ? Authorization Type Hum[employer]   ? Authorization Time Period 11/09/21 to 12/21/21   ? Progress Note Due on Visit 20   ? PT Start Time 1450   ? PT Stop Time 1528   ? PT Time Calculation (min) 38 min   ? Activity Tolerance Patient tolerated treatment well   ? Behavior During Therapy [facility] for tasks assessed/performed   ? ?  ?  ? ?  ? ? ?Past Medical History:  ?Diagnosis Date  ? Arthritis   ? Cancer [facility])   ? Breast cancer right '06- surgery, chemo, radiation- released after 5 yrs  ? Dyspnea   ? in 2020 had pe placed on eliquis  ? Hx of seasonal allergies   ? OTC med used  ? Hypertension   ? Hypothyroidism   ? Pulmonary embolism ([place])   ? hx of in 2020 placed on eliquis  ? Sleep apnea   ? cpap  ? ? ?Past Surgical History:  ?Procedure Laterality Date  ? BREAST SURGERY Right   ? modified partial mastectomy/ lymph nodes dissection  ? COLON SURGERY  2/17  ? INCISIONAL HERNIA REPAIR N/A 04/06/2016  ? Procedure: LAPAROSCOPIC ASSISTED INCISIONAL HERNIA REPAIR WITH MESH;  Surgeon: Leighton Ruff, MD;  [facility];  Service: General;  Laterality: N/A;  ? TONSILLECTOMY    ? TOTAL KNEE ARTHROPLASTY Left 12/19/2020  ? Procedure: TOTAL KNEE ARTHROPLASTY;  Surgeon: Gaynelle Arabian, MD;  [facility];  Service: Orthopedics;  Laterality: Left;  58mn  ? TOTAL KNEE ARTHROPLASTY Right 11/06/2021  ? Procedure: TOTAL KNEE ARTHROPLASTY;  Surgeon: AGaynelle Arabian MD;  [facility];  Service: Orthopedics;  Laterality: Right;  ? ? ?There were no vitals filed for this visit. ? ? Subjective  Assessment - 12/06/21 1452   ? ? Subjective I had a rough day yesterday, not sure why, feeling better today. I was very sore after the workout we did.   ? Patient Stated Goals get moving better, get pain down   ? Currently in Pain? Yes   ? Pain Score 3    ? Pain Location Knee   ? Pain Orientation Right   ? Pain Descriptors / Indicators Aching;Discomfort   ? ?  ?  ? ?  ? ? ? ? ? ? ? ? ? ? ? ? ? ? ? ? ? ? ? ? [facility] Adult PT Treatment/Exercise - 12/06/21 0001   ? ?  ? Knee/Hip Exercises: Stretches  ? Active Hamstring Stretch Right   ? Active Hamstring Stretch Limitations quad sets 3 second holds x10   ? Other Knee/Hip Stretches standing R knee flexion stretch 5 second holds x10 8 inch step   ?  ? Knee/Hip Exercises: Aerobic  ? Recumbent Bike bike seat 9 x6 minutes full rotations   ?  ? Knee/Hip Exercises: Standing  ? Forward Step Up Right;1 set;10 reps;Hand Hold: 1;Step Height: 6"   ?  ? Knee/Hip Exercises: Seated  ? Long ACSX CorporationRight;1 set;Strengthening;15 reps;Weights   ? Long  Arc Quad Weight 5 lbs.   ? Long [employer] Limitations 3 second holds   ? Sit to Sand 2 sets;10 reps;without UE support   with 2 inch box under L LE for increased load on the R LE  ?  ? Manual Therapy  ? Manual Therapy Soft tissue mobilization   ? Soft tissue mobilization IASTM R quad   ? ?  ?  ? ?  ? ? ? ? ? ? ? ? ? ? PT Education - 12/06/21 1540   ? ? Education Details exercise form/purpose   ? Person(s) Educated Patient   ? Methods Explanation   ? Comprehension Verbalized understanding   ? ?  ?  ? ?  ? ? ? PT Short Term Goals - 11/17/21 1121   ? ?  ? PT SHORT TERM GOAL #1  ? Title Will be independent iwth appropriate progressive HEP   ? Baseline Patient reports inconsistent performance   ? Time 2   ? Period Weeks   ? Status On-going   ? Target Date 11/30/21   ?  ? PT SHORT TERM GOAL #2  ? Title R knee ROM to be no more than 8 degrees extension and will be at least 100 degrees flexion   ? Baseline 28-107   ? Time 2   ? Period Weeks   ? Status  On-going   ?  ? PT SHORT TERM GOAL #3  ? Title Gait pattern will improve to consistent heel-toe, step through pattern with upright posture, even step and stride lengths, and minimal unsteadiness with LRAD   ? Baseline VC for step through gait pattern.   ? Time 2   ? Period Weeks   ? Status On-going   ?  ? PT SHORT TERM GOAL #4  ? Title Will be independent with edema management strategies   ? Baseline Educated to elevate while lying in supine. She reports that she stays in recliner, but RLE with severe edema.   ? Time 2   ? Period Weeks   ? Status On-going   ? ?  ?  ? ?  ? ? ? ? PT Long Term Goals - 12/01/21 1037   ? ?  ? PT LONG TERM GOAL #1  ? Title MMT to improve by at least 1 grade in all weak groups   ? Status Partially Met   ?  ? PT LONG TERM GOAL #2  ? Title Will demonstrate R knee extension of no more than 5 degrees, and R knee flexion of at least 110 degrees   ? Status Partially Met   ?  ? PT LONG TERM GOAL #3  ? Title Will be able to ascend and descend at least 4 stairs with U rail and step over step pattern without difficulty   ? Status On-going   ?  ? PT LONG TERM GOAL #4  ? Title Will be able to complete TUG with LRAD in 20 seconds or less to show improved balance and mobility   ? Status Achieved   ? ?  ?  ? ?  ? ? ? ? ? ? ? ? Plan - 12/06/21 1545   ? ? Clinical Impression Statement Cassandra [name] arrives today doing OK, had a bit of a rough day yesterday but feeling better now. We continued progressing all exercises and activities as tolerated today, kept trying to move forward with ROM and strength. Will continue efforts. She is making progress but I do suspect  that we will ultimately likely need to extend her cert.   ? Personal Factors and Comorbidities Fitness;Past/Current Experience;Comorbidity 1;Time since onset of injury/illness/exacerbation   ? Examination-Activity Limitations Locomotion Level;Transfers;Bed Mobility;Bend;Sit;Sleep;Squat;Stairs;Lift;Stand   ? Examination-Participation Restrictions  Community Activity;Driving;Shop;Laundry;[place] [employer] Work   ? Stability/Clinical Decision Making Stable/Uncomplicated   ? Clinical Decision Making Low   ? Rehab Potential Good   ? PT Frequency 3x / week   ? PT Duration 6 weeks   ? PT Treatment/Interventions Cryotherapy;Moist Heat;Vasopneumatic Device;Gait training;Electrical Stimulation;Iontophoresis 39m/ml Dexamethasone;Therapeutic exercise;Ultrasound;Manual techniques;Neuromuscular re-education;Therapeutic activities;ADLs/Self Care Home Management   ? PT Next Visit Plan MD 21st. progress withquad/ TKE and gait without AD. balance   ? PT Home Exercise Plan still on hospital HEP   ? Consulted and Agree with Plan of Care Patient   ? ?  ?  ? ?  ? ? ?Patient will benefit from skilled therapeutic intervention in order to improve the following deficits and impairments:  Abnormal gait, Decreased range of motion, Difficulty walking, Obesity, Increased muscle spasms, Decreased activity tolerance, Decreased knowledge of precautions, Pain, Decreased balance, Decreased knowledge of use of DME, Decreased scar mobility, Hypomobility, Impaired flexibility, Decreased mobility, Decreased strength, Postural dysfunction, Impaired sensation, Increased edema ? ?Visit Diagnosis: ?Acute pain of right knee ? ?Stiffness of right knee, not elsewhere classified ? ?Localized edema ? ?Difficulty in walking, not elsewhere classified ? ? ? ? ?Problem List ?Patient Active Problem List  ? Diagnosis Date Noted  ? OA (osteoarthritis) of knee 12/19/2020  ? Primary osteoarthritis of left knee 12/19/2020  ? Obstructive sleep apnea 06/19/2019  ? Malignant neoplasm of overlapping sites of right breast in female, estrogen receptor positive ([facility] 05/14/2019  ? Coagulopathy ([facility] [place] 05/14/2019  ? Morbid obesity ([facility] 05/14/2019  ? Chronic thromboembolic disease ([facility] 070/48/8891 ? Pulmonary vascular disease ([facility] 04/08/2019  ? Hypothyroidism 04/07/2019  ? Essential hypertension 04/07/2019  ? Pulmonary embolism ([facility]  04/06/2019  ? Abnormal liver function 04/06/2019  ? Hypokalemia 04/06/2019  ? Incisional hernia 04/06/2016  ? ?Jenesa Foresta U PT, DPT, PN2  ? ?Supplemental Physical Therapist ?[facility] ? ? ? ? ? ?[place] ?Outpat

## 2021-12-08 ENCOUNTER — Ambulatory Visit: Payer: Medicare HMO | Admitting: Physical Therapy

## 2021-12-08 ENCOUNTER — Other Ambulatory Visit: Payer: Self-pay

## 2021-12-08 DIAGNOSIS — M25561 Pain in right knee: Secondary | ICD-10-CM | POA: Diagnosis not present

## 2021-12-08 DIAGNOSIS — R600 Localized edema: Secondary | ICD-10-CM | POA: Diagnosis not present

## 2021-12-08 DIAGNOSIS — R262 Difficulty in walking, not elsewhere classified: Secondary | ICD-10-CM | POA: Diagnosis not present

## 2021-12-08 DIAGNOSIS — M25661 Stiffness of right knee, not elsewhere classified: Secondary | ICD-10-CM | POA: Diagnosis not present

## 2021-12-08 NOTE — Therapy (Signed)
[facility] ?[place] ?[address] ?[place], [place], [address] ?Phone: [phone]   Fax:  [phone] ? ?Physical Therapy Treatment ? ?Patient Details  ?Name: Cassandra [name] ?MRN: [id number] ?Date of Birth: Feb 11, 1946 ?Referring Provider (PT): Aluisio ? ? ?Encounter Date: 12/08/2021 ? ? PT End of Session - 12/08/21 1050   ? ? Visit Number 13   ? Number of Visits 19   ? Date for PT Re-Evaluation 12/21/21   ? Authorization Type Hum[employer]   ? Authorization Time Period 11/09/21 to 12/21/21   ? PT Start Time 1015   ? PT Stop Time 1055   ? PT Time Calculation (min) 40 min   ? ?  ?  ? ?  ? ? ?Past Medical History:  ?Diagnosis Date  ? Arthritis   ? Cancer [facility])   ? Breast cancer right '06- surgery, chemo, radiation- released after 5 yrs  ? Dyspnea   ? in 2020 had pe placed on eliquis  ? Hx of seasonal allergies   ? OTC med used  ? Hypertension   ? Hypothyroidism   ? Pulmonary embolism ([place])   ? hx of in 2020 placed on eliquis  ? Sleep apnea   ? cpap  ? ? ?Past Surgical History:  ?Procedure Laterality Date  ? BREAST SURGERY Right   ? modified partial mastectomy/ lymph nodes dissection  ? COLON SURGERY  2/17  ? INCISIONAL HERNIA REPAIR N/A 04/06/2016  ? Procedure: LAPAROSCOPIC ASSISTED INCISIONAL HERNIA REPAIR WITH MESH;  Surgeon: Leighton Ruff, MD;  [facility];  Service: General;  Laterality: N/A;  ? TONSILLECTOMY    ? TOTAL KNEE ARTHROPLASTY Left 12/19/2020  ? Procedure: TOTAL KNEE ARTHROPLASTY;  Surgeon: Gaynelle Arabian, MD;  [facility];  Service: Orthopedics;  Laterality: Left;  43mn  ? TOTAL KNEE ARTHROPLASTY Right 11/06/2021  ? Procedure: TOTAL KNEE ARTHROPLASTY;  Surgeon: AGaynelle Arabian MD;  [facility];  Service: Orthopedics;  Laterality: Right;  ? ? ?There were no vitals filed for this visit. ? ? Subjective Assessment - 12/08/21 1016   ? ? Subjective I dont remember so much pain last knee, woke up with lateral shooting pain. toothache pain. Seeing MD tuesday. I  think I need to decrease PT to 2 x a week. Amb in with SPC   ? Currently in Pain? Yes   ? Pain Score 4    ? Pain Location Knee   ? Pain Orientation Right   ? ?  ?  ? ?  ? ? ? ? ? [facility] PT Assessment - 12/08/21 0001   ? ?  ? AROM  ? AROM Assessment Site Knee   ? Right/Left Knee Right   ? Right Knee Extension 6   ? Right Knee Flexion 110   ?  ? Strength  ? Right Knee Flexion 4/5   ? Right Knee Extension 4/5   ? ?  ?  ? ?  ? ? ? ? ? ? ? ? ? ? ? ? ? ? ? ? [facility] Treatment/Exercise - 12/08/21 0001   ? ?  ? Knee/Hip Exercises: Aerobic  ? Recumbent Bike bike seat 8 x6 minutes full rotations   ?  ? Knee/Hip Exercises: Machines for Strengthening  ? Cybex Knee Extension 10# BIL 10x, RT SL 2 sets 5 5#   ? Cybex Knee Flexion 20# 12x BIL, RT LE 10 x 15#   ?  ? Knee/Hip Exercises: Standing  ? Heel Raises  15 reps;Both   black bar  ? Knee Flexion Right;15 reps   5# with SPC  ? Lateral Step Up Right;10 reps;Hand Hold: 2;Step Height: 4"   ? Forward Step Up Right;1 set;10 reps;Hand Hold: 1;Step Height: 6"   ? Step Down Right;10 reps;Hand Hold: 2;Step Height: 4"   ? Functional Squat 15 reps   focus on TKE into blue tband  ? Walking with Sports Cord 30# backward with focus on TKE 6 x   ? Other Standing Knee Exercises 5# 8 inch step tap 20 x with SPC   ?  ? Knee/Hip Exercises: Seated  ? Long Arc Quad Right;4 sets;5 reps;Strengthening   ? Long Arc Quad Weight 5 lbs.   ? Long Arc Quad Limitations 3 second holds   ? Sit to Sand 10 reps;without UE support   on airex  ? ?  ?  ? ?  ? ? ? ? ? ? ? ? ? ? ? ? PT Short Term Goals - 11/17/21 1121   ? ?  ? PT SHORT TERM GOAL #1  ? Title Will be independent iwth appropriate progressive HEP   ? Baseline Patient reports inconsistent performance   ? Time 2   ? Period Weeks   ? Status On-going   ? Target Date 11/30/21   ?  ? PT SHORT TERM GOAL #2  ? Title R knee ROM to be no more than 8 degrees extension and will be at least 100 degrees flexion   ? Baseline 28-107   ? Time 2   ? Period Weeks   ?  Status On-going   ?  ? PT SHORT TERM GOAL #3  ? Title Gait pattern will improve to consistent heel-toe, step through pattern with upright posture, even step and stride lengths, and minimal unsteadiness with LRAD   ? Baseline VC for step through gait pattern.   ? Time 2   ? Period Weeks   ? Status On-going   ?  ? PT SHORT TERM GOAL #4  ? Title Will be independent with edema management strategies   ? Baseline Educated to elevate while lying in supine. She reports that she stays in recliner, but RLE with severe edema.   ? Time 2   ? Period Weeks   ? Status On-going   ? ?  ?  ? ?  ? ? ? ? PT Long Term Goals - 12/08/21 1019   ? ?  ? PT LONG TERM GOAL #1  ? Title MMT to improve by at least 1 grade in all weak groups   ? Status Partially Met   ?  ? PT LONG TERM GOAL #2  ? Title Will demonstrate R knee extension of no more than 5 degrees, and R knee flexion of at least 110 degrees   ? Status Partially Met   ? ?  ?  ? ?  ? ? ? ? ? ? ? ? Plan - 12/08/21 1020   ? ? Clinical Impression Statement pt arrived stating 3 x a week was a bit much as she needs more recovery time. comes to PT on pain meds and does well but then sttaes pays for it later that night and next day. ambulated in with [facility]. progressed ther ex   ? PT Treatment/Interventions Cryotherapy;Moist Heat;Vasopneumatic Device;Gait training;Electrical Stimulation;Iontophoresis 4mg/ml Dexamethasone;Therapeutic exercise;Ultrasound;Manual techniques;Neuromuscular re-education;Therapeutic activities;ADLs/Self Care Home Management   ? PT Next Visit Plan check goals. write note for MD appt 3/21   ? ?  ?  ? ?  ? ? ?  Patient will benefit from skilled therapeutic intervention in order to improve the following deficits and impairments:  Abnormal gait, Decreased range of motion, Difficulty walking, Obesity, Increased muscle spasms, Decreased activity tolerance, Decreased knowledge of precautions, Pain, Decreased balance, Decreased knowledge of use of DME, Decreased scar mobility,  Hypomobility, Impaired flexibility, Decreased mobility, Decreased strength, Postural dysfunction, Impaired sensation, Increased edema ? ?Visit Diagnosis: ?Acute pain of right knee ? ?Stiffness of right knee, not elsewhere classified ? ?Difficulty in walking, not elsewhere classified ? ?Localized edema ? ? ? ? ?Problem List ?Patient Active Problem List  ? Diagnosis Date Noted  ? OA (osteoarthritis) of knee 12/19/2020  ? Primary osteoarthritis of left knee 12/19/2020  ? Obstructive sleep apnea 06/19/2019  ? Malignant neoplasm of overlapping sites of right breast in female, estrogen receptor positive ([place]) 05/14/2019  ? Coagulopathy ([place]) 05/14/2019  ? Morbid obesity ([place]) 05/14/2019  ? Chronic thromboembolic disease ([place]) 89/21/1941  ? Pulmonary vascular disease ([place]) 04/08/2019  ? Hypothyroidism 04/07/2019  ? Essential hypertension 04/07/2019  ? Pulmonary embolism ([place]) 04/06/2019  ? Abnormal liver function 04/06/2019  ? Hypokalemia 04/06/2019  ? Incisional hernia 04/06/2016  ? ? ?Doshia Dalia,ANGIE, PTA ?12/08/2021, 10:51 AM ? ?[place] ?[address] ?[address] ?[place], [place], [address] ?Phone: [phone]   Fax:  [phone] ? ?Name: Cassandra [name] ?MRN: [id number] ?Date of Birth: [birth year] ? ? ? ?

## 2021-12-11 ENCOUNTER — Other Ambulatory Visit: Payer: Self-pay

## 2021-12-11 ENCOUNTER — Encounter: Payer: Self-pay | Admitting: Physical Therapy

## 2021-12-11 ENCOUNTER — Ambulatory Visit: Payer: Medicare HMO | Admitting: Physical Therapy

## 2021-12-11 DIAGNOSIS — M25661 Stiffness of right knee, not elsewhere classified: Secondary | ICD-10-CM | POA: Diagnosis not present

## 2021-12-11 DIAGNOSIS — R262 Difficulty in walking, not elsewhere classified: Secondary | ICD-10-CM

## 2021-12-11 DIAGNOSIS — M25561 Pain in right knee: Secondary | ICD-10-CM | POA: Diagnosis not present

## 2021-12-11 DIAGNOSIS — R600 Localized edema: Secondary | ICD-10-CM

## 2021-12-11 NOTE — Therapy (Signed)
[place] [facility] ?[address] ?[place], [place], [address] ?Phone: [phone]   Fax:  [phone] ? ?Physical Therapy Treatment ? ?Patient Details  ?Name: Cassandra [name] ?MRN: [id number] ?Date of Birth: [birth year] ?Referring Provider (PT): Aluisio ? ? ?Encounter Date: 12/11/2021 ? ? PT End of Session - 12/11/21 1527   ? ? Visit Number 14   ? Number of Visits 19   ? Date for PT Re-Evaluation 12/21/21   ? Authorization Type Hum[employer]   ? PT Start Time 1443   ? PT Stop Time 9935   ? PT Time Calculation (min) 44 min   ? Activity Tolerance Patient tolerated treatment well   ? Behavior During Therapy [facility] for tasks assessed/performed   ? ?  ?  ? ?  ? ? ?Past Medical History:  ?Diagnosis Date  ? Arthritis   ? Cancer [facility])   ? Breast cancer right '06- surgery, chemo, radiation- released after 5 yrs  ? Dyspnea   ? in 2020 had pe placed on eliquis  ? Hx of seasonal allergies   ? OTC med used  ? Hypertension   ? Hypothyroidism   ? Pulmonary embolism ([place])   ? hx of in 2020 placed on eliquis  ? Sleep apnea   ? cpap  ? ? ?Past Surgical History:  ?Procedure Laterality Date  ? BREAST SURGERY Right   ? modified partial mastectomy/ lymph nodes dissection  ? COLON SURGERY  2/17  ? INCISIONAL HERNIA REPAIR N/A 04/06/2016  ? Procedure: LAPAROSCOPIC ASSISTED INCISIONAL HERNIA REPAIR WITH MESH;  Surgeon: Leighton Ruff, MD;  [facility]: [facility];  Service: General;  Laterality: N/A;  ? TONSILLECTOMY    ? TOTAL KNEE ARTHROPLASTY Left 12/19/2020  ? Procedure: TOTAL KNEE ARTHROPLASTY;  Surgeon: Gaynelle Arabian, MD;  [facility];  Service: Orthopedics;  Laterality: Left;  1mn  ? TOTAL KNEE ARTHROPLASTY Right 11/06/2021  ? Procedure: TOTAL KNEE ARTHROPLASTY;  Surgeon: AGaynelle Arabian MD;  [facility];  Service: Orthopedics;  Laterality: Right;  ? ? ?There were no vitals filed for this visit. ? ? Subjective Assessment - 12/11/21 1447   ? ? Subjective Patient reports biggest issue is stiffness,  occasional "stabbing pain", difficulty at times getting comfortable to go to sleep   ? Currently in Pain? Yes   ? Pain Score 3    ? Pain Location Knee   ? Pain Orientation Right   ? Pain Descriptors / Indicators Aching;Sore;Tightness   ? Aggravating Factors  after activity then sit, "I feel like it locks up"   ? Pain Relieving Factors pain meds   ? ?  ?  ? ?  ? ? ? ? ? [facility] PT Assessment - 12/11/21 0001   ? ?  ? AROM  ? Right Knee Extension 6   ? Right Knee Flexion 110   ?  ? PROM  ? Right Knee Extension 8   very tight  ? Right Knee Flexion 120   ? ?  ?  ? ?  ? ? ? ? ? ? ? ? ? ? ? ? ? ? ? ? [facility] Adult PT Treatment/Exercise - 12/11/21 0001   ? ?  ? Ambulation/Gait  ? Gait Comments stairs step over step on 4" with 2 hand rails, then no device in the clinic working on step length and faster pace, some cues to bend the knee tends to be stiff and do a little hip hike   ?  ?  High Level Balance  ? High Level Balance Activities Backward walking;Side stepping   ? High Level Balance Comments airex cone toe touches, then side stepping on and off airex, on airex ball toss, eyes closed and then head turns   ?  ? Knee/Hip Exercises: Aerobic  ? Recumbent Bike bike seat 8 x5 minutes full rotations   ? Nustep L 5  5 min   ?  ? Knee/Hip Exercises: Machines for Strengthening  ? Cybex Knee Extension 10# 2x10   ? Cybex Knee Flexion 25# 2x10   ?  ? Knee/Hip Exercises: Standing  ? Other Standing Knee Exercises 6" toe clears   ? ?  ?  ? ?  ? ? ? ? ? ? ? ? ? ? ? ? PT Short Term Goals - 11/17/21 1121   ? ?  ? PT SHORT TERM GOAL #1  ? Title Will be independent iwth appropriate progressive HEP   ? Baseline Patient reports inconsistent performance   ? Time 2   ? Period Weeks   ? Status On-going   ? Target Date 11/30/21   ?  ? PT SHORT TERM GOAL #2  ? Title R knee ROM to be no more than 8 degrees extension and will be at least 100 degrees flexion   ? Baseline 28-107   ? Time 2   ? Period Weeks   ? Status On-going   ?  ? PT SHORT TERM GOAL #3  ?  Title Gait pattern will improve to consistent heel-toe, step through pattern with upright posture, even step and stride lengths, and minimal unsteadiness with LRAD   ? Baseline VC for step through gait pattern.   ? Time 2   ? Period Weeks   ? Status On-going   ?  ? PT SHORT TERM GOAL #4  ? Title Will be independent with edema management strategies   ? Baseline Educated to elevate while lying in supine. She reports that she stays in recliner, but RLE with severe edema.   ? Time 2   ? Period Weeks   ? Status On-going   ? ?  ?  ? ?  ? ? ? ? PT Long Term Goals - 12/11/21 1708   ? ?  ? PT LONG TERM GOAL #1  ? Title MMT to improve by at least 1 grade in all weak groups   ? Status Partially Met   ?  ? PT LONG TERM GOAL #2  ? Title Will demonstrate R knee extension of no more than 5 degrees, and R knee flexion of at least 110 degrees   ? Status Partially Met   ?  ? PT LONG TERM GOAL #3  ? Title Will be able to ascend and descend at least 4 stairs with U rail and step over step pattern without difficulty   ? Status Partially Met   ?  ? PT LONG TERM GOAL #4  ? Title Will be able to complete TUG with LRAD in 20 seconds or less to show improved balance and mobility   ? Status Achieved   ? ?  ?  ? ?  ? ? ? ? ? ? ? ? Plan - 12/11/21 1527   ? ? Clinical Impression Statement Overall patient is doing well with her recovery, great incresae in flexion, still tight posterior which limits extension.  She is hesitant on the stairs, when walking even though she demonstrates great ROM she will have a stiff knee and do  a small hip hike.  Started some higher level balance activties   ? PT Next Visit Plan she will see MD tomorrow   ? Consulted and Agree with Plan of Care Patient   ? ?  ?  ? ?  ? ? ?Patient will benefit from skilled therapeutic intervention in order to improve the following deficits and impairments:  Abnormal gait, Decreased range of motion, Difficulty walking, Obesity, Increased muscle spasms, Decreased activity tolerance,  Decreased knowledge of precautions, Pain, Decreased balance, Decreased knowledge of use of DME, Decreased scar mobility, Hypomobility, Impaired flexibility, Decreased mobility, Decreased strength, Postural dysfunction, Impaired sensation, Increased edema ? ?Visit Diagnosis: ?Acute pain of right knee ? ?Stiffness of right knee, not elsewhere classified ? ?Difficulty in walking, not elsewhere classified ? ?Localized edema ? ? ? ? ?Problem List ?Patient Active Problem List  ? Diagnosis Date Noted  ? OA (osteoarthritis) of knee 12/19/2020  ? Primary osteoarthritis of left knee 12/19/2020  ? Obstructive sleep apnea 06/19/2019  ? Malignant neoplasm of overlapping sites of right breast in female, estrogen receptor positive ([place]) 05/14/2019  ? Coagulopathy ([place]) 05/14/2019  ? Morbid obesity ([place]) 05/14/2019  ? Chronic thromboembolic disease ([place]) 97/28/2060  ? Pulmonary vascular disease ([place]) 04/08/2019  ? Hypothyroidism 04/07/2019  ? Essential hypertension 04/07/2019  ? Pulmonary embolism ([place]) 04/06/2019  ? Abnormal liver function 04/06/2019  ? Hypokalemia 04/06/2019  ? Incisional hernia 04/06/2016  ? ? Sumner Boast, PT ?12/11/2021, 5:08 PM ? ?[place] ?[address] ?[address] ?[place], [place], [address] ?Phone: [phone]   Fax:  [phone] ? ?Name: Cassandra [name] ?MRN: [id number] ?Date of Birth: December 03, 1945 ? ? ? ?

## 2021-12-12 DIAGNOSIS — Z96652 Presence of left artificial knee joint: Secondary | ICD-10-CM | POA: Diagnosis not present

## 2021-12-12 DIAGNOSIS — Z471 Aftercare following joint replacement surgery: Secondary | ICD-10-CM | POA: Diagnosis not present

## 2021-12-13 ENCOUNTER — Other Ambulatory Visit: Payer: Self-pay

## 2021-12-13 ENCOUNTER — Encounter: Payer: Self-pay | Admitting: Physical Therapy

## 2021-12-13 ENCOUNTER — Ambulatory Visit: Payer: Medicare HMO | Admitting: Physical Therapy

## 2021-12-13 DIAGNOSIS — M25661 Stiffness of right knee, not elsewhere classified: Secondary | ICD-10-CM | POA: Diagnosis not present

## 2021-12-13 DIAGNOSIS — R262 Difficulty in walking, not elsewhere classified: Secondary | ICD-10-CM | POA: Diagnosis not present

## 2021-12-13 DIAGNOSIS — R600 Localized edema: Secondary | ICD-10-CM

## 2021-12-13 DIAGNOSIS — M25561 Pain in right knee: Secondary | ICD-10-CM | POA: Diagnosis not present

## 2021-12-13 NOTE — Therapy (Signed)
?[facility] ?[address] ?[place], [place], [address] ?Phone: [phone]   Fax:  [phone] ? ?Physical Therapy Treatment ? ?Patient Details  ?Name: Cassandra [name] ?MRN: [id number] ?Date of Birth: 01/24/1946 ?Referring Provider (PT): Aluisio ? ? ?Encounter Date: 12/13/2021 ? ? PT End of Session - 12/13/21 1729   ? ? Visit Number 15   ? Number of Visits 19   ? Date for PT Re-Evaluation 12/21/21   ? Authorization Type Humana   ? PT Start Time 1438   ? PT Stop Time 1524   ? PT Time Calculation (min) 46 min   ? Activity Tolerance Patient tolerated treatment well   ? Behavior During Therapy [facility] for tasks assessed/performed   ? ?  ?  ? ?  ? ? ?Past Medical History:  ?Diagnosis Date  ? Arthritis   ? Cancer [facility]   ? Breast cancer right '06- surgery, chemo, radiation- released after 5 yrs  ? Dyspnea   ? in 2020 had pe placed on eliquis  ? Hx of seasonal allergies   ? OTC med used  ? Hypertension   ? Hypothyroidism   ? Pulmonary embolism ([place])   ? hx of in 2020 placed on eliquis  ? Sleep apnea   ? cpap  ? ? ?Past Surgical History:  ?Procedure Laterality Date  ? BREAST SURGERY Right   ? modified partial mastectomy/ lymph nodes dissection  ? COLON SURGERY  2/17  ? INCISIONAL HERNIA REPAIR N/A 04/06/2016  ? Procedure: LAPAROSCOPIC ASSISTED INCISIONAL HERNIA REPAIR WITH MESH;  Surgeon: Leighton Ruff, MD;  [facility];  Service: General;  Laterality: N/A;  ? TONSILLECTOMY    ? TOTAL KNEE ARTHROPLASTY Left 12/19/2020  ? Procedure: TOTAL KNEE ARTHROPLASTY;  Surgeon: Gaynelle Arabian, MD;  [facility];  Service: Orthopedics;  Laterality: Left;  87min  ? TOTAL KNEE ARTHROPLASTY Right 11/06/2021  ? Procedure: TOTAL KNEE ARTHROPLASTY;  Surgeon: Gaynelle Arabian, MD;  [facility];  Service: Orthopedics;  Laterality: Right;  ? ? ?There were no vitals filed for this visit. ? ? Subjective Assessment - 12/13/21 1451   ? ? Subjective Reports that she saw the surgeon, he was  pleased with her progress, she reports that she is trying to not take as much pain medication and really had a hard time sleeping last night due to pain   ? Currently in Pain? Yes   ? Pain Score 3    ? Pain Location Knee   ? Pain Orientation Right   ? Pain Descriptors / Indicators Aching;Sore   ? Aggravating Factors  not taking pain meds   ? ?  ?  ? ?  ? ? ? ? ? ? ? ? ? ? ? ? ? ? ? ? ? ? ? ? [place] [facility] Adult PT Treatment/Exercise - 12/13/21 0001   ? ?  ? High Level Balance  ? High Level Balance Comments airex cone toe touches, then side stepping on and off airex, on airex ball toss, eyes closed and then head turns, walking ball toss, on airex beach ball   ?  ? Knee/Hip Exercises: Aerobic  ? Recumbent Bike bike seat 8 x5 minutes full rotations   ? Nustep L 5  5 min   ?  ? Knee/Hip Exercises: Machines for Strengthening  ? Cybex Knee Extension 10# 2x10   ? Cybex Knee Flexion 25# 3x10   ? Cybex Leg Press 30# 2 set s10 then no weight  for better ROM into flexion   ? Other Machine 5# straight arm pulls for core   ?  ? Manual Therapy  ? Manual Therapy Passive ROM   ? Soft tissue mobilization scar and tissue around the right knee and into the quad   ? Passive ROM right knee flexion and extension   ? ?  ?  ? ?  ? ? ? ? ? ? ? ? ? ? ? ? PT Short Term Goals - 11/17/21 1121   ? ?  ? PT SHORT TERM GOAL #1  ? Title Will be independent iwth appropriate progressive HEP   ? Baseline Patient reports inconsistent performance   ? Time 2   ? Period Weeks   ? Status On-going   ? Target Date 11/30/21   ?  ? PT SHORT TERM GOAL #2  ? Title R knee ROM to be no more than 8 degrees extension and will be at least 100 degrees flexion   ? Baseline 28-107   ? Time 2   ? Period Weeks   ? Status On-going   ?  ? PT SHORT TERM GOAL #3  ? Title Gait pattern will improve to consistent heel-toe, step through pattern with upright posture, even step and stride lengths, and minimal unsteadiness with LRAD   ? Baseline VC for step through gait pattern.   ? Time 2   ?  Period Weeks   ? Status On-going   ?  ? PT SHORT TERM GOAL #4  ? Title Will be independent with edema management strategies   ? Baseline Educated to elevate while lying in supine. She reports that she stays in recliner, but RLE with severe edema.   ? Time 2   ? Period Weeks   ? Status On-going   ? ?  ?  ? ?  ? ? ? ? PT Long Term Goals - 12/11/21 1708   ? ?  ? PT LONG TERM GOAL #1  ? Title MMT to improve by at least 1 grade in all weak groups   ? Status Partially Met   ?  ? PT LONG TERM GOAL #2  ? Title Will demonstrate R knee extension of no more than 5 degrees, and R knee flexion of at least 110 degrees   ? Status Partially Met   ?  ? PT LONG TERM GOAL #3  ? Title Will be able to ascend and descend at least 4 stairs with U rail and step over step pattern without difficulty   ? Status Partially Met   ?  ? PT LONG TERM GOAL #4  ? Title Will be able to complete TUG with LRAD in 20 seconds or less to show improved balance and mobility   ? Status Achieved   ? ?  ?  ? ?  ? ? ? ? ? ? ? ? Plan - 12/13/21 1730   ? ? Clinical Impression Statement Patient with some difficulty with balance.  She is a little stiff and we could look at extending for 1x/week if needed   ? PT Next Visit Plan continue until the 30th, if needed could look to extend at 1x/week.if we need to assure her balance and her ROM   ? Consulted and Agree with Plan of Care Patient   ? ?  ?  ? ?  ? ? ?Patient will benefit from skilled therapeutic intervention in order to improve the following deficits and impairments:  Abnormal gait, Decreased range of  motion, Difficulty walking, Obesity, Increased muscle spasms, Decreased activity tolerance, Decreased knowledge of precautions, Pain, Decreased balance, Decreased knowledge of use of DME, Decreased scar mobility, Hypomobility, Impaired flexibility, Decreased mobility, Decreased strength, Postural dysfunction, Impaired sensation, Increased edema ? ?Visit Diagnosis: ?Acute pain of right knee ? ?Stiffness of right  knee, not elsewhere classified ? ?Difficulty in walking, not elsewhere classified ? ?Localized edema ? ? ? ? ?Problem List ?Patient Active Problem List  ? Diagnosis Date Noted  ? OA (osteoarthritis) of knee 12/19/2020  ? Primary osteoarthritis of left knee 12/19/2020  ? Obstructive sleep apnea 06/19/2019  ? Malignant neoplasm of overlapping sites of right breast in female, estrogen receptor positive ([place]) 05/14/2019  ? Coagulopathy ([place]) 05/14/2019  ? Morbid obesity ([place]) 05/14/2019  ? Chronic thromboembolic disease ([place]) 02/98/4730  ? Pulmonary vascular disease ([place]) 04/08/2019  ? Hypothyroidism 04/07/2019  ? Essential hypertension 04/07/2019  ? Pulmonary embolism ([place]) 04/06/2019  ? Abnormal liver function 04/06/2019  ? Hypokalemia 04/06/2019  ? Incisional hernia 04/06/2016  ? ? Sumner Boast, PT ?12/13/2021, 5:34 PM ? ?[place] ?[address] ?[address] ?[place], [place], [address] ?Phone: [phone]   Fax:  [phone] ? ?Name: Cassandra [name] ?MRN: [id number] ?Date of Birth: 1946-05-24 ? ? ? ?

## 2021-12-15 ENCOUNTER — Ambulatory Visit: Payer: Medicare HMO | Admitting: Physical Therapy

## 2021-12-15 ENCOUNTER — Other Ambulatory Visit: Payer: Self-pay

## 2021-12-15 DIAGNOSIS — R262 Difficulty in walking, not elsewhere classified: Secondary | ICD-10-CM | POA: Diagnosis not present

## 2021-12-15 DIAGNOSIS — M25561 Pain in right knee: Secondary | ICD-10-CM | POA: Diagnosis not present

## 2021-12-15 DIAGNOSIS — M25661 Stiffness of right knee, not elsewhere classified: Secondary | ICD-10-CM

## 2021-12-15 DIAGNOSIS — R600 Localized edema: Secondary | ICD-10-CM | POA: Diagnosis not present

## 2021-12-15 NOTE — Therapy (Signed)
[facility] ?[place] ?[address] ?[place], [place], [address] ?Phone: [phone]   Fax:  [phone] ? ?Physical Therapy Treatment ? ?Patient Details  ?Name: Cassandra [name] ?MRN: [id number] ?Date of Birth: 30-Jul-1946 ?Referring Provider (PT): Aluisio ? ? ?Encounter Date: 12/15/2021 ? ? PT End of Session - 12/15/21 1022   ? ? Visit Number 16   ? Number of Visits 19   ? Date for PT Re-Evaluation 12/21/21   ? Authorization Type Humana   ? Authorization Time Period 11/09/21 to 12/21/21   ? PT Start Time 1015   ? PT Stop Time 1055   ? PT Time Calculation (min) 40 min   ? ?  ?  ? ?  ? ? ?Past Medical History:  ?Diagnosis Date  ? Arthritis   ? Cancer [facility])   ? Breast cancer right '06- surgery, chemo, radiation- released after 5 yrs  ? Dyspnea   ? in 2020 had pe placed on eliquis  ? Hx of seasonal allergies   ? OTC med used  ? Hypertension   ? Hypothyroidism   ? Pulmonary embolism ([place])   ? hx of in 2020 placed on eliquis  ? Sleep apnea   ? cpap  ? ? ?Past Surgical History:  ?Procedure Laterality Date  ? BREAST SURGERY Right   ? modified partial mastectomy/ lymph nodes dissection  ? COLON SURGERY  2/17  ? INCISIONAL HERNIA REPAIR N/A 04/06/2016  ? Procedure: LAPAROSCOPIC ASSISTED INCISIONAL HERNIA REPAIR WITH MESH;  Surgeon: Leighton Ruff, MD;  [facility];  Service: General;  Laterality: N/A;  ? TONSILLECTOMY    ? TOTAL KNEE ARTHROPLASTY Left 12/19/2020  ? Procedure: TOTAL KNEE ARTHROPLASTY;  Surgeon: Gaynelle Arabian, MD;  [facility];  Service: Orthopedics;  Laterality: Left;  22mn  ? TOTAL KNEE ARTHROPLASTY Right 11/06/2021  ? Procedure: TOTAL KNEE ARTHROPLASTY;  Surgeon: AGaynelle Arabian MD;  [facility];  Service: Orthopedics;  Laterality: Right;  ? ? ?There were no vitals filed for this visit. ? ? Subjective Assessment - 12/15/21 1016   ? ? Subjective pt amb in with SPC with excellent cadeance and stride. pt states pain mainly at night   ? Currently in Pain? Yes    ? Pain Score 2    ? Pain Location Knee   ? Pain Orientation Right   ? ?  ?  ? ?  ? ? ? ? ? ? ? ? ? ? ? ? ? ? ? ? ? ? ? ? [facility]/Exercise - 12/15/21 0001   ? ?  ? Ambulation/Gait  ? Gait Comments amb outside with SPC 800 feet navigating uneven terrain and curbs mod I with good stride and cadeance   ?  ? Knee/Hip Exercises: Aerobic  ? Recumbent Bike bike seat 8 x5 minutes full rotations L 3   ? Nustep L 5 6 min LE only   ?  ? Knee/Hip Exercises: Standing  ? Functional Squat 2 sets;10 reps   with blue tbnad TKE  ?  ? Knee/Hip Exercises: Seated  ? Long ACS[employer] CorporationStrengthening;Right;2 sets;10 reps   ? Long Arc Quad Weight 5 lbs.   ? Long ACSX CorporationLimitations 3 second holds   ? Other Seated Knee/Hip Exercises fitter flex and ext 2 sets 10   ? Hamstring Curl Strengthening;Both;2 sets;10 reps   green tband  ? ?  ?  ? ?  ? ? ? ? ? ? ? ? ? ? ? ?  PT Short Term Goals - 11/17/21 1121   ? ?  ? PT SHORT TERM GOAL #1  ? Title Will be independent iwth appropriate progressive HEP   ? Baseline Patient reports inconsistent performance   ? Time 2   ? Period Weeks   ? Status On-going   ? Target Date 11/30/21   ?  ? PT SHORT TERM GOAL #2  ? Title R knee ROM to be no more than 8 degrees extension and will be at least 100 degrees flexion   ? Baseline 28-107   ? Time 2   ? Period Weeks   ? Status On-going   ?  ? PT SHORT TERM GOAL #3  ? Title Gait pattern will improve to consistent heel-toe, step through pattern with upright posture, even step and stride lengths, and minimal unsteadiness with LRAD   ? Baseline VC for step through gait pattern.   ? Time 2   ? Period Weeks   ? Status On-going   ?  ? PT SHORT TERM GOAL #4  ? Title Will be independent with edema management strategies   ? Baseline Educated to elevate while lying in supine. She reports that she stays in recliner, but RLE with severe edema.   ? Time 2   ? Period Weeks   ? Status On-going   ? ?  ?  ? ?  ? ? ? ? PT Long Term Goals - 12/11/21 1708   ? ?  ? PT LONG  TERM GOAL #1  ? Title MMT to improve by at least 1 grade in all weak groups   ? Status Partially Met   ?  ? PT LONG TERM GOAL #2  ? Title Will demonstrate R knee extension of no more than 5 degrees, and R knee flexion of at least 110 degrees   ? Status Partially Met   ?  ? PT LONG TERM GOAL #3  ? Title Will be able to ascend and descend at least 4 stairs with U rail and step over step pattern without difficulty   ? Status Partially Met   ?  ? PT LONG TERM GOAL #4  ? Title Will be able to complete TUG with LRAD in 20 seconds or less to show improved balance and mobility   ? Status Achieved   ? ?  ?  ? ?  ? ? ? ? ? ? ? ? Plan - 12/15/21 1019   ? ? Clinical Impression Statement pt is making excellent progress. concerned about carry over after therapy so may look at 1 x a week for 4 weeks. pt does report more active at home. stayed away form heavy pushing to see if helps not be so painful later that night and next day   ? PT Treatment/Interventions Cryotherapy;Moist Heat;Vasopneumatic Device;Gait training;Electrical Stimulation;Iontophoresis 21m/ml Dexamethasone;Therapeutic exercise;Ultrasound;Manual techniques;Neuromuscular re-education;Therapeutic activities;ADLs/Self Care Home Management   ? PT Next Visit Plan continue until the 30th, if needed could look to extend at 1x/week to assure fwd progression   ? ?  ?  ? ?  ? ? ?Patient will benefit from skilled therapeutic intervention in order to improve the following deficits and impairments:  Abnormal gait, Decreased range of motion, Difficulty walking, Obesity, Increased muscle spasms, Decreased activity tolerance, Decreased knowledge of precautions, Pain, Decreased balance, Decreased knowledge of use of DME, Decreased scar mobility, Hypomobility, Impaired flexibility, Decreased mobility, Decreased strength, Postural dysfunction, Impaired sensation, Increased edema ? ?Visit Diagnosis: ?Acute pain of right knee ? ?Stiffness  of right knee, not elsewhere  classified ? ?Difficulty in walking, not elsewhere classified ? ? ? ? ?Problem List ?Patient Active Problem List  ? Diagnosis Date Noted  ? OA (osteoarthritis) of knee 12/19/2020  ? Primary osteoarthritis of left knee 12/19/2020  ? Obstructive sleep apnea 06/19/2019  ? Malignant neoplasm of overlapping sites of right breast in female, estrogen receptor positive ([place]) 05/14/2019  ? Coagulopathy ([place]) 05/14/2019  ? Morbid obesity ([place]) 05/14/2019  ? Chronic thromboembolic disease ([place]) 96/22/2979  ? Pulmonary vascular disease ([place]) 04/08/2019  ? Hypothyroidism 04/07/2019  ? Essential hypertension 04/07/2019  ? Pulmonary embolism ([place]) 04/06/2019  ? Abnormal liver function 04/06/2019  ? Hypokalemia 04/06/2019  ? Incisional hernia 04/06/2016  ? ? ?Cassandra [name],ANGIE, PTA ?12/15/2021, 10:52 AM ? ?[place] ?[place] [address] ?[place], [place], [address] ?Phone: [phone]   Fax:  [phone] ? ?Name: Cassandra [name] ?MRN: [id number] ?Date of Birth: 12-02-1945 ? ? ? ?

## 2021-12-19 ENCOUNTER — Other Ambulatory Visit: Payer: Self-pay

## 2021-12-19 ENCOUNTER — Ambulatory Visit: Payer: Medicare HMO | Admitting: Physical Therapy

## 2021-12-19 DIAGNOSIS — M25561 Pain in right knee: Secondary | ICD-10-CM

## 2021-12-19 DIAGNOSIS — R262 Difficulty in walking, not elsewhere classified: Secondary | ICD-10-CM | POA: Diagnosis not present

## 2021-12-19 DIAGNOSIS — R600 Localized edema: Secondary | ICD-10-CM | POA: Diagnosis not present

## 2021-12-19 DIAGNOSIS — M25661 Stiffness of right knee, not elsewhere classified: Secondary | ICD-10-CM

## 2021-12-19 NOTE — Therapy (Signed)
[facility] ?[address] ?[place], [place], [address] ?Phone: [phone]   Fax:  [phone] ? ?Physical Therapy Treatment ? ?Patient Details  ?Name: Cassandra [name] ?MRN: [id number] ?Date of Birth: [birth year] ?Referring Provider (PT): Aluisio ? ? ?Encounter Date: 12/19/2021 ? ? PT End of Session - 12/19/21 1431   ? ? Visit Number 17   ? Number of Visits 19   ? Date for PT Re-Evaluation 12/21/21   ? Authorization Type Hum[employer]   ? Authorization Time Period 11/09/21 to 12/21/21   ? PT Start Time 1350   ? PT Stop Time 7672   ? PT Time Calculation (min) 42 min   ? ?  ?  ? ?  ? ? ?Past Medical History:  ?Diagnosis Date  ? Arthritis   ? Cancer [facility])   ? Breast cancer right '06- surgery, chemo, radiation- released after 5 yrs  ? Dyspnea   ? in 2020 had pe placed on eliquis  ? Hx of seasonal allergies   ? OTC med used  ? Hypertension   ? Hypothyroidism   ? Pulmonary embolism ([place])   ? hx of in 2020 placed on eliquis  ? Sleep apnea   ? cpap  ? ? ?Past Surgical History:  ?Procedure Laterality Date  ? BREAST SURGERY Right   ? modified partial mastectomy/ lymph nodes dissection  ? COLON SURGERY  2/17  ? INCISIONAL HERNIA REPAIR N/A 04/06/2016  ? Procedure: LAPAROSCOPIC ASSISTED INCISIONAL HERNIA REPAIR WITH MESH;  Surgeon: Leighton Ruff, MD;  [facility]: [facility];  Service: General;  Laterality: N/A;  ? TONSILLECTOMY    ? TOTAL KNEE ARTHROPLASTY Left 12/19/2020  ? Procedure: TOTAL KNEE ARTHROPLASTY;  Surgeon: Gaynelle Arabian, MD;  [facility];  Service: Orthopedics;  Laterality: Left;  10mn  ? TOTAL KNEE ARTHROPLASTY Right 11/06/2021  ? Procedure: TOTAL KNEE ARTHROPLASTY;  Surgeon: AGaynelle Arabian MD;  [facility];  Service: Orthopedics;  Laterality: Right;  ? ? ?There were no vitals filed for this visit. ? ? Subjective Assessment - 12/19/21 1355   ? ? Subjective walked more over weekend and sore. no pain at rest. less meds   ? Currently in Pain? Yes   ? Pain Score 2    ?  Pain Location Knee   ? Pain Orientation Right   ? ?  ?  ? ?  ? ? ? ? ? [facility] PT Assessment - 12/19/21 0001   ? ?  ? AROM  ? AROM Assessment Site Knee   ? Right/Left Knee Right   ? Right Knee Extension 5   ? Right Knee Flexion 115   ?  ? Strength  ? Right Knee Flexion 4+/5   ? Right Knee Extension 4-/5   ? ?  ?  ? ?  ? ? ? ? ? ? ? ? ? ? ? ? ? ? ? ? O[facility]vinAdult PT Treatment/Exercise - 12/19/21 0001   ? ?  ? Knee/Hip Exercises: Aerobic  ? Recumbent Bike bike seat 7 x5 minutes full rotations L 3   ? Nustep L 5 6 min LE only   ?  ? Knee/Hip Exercises: Standing  ? Heel Raises Both;15 reps   on step  ? Forward Step Up Both;10 reps;Hand Hold: 0;Step Height: 6"   on/off airex  ? Walking with Sports Cord 30# backward with focus on TKE 6 x, then 5 x fwd and 3 x each side   ?  ?  Knee/Hip Exercises: Seated  ? Long [employer] Strengthening;Right;2 sets;10 reps   ? Long [employer] Limitations 3 second holds   green tband  ? Hamstring Curl Strengthening;Both;2 sets;10 reps   green tband  ? Sit to Sand 10 reps;without UE support   on airex  ? ?  ?  ? ?  ? ? ? ? ? ? ? ? ? ? ? ? PT Short Term Goals - 11/17/21 1121   ? ?  ? PT SHORT TERM GOAL #1  ? Title Will be independent iwth appropriate progressive HEP   ? Baseline Patient reports inconsistent performance   ? Time 2   ? Period Weeks   ? Status On-going   ? Target Date 11/30/21   ?  ? PT SHORT TERM GOAL #2  ? Title R knee ROM to be no more than 8 degrees extension and will be at least 100 degrees flexion   ? Baseline 28-107   ? Time 2   ? Period Weeks   ? Status On-going   ?  ? PT SHORT TERM GOAL #3  ? Title Gait pattern will improve to consistent heel-toe, step through pattern with upright posture, even step and stride lengths, and minimal unsteadiness with LRAD   ? Baseline VC for step through gait pattern.   ? Time 2   ? Period Weeks   ? Status On-going   ?  ? PT SHORT TERM GOAL #4  ? Title Will be independent with edema management strategies   ? Baseline Educated to elevate while  lying in supine. She reports that she stays in recliner, but RLE with severe edema.   ? Time 2   ? Period Weeks   ? Status On-going   ? ?  ?  ? ?  ? ? ? ? PT Long Term Goals - 12/19/21 1422   ? ?  ? PT LONG TERM GOAL #1  ? Title MMT to improve by at least 1 grade in all weak groups   ? Status Partially Met   ?  ? PT LONG TERM GOAL #2  ? Title Will demonstrate R knee extension of no more than 5 degrees, and R knee flexion of at least 110 degrees   ? Status Achieved   ?  ? PT LONG TERM GOAL #3  ? Title Will be able to ascend and descend at least 4 stairs with U rail and step over step pattern without difficulty   ? Status Partially Met   ? ?  ?  ? ?  ? ? ? ? ? ? ? ? Plan - 12/19/21 1423   ? ? Clinical Impression Statement pt has made great progress but still lacks TKE ROM and strength .after this week will decrease in freq to 1 x a week and transiton to independant ex   ? PT Treatment/Interventions Cryotherapy;Moist Heat;Vasopneumatic Device;Gait training;Electrical Stimulation;Iontophoresis 76m/ml Dexamethasone;Therapeutic exercise;Ultrasound;Manual techniques;Neuromuscular re-education;Therapeutic activities;ADLs/Self Care Home Management   ? PT Next Visit Plan fill out form and decrease freq to 1 x a week   ? ?  ?  ? ?  ? ? ?Patient will benefit from skilled therapeutic intervention in order to improve the following deficits and impairments:  Abnormal gait, Decreased range of motion, Difficulty walking, Obesity, Increased muscle spasms, Decreased activity tolerance, Decreased knowledge of precautions, Pain, Decreased balance, Decreased knowledge of use of DME, Decreased scar mobility, Hypomobility, Impaired flexibility, Decreased mobility, Decreased strength, Postural dysfunction, Impaired sensation, Increased edema ? ?Visit  Diagnosis: ?Acute pain of right knee ? ?Stiffness of right knee, not elsewhere classified ? ? ? ? ?Problem List ?Patient Active Problem List  ? Diagnosis Date Noted  ? OA (osteoarthritis) of  knee 12/19/2020  ? Primary osteoarthritis of left knee 12/19/2020  ? Obstructive sleep apnea 06/19/2019  ? Malignant neoplasm of overlapping sites of right breast in female, estrogen receptor positive ([place]) 05/14/2019  ? Coagulopathy ([place]) 05/14/2019  ? Morbid obesity ([place]) 05/14/2019  ? Chronic thromboembolic disease ([place]) 09/62/8366  ? Pulmonary vascular disease ([place]) 04/08/2019  ? Hypothyroidism 04/07/2019  ? Essential hypertension 04/07/2019  ? Pulmonary embolism ([place]) 04/06/2019  ? Abnormal liver function 04/06/2019  ? Hypokalemia 04/06/2019  ? Incisional hernia 04/06/2016  ? ? ?Insiya Oshea,ANGIE, PTA ?12/19/2021, 2:32 PM ? ?[facility] ?[address] ?[address] ?[place], [place], [address] ?Phone: [phone]   Fax:  [phone] ? ?Name: Cassandra [name] ?MRN: [id number] ?Date of Birth: Mar 03, 1946 ? ? ? ?

## 2021-12-21 ENCOUNTER — Ambulatory Visit: Payer: Medicare HMO | Admitting: Physical Therapy

## 2021-12-21 DIAGNOSIS — M25661 Stiffness of right knee, not elsewhere classified: Secondary | ICD-10-CM

## 2021-12-21 DIAGNOSIS — R262 Difficulty in walking, not elsewhere classified: Secondary | ICD-10-CM | POA: Diagnosis not present

## 2021-12-21 DIAGNOSIS — R600 Localized edema: Secondary | ICD-10-CM | POA: Diagnosis not present

## 2021-12-21 DIAGNOSIS — M25561 Pain in right knee: Secondary | ICD-10-CM | POA: Diagnosis not present

## 2021-12-21 NOTE — Therapy (Signed)
[facility] ?[address] ?[place], [place], [address] ?Phone: [phone]   Fax:  [phone] ? ?Physical Therapy Treatment ? ?Patient Details  ?Name: Cassandra [name] ?MRN: [id number] ?Date of Birth: Feb 15, 1946 ?Referring Provider (PT): Aluisio ? ? ?Encounter Date: 12/21/2021 ? ? PT End of Session - 12/21/21 1607   ? ? Visit Number 18   ? Authorization Type Humana   ? PT Start Time 1530   ? PT Stop Time 1612   ? PT Time Calculation (min) 42 min   ? ?  ?  ? ?  ? ? ?Past Medical History:  ?Diagnosis Date  ? Arthritis   ? Cancer [facility])   ? Breast cancer right '06- surgery, chemo, radiation- released after 5 yrs  ? Dyspnea   ? in 2020 had pe placed on eliquis  ? Hx of seasonal allergies   ? OTC med used  ? Hypertension   ? Hypothyroidism   ? Pulmonary embolism ([place])   ? hx of in 2020 placed on eliquis  ? Sleep apnea   ? cpap  ? ? ?Past Surgical History:  ?Procedure Laterality Date  ? BREAST SURGERY Right   ? modified partial mastectomy/ lymph nodes dissection  ? COLON SURGERY  2/17  ? INCISIONAL HERNIA REPAIR N/A 04/06/2016  ? Procedure: LAPAROSCOPIC ASSISTED INCISIONAL HERNIA REPAIR WITH MESH;  Surgeon: Leighton Ruff, MD;  [facility];  Service: General;  Laterality: N/A;  ? TONSILLECTOMY    ? TOTAL KNEE ARTHROPLASTY Left 12/19/2020  ? Procedure: TOTAL KNEE ARTHROPLASTY;  Surgeon: Gaynelle Arabian, MD;  [facility];  Service: Orthopedics;  Laterality: Left;  41mn  ? TOTAL KNEE ARTHROPLASTY Right 11/06/2021  ? Procedure: TOTAL KNEE ARTHROPLASTY;  Surgeon: AGaynelle Arabian MD;  [facility];  Service: Orthopedics;  Laterality: Right;  ? ? ?There were no vitals filed for this visit. ? ? Subjective Assessment - 12/21/21 1533   ? ? Subjective stiff , but better than pain   ? ?  ?  ? ?  ? ? ? ? ? [facility] PT Assessment - 12/21/21 0001   ? ?  ? AROM  ? AROM Assessment Site Knee   ? Right/Left Knee Right   ? Right Knee Extension 5   ? Right Knee Flexion 115   ?  ?  Strength  ? Right Knee Flexion 4+/5   ? Right Knee Extension 4-/5   ? ?  ?  ? ?  ? ? ? ? ? ? ? ? ? ? ? ? ? ? ? ? [place]Adult PT Treatment/Exercise - 12/21/21 0001   ? ?  ? Ambulation/Gait  ? Stairs Yes   ? Stairs Assistance 5: Supervision   ? Stair Management Technique One rail Right;Alternating pattern   ? Gait Comments worked on stairs with god tech and reciprocol motion   ?  ? Knee/Hip Exercises: Aerobic  ? Recumbent Bike bike seat 7 x6  minutes full rotations L 3   ? Nustep L 5 6 min LE only   ?  ? Knee/Hip Exercises: Machines for Strengthening  ? Cybex Knee Extension 10# 3x10   ? Cybex Knee Flexion 25# 3x10   ? Cybex Leg Press 30# 3 sets 10   calf raises 30# 3 sets 10  ?  ? Knee/Hip Exercises: Seated  ? Sit to Sand 10 reps;without UE support   on airex  ? ?  ?  ? ?  ? ? ? ? ? ? ? ? ? ? ? ?  PT Short Term Goals - 11/17/21 1121   ? ?  ? PT SHORT TERM GOAL #1  ? Title Will be independent iwth appropriate progressive HEP   ? Baseline Patient reports inconsistent performance   ? Time 2   ? Period Weeks   ? Status On-going   ? Target Date 11/30/21   ?  ? PT SHORT TERM GOAL #2  ? Title R knee ROM to be no more than 8 degrees extension and will be at least 100 degrees flexion   ? Baseline 28-107   ? Time 2   ? Period Weeks   ? Status On-going   ?  ? PT SHORT TERM GOAL #3  ? Title Gait pattern will improve to consistent heel-toe, step through pattern with upright posture, even step and stride lengths, and minimal unsteadiness with LRAD   ? Baseline VC for step through gait pattern.   ? Time 2   ? Period Weeks   ? Status On-going   ?  ? PT SHORT TERM GOAL #4  ? Title Will be independent with edema management strategies   ? Baseline Educated to elevate while lying in supine. She reports that she stays in recliner, but RLE with severe edema.   ? Time 2   ? Period Weeks   ? Status On-going   ? ?  ?  ? ?  ? ? ? ? PT Long Term Goals - 12/21/21 1540   ? ?  ? PT LONG TERM GOAL #1  ? Title MMT to improve by at least 1 grade in all  weak groups   ? Status Partially Met   ?  ? PT LONG TERM GOAL #2  ? Title Will demonstrate R knee extension of no more than 5 degrees, and R knee flexion of at least 110 degrees   ? Status Achieved   ?  ? PT LONG TERM GOAL #3  ? Title Will be able to ascend and descend at least 4 stairs with U rail and step over step pattern without difficulty   ? Baseline can do but difficult   ? Status Partially Met   ?  ? PT LONG TERM GOAL #4  ? Title Will be able to complete TUG with LRAD in 20 seconds or less to show improved balance and mobility   ? Status Achieved   ?  ? PT LONG TERM GOAL #5  ? Title Pt will demo circumferential edema of L knee </= 2 cm diff from R knee   ? Status Achieved   ? ?  ?  ? ?  ? ? ? ? ? ? ? ? Plan - 12/21/21 1541   ? ? Clinical Impression Statement pt continues to make good gains with therapy and goal progression . still struggling with pain and stiffness and lacking TKE.  would like to continue with therapy 1 x a week to assure she maintains and progresses as she works towards independant gym   ? PT Treatment/Interventions Cryotherapy;Moist Heat;Vasopneumatic Device;Gait training;Electrical Stimulation;Iontophoresis 60m/ml Dexamethasone;Therapeutic exercise;Ultrasound;Manual techniques;Neuromuscular re-education;Therapeutic activities;ADLs/Self Care Home Management   ? PT Next Visit Plan will request 1 x a week for 4 weeks   ? ?  ?  ? ?  ? ? ?Patient will benefit from skilled therapeutic intervention in order to improve the following deficits and impairments:  Abnormal gait, Decreased range of motion, Difficulty walking, Obesity, Increased muscle spasms, Decreased activity tolerance, Decreased knowledge of precautions, Pain, Decreased balance, Decreased knowledge of  use of DME, Decreased scar mobility, Hypomobility, Impaired flexibility, Decreased mobility, Decreased strength, Postural dysfunction, Impaired sensation, Increased edema ? ?Visit Diagnosis: ?Stiffness of right knee, not elsewhere  classified ? ?Difficulty in walking, not elsewhere classified ? ? ? ? ?Problem List ?Patient Active Problem List  ? Diagnosis Date Noted  ? OA (osteoarthritis) of knee 12/19/2020  ? Primary osteoarthritis of left knee 12/19/2020  ? Obstructive sleep apnea 06/19/2019  ? Malignant neoplasm of overlapping sites of right breast in female, estrogen receptor positive ([place]) 05/14/2019  ? Coagulopathy ([place]) 05/14/2019  ? Morbid obesity ([place]) 05/14/2019  ? Chronic thromboembolic disease ([place]) 32/54/9826  ? Pulmonary vascular disease ([place]) 04/08/2019  ? Hypothyroidism 04/07/2019  ? Essential hypertension 04/07/2019  ? Pulmonary embolism ([place]) 04/06/2019  ? Abnormal liver function 04/06/2019  ? Hypokalemia 04/06/2019  ? Incisional hernia 04/06/2016  ? ? ?Jenniferlynn Saad,ANGIE, PTA ?12/21/2021, 4:08 PM ? ?[facility] ?[address] ?[address] ?[place], [place], [address] ?Phone: [phone]   Fax:  [phone] ? ?Name: Cassandra [name] ?MRN: [id number] ?Date of Birth: Oct 13, 1945 ? ? ? ?

## 2022-01-04 ENCOUNTER — Ambulatory Visit: Payer: Medicare HMO | Attending: Orthopedic Surgery | Admitting: Physical Therapy

## 2022-01-04 ENCOUNTER — Encounter: Payer: Self-pay | Admitting: Physical Therapy

## 2022-01-04 DIAGNOSIS — R262 Difficulty in walking, not elsewhere classified: Secondary | ICD-10-CM | POA: Diagnosis not present

## 2022-01-04 DIAGNOSIS — M25561 Pain in right knee: Secondary | ICD-10-CM | POA: Insufficient documentation

## 2022-01-04 DIAGNOSIS — M25661 Stiffness of right knee, not elsewhere classified: Secondary | ICD-10-CM | POA: Diagnosis not present

## 2022-01-04 NOTE — Therapy (Signed)
[facility] ?[address] ?[place], [place], [address] ?Phone: [phone]   Fax:  [phone] ? ?Physical Therapy Treatment ? ?Patient Details  ?Name: Cassandra [name] ?MRN: [id number] ?Date of Birth: December 26, 1945 ?Referring Provider (PT): Aluisio ? ? ?Encounter Date: 01/04/2022 ? ? PT End of Session - 01/04/22 1746   ? ? Visit Number 19   ? Number of Visits 19   ? Date for PT Re-Evaluation 12/21/21   ? Authorization Type Humana   ? Authorization Time Period 11/09/21 to 12/21/21   ? Progress Note Due on Visit 20   ? PT Start Time 6789   ? PT Stop Time 3810   ? PT Time Calculation (min) 44 min   ? Activity Tolerance Patient tolerated treatment well   ? Behavior During Therapy [facility] for tasks assessed/performed   ? ?  ?  ? ?  ? ? ?Past Medical History:  ?Diagnosis Date  ? Arthritis   ? Cancer [facility])   ? Breast cancer right '06- surgery, chemo, radiation- released after 5 yrs  ? Dyspnea   ? in 2020 had pe placed on eliquis  ? Hx of seasonal allergies   ? OTC med used  ? Hypertension   ? Hypothyroidism   ? Pulmonary embolism ([place])   ? hx of in 2020 placed on eliquis  ? Sleep apnea   ? cpap  ? ? ?Past Surgical History:  ?Procedure Laterality Date  ? BREAST SURGERY Right   ? modified partial mastectomy/ lymph nodes dissection  ? COLON SURGERY  2/17  ? INCISIONAL HERNIA REPAIR N/A 04/06/2016  ? Procedure: LAPAROSCOPIC ASSISTED INCISIONAL HERNIA REPAIR WITH MESH;  Surgeon: Leighton Ruff, MD;  [facility];  Service: General;  Laterality: N/A;  ? TONSILLECTOMY    ? TOTAL KNEE ARTHROPLASTY Left 12/19/2020  ? Procedure: TOTAL KNEE ARTHROPLASTY;  Surgeon: Gaynelle Arabian, MD;  [facility];  Service: Orthopedics;  Laterality: Left;  7mn  ? TOTAL KNEE ARTHROPLASTY Right 11/06/2021  ? Procedure: TOTAL KNEE ARTHROPLASTY;  Surgeon: AGaynelle Arabian MD;  [facility];  Service: Orthopedics;  Laterality: Right;  ? ? ?There were no vitals filed for this visit. ? ? Subjective  Assessment - 01/04/22 1659   ? ? Subjective I'm stiff but I'm having pain on the outside of my knee as if I had been hit but I haven't, it just came out of nowhere.   ? Patient Stated Goals get moving better, get pain down   ? Currently in Pain? Yes   ? Pain Score 3    ? Pain Location Knee   ? Pain Orientation Right   ? Pain Descriptors / Indicators Aching;Sore   ? ?  ?  ? ?  ? ? ? ? ? ? ? ? ? ? ? ? ? ? ? ? ? ? ? ? O[place] GibsonAdult PT Treatment/Exercise - 01/04/22 0001   ? ?  ? Knee/Hip Exercises: Stretches  ? Passive Hamstring Stretch Right;3 reps;30 seconds   ? Quad Stretch Right;3 reps;30 seconds   PROM  ?  ? Knee/Hip Exercises: Aerobic  ? Nustep L 5 6 min LE only   ?  ? Knee/Hip Exercises: Machines for Strengthening  ? Cybex Knee Extension 10# 2x10   ? Cybex Knee Flexion 35# 2x10   ? Cybex Leg Press 40# 2x10   ?  ? Knee/Hip Exercises: Standing  ? Heel Raises Both;2 sets;10 reps   on black bar  ?  Hip Extension AROM;Both;Stengthening;2 sets;10 reps   red Tb  ? Other Standing Knee Exercises toe raises 2x10   on black bar  ?  ? Knee/Hip Exercises: Seated  ? Long Arc Quad AROM;Strengthening;Both;2 sets;10 reps   3#  ? Hamstring Curl AROM;Strengthening;Right;2 sets;10 reps   red TB  ? Sit to Sand 10 reps;without UE support   ?  ? Manual Therapy  ? Manual therapy comments --   MMT R knee flex/ext 5/5; 12 -111 degree knee flexion.  ? ?  ?  ? ?  ? ? ? ? ? ? ? ? ? ? ? ? PT Short Term Goals - 01/04/22 1754   ? ?  ? PT SHORT TERM GOAL #2  ? Title R knee ROM to be no more than 8 degrees extension and will be at least 100 degrees flexion   ? Baseline 12-111   ? Time 2   ? Period Weeks   ? Status On-going   ? ?  ?  ? ?  ? ? ? ? PT Long Term Goals - 01/04/22 1751   ? ?  ? PT LONG TERM GOAL #1  ? Title MMT to improve by at least 1 grade in all weak groups   ? Baseline 5/5 knee flex/ext. WNL   ? Time 6   ? Period Weeks   ? Status Partially Met   ? Target Date 12/21/21   ?  ? PT LONG TERM GOAL #2  ? Period Weeks   ? ?  ?  ? ?   ? ? ? ? ? ? ? ? Plan - 01/04/22 1747   ? ? Clinical Impression Statement Patient had stiffness and pain of R knee. Patient's knee ROM has decreased 12 - 111 degrees of flexion. Focused on stretching and strengthening. Reviewed HEP with patient.   ? Personal Factors and Comorbidities Fitness;Past/Current Experience;Comorbidity 1;Time since onset of injury/illness/exacerbation   ? Examination-Activity Limitations Locomotion Level;Transfers;Bed Mobility;Bend;Sit;Sleep;Squat;Stairs;Lift;Stand   ? Examination-Participation Restrictions Community Activity;Driving;Shop;Laundry;[place] Work   ? Stability/Clinical Decision Making Stable/Uncomplicated   ? Clinical Decision Making Low   ? Rehab Potential Good   ? PT Frequency 3x / week   ? PT Duration 6 weeks   ? PT Treatment/Interventions Cryotherapy;Moist Heat;Vasopneumatic Device;Gait training;Electrical Stimulation;Iontophoresis 24m/ml Dexamethasone;Therapeutic exercise;Ultrasound;Manual techniques;Neuromuscular re-education;Therapeutic activities;ADLs/Self Care Home Management   ? PT Next Visit Plan progress as tolerated   ? PT Home Exercise Plan still on hospital HEP   ? Consulted and Agree with Plan of Care Patient   ? ?  ?  ? ?  ? ? ?Patient will benefit from skilled therapeutic intervention in order to improve the following deficits and impairments:  Abnormal gait, Decreased range of motion, Difficulty walking, Obesity, Increased muscle spasms, Decreased activity tolerance, Decreased knowledge of precautions, Pain, Decreased balance, Decreased knowledge of use of DME, Decreased scar mobility, Hypomobility, Impaired flexibility, Decreased mobility, Decreased strength, Postural dysfunction, Impaired sensation, Increased edema ? ?Visit Diagnosis: ?Stiffness of right knee, not elsewhere classified ? ?Difficulty in walking, not elsewhere classified ? ?Acute pain of right knee ? ? ? ? ?Problem List ?Patient Active Problem List  ? Diagnosis Date Noted  ? OA (osteoarthritis) of  knee 12/19/2020  ? Primary osteoarthritis of left knee 12/19/2020  ? Obstructive sleep apnea 06/19/2019  ? Malignant neoplasm of overlapping sites of right breast in female, estrogen receptor positive ([place] 05/14/2019  ? Coagulopathy ([place] 05/14/2019  ? Morbid obesity ([place] 05/14/2019  ? Chronic thromboembolic disease ([place] 022/33/6122 ?  Pulmonary vascular disease ([place]) 04/08/2019  ? Hypothyroidism 04/07/2019  ? Essential hypertension 04/07/2019  ? Pulmonary embolism ([place]) 04/06/2019  ? Abnormal liver function 04/06/2019  ? Hypokalemia 04/06/2019  ? Incisional hernia 04/06/2016  ? ? ?Cellie Dardis Chauncey Cruel ?01/04/2022, 5:56 PM ? ?[facility] ?[address] ?[address] ?[place], [place], [address] ?Phone: [phone]   Fax:  [phone] ? ?Name: Cassandra [name] ?MRN: [id number] ?Date of Birth: [birth year] ? ? ? ?

## 2022-01-11 ENCOUNTER — Encounter: Payer: Self-pay | Admitting: Physical Therapy

## 2022-01-11 ENCOUNTER — Ambulatory Visit: Payer: Medicare HMO | Admitting: Physical Therapy

## 2022-01-11 DIAGNOSIS — R262 Difficulty in walking, not elsewhere classified: Secondary | ICD-10-CM

## 2022-01-11 DIAGNOSIS — M25661 Stiffness of right knee, not elsewhere classified: Secondary | ICD-10-CM | POA: Diagnosis not present

## 2022-01-11 DIAGNOSIS — M25561 Pain in right knee: Secondary | ICD-10-CM | POA: Diagnosis not present

## 2022-01-11 NOTE — Therapy (Signed)
[facility] [address] ?[place], [place], [address] ?Phone: [phone]   Fax:  [phone] ? ?Physical Therapy Treatment ? ?Patient Details  ?Name: Cassandra [name] ?MRN: [id number] ?Date of Birth: 06/21/1946 ?Referring Provider (PT): Aluisio ? ? ?Encounter Date: 01/11/2022 ? ? PT End of Session - 01/11/22 1615   ? ? Visit Number 20   ? Date for PT Re-Evaluation 12/21/21   ? Authorization Type [employer]   ? Authorization Time Period 11/09/21 to 12/21/21   ? Progress Note Due on Visit 20   ? PT Start Time 1533   ? PT Stop Time 1612   ? PT Time Calculation (min) 39 min   ? Activity Tolerance Patient tolerated treatment well   ? Behavior During Therapy [facility] for tasks assessed/performed   ? ?  ?  ? ?  ? ? ?Past Medical History:  ?Diagnosis Date  ? Arthritis   ? Cancer [facility])   ? Breast cancer right '06- surgery, chemo, radiation- released after 5 yrs  ? Dyspnea   ? in 2020 had pe placed on eliquis  ? Hx of seasonal allergies   ? OTC med used  ? Hypertension   ? Hypothyroidism   ? Pulmonary embolism ([place])   ? hx of in 2020 placed on eliquis  ? Sleep apnea   ? cpap  ? ? ?Past Surgical History:  ?Procedure Laterality Date  ? BREAST SURGERY Right   ? modified partial mastectomy/ lymph nodes dissection  ? COLON SURGERY  2/17  ? INCISIONAL HERNIA REPAIR N/A 04/06/2016  ? Procedure: LAPAROSCOPIC ASSISTED INCISIONAL HERNIA REPAIR WITH MESH;  Surgeon: Leighton Ruff, MD;  [facility]: [facility];  Service: General;  Laterality: N/A;  ? TONSILLECTOMY    ? TOTAL KNEE ARTHROPLASTY Left 12/19/2020  ? Procedure: TOTAL KNEE ARTHROPLASTY;  Surgeon: Gaynelle Arabian, MD;  [facility]: [facility];  Service: Orthopedics;  Laterality: Left;  13mn  ? TOTAL KNEE ARTHROPLASTY Right 11/06/2021  ? Procedure: TOTAL KNEE ARTHROPLASTY;  Surgeon: AGaynelle Arabian MD;  [facility];  Service: Orthopedics;  Laterality: Right;  ? ? ?There were no vitals filed for this visit. ? ? Subjective Assessment - 01/11/22 1539    ? ? Subjective I'm stiff, not really having sharp pain it's more like pain from stiffness.   ? Currently in Pain? Yes   ? Pain Score 3    ? Pain Location Knee   ? Pain Orientation Right   ? Pain Descriptors / Indicators Sore   ? ?  ?  ? ?  ? ? ? ? ? ? ? ? ? ? ? ? ? ? ? ? ? ? ? ? [facility] Adult PT Treatment/Exercise - 01/11/22 0001   ? ?  ? Knee/Hip Exercises: Aerobic  ? Nustep Lvl 6 x 6 mins   ?  ? Knee/Hip Exercises: Machines for Strengthening  ? Cybex Knee Extension #20 x10 B LE; #5 R LE 2x10   ? Cybex Knee Flexion 35# x15 BLE; #15 R LE x10; #20 R LE x10   ? Cybex Leg Press 40# BLE 2x10; 20# R LE 2x10   ? Other Machine knee ext 10# R LE   ?  ? Knee/Hip Exercises: Standing  ? Terminal Knee Extension AROM;Strengthening;Right;2 sets;10 reps   ? Theraband Level (Terminal Knee Extension) Level 4 (Blue)   ?  ? Knee/Hip Exercises: Seated  ? Hamstring Curl AROM;Strengthening;Right;2 sets;10 reps   ? Hamstring Limitations blue TB; 10 reps hamstring  curls R LE w/ red TB   ? ?  ?  ? ?  ? ? ? ? ? ? ? ? ? ? ? ? PT Short Term Goals - 01/04/22 1754   ? ?  ? PT SHORT TERM GOAL #2  ? Title R knee ROM to be no more than 8 degrees extension and will be at least 100 degrees flexion   ? Baseline 12-111   ? Time 2   ? Period Weeks   ? Status On-going   ? ?  ?  ? ?  ? ? ? ? PT Long Term Goals - 01/04/22 1751   ? ?  ? PT LONG TERM GOAL #1  ? Title MMT to improve by at least 1 grade in all weak groups   ? Baseline 5/5 knee flex/ext. WNL   ? Time 6   ? Period Weeks   ? Status Partially Met   ? Target Date 12/21/21   ?  ? PT LONG TERM GOAL #2  ? Period Weeks   ? ?  ?  ? ?  ? ? ? ? ? ? ? ? Plan - 01/11/22 1617   ? ? Clinical Impression Statement Patient came in w/ stiffness of R knee. ROM of R knee was 4-118 degrees of flexion. Focused on strengthening for today's sesssion. VC's and demonstration for correct posture needed for TKE.   ? Personal Factors and Comorbidities Fitness;Past/Current Experience;Comorbidity 1;Time since onset of  injury/illness/exacerbation   ? Examination-Activity Limitations Locomotion Level;Transfers;Bed Mobility;Bend;Sit;Sleep;Squat;Stairs;Lift;Stand   ? Examination-Participation Restrictions Community Activity;Driving;Shop;Laundry;[employer] Work   ? Stability/Clinical Decision Making Stable/Uncomplicated   ? Clinical Decision Making Low   ? Rehab Potential Good   ? PT Frequency 3x / week   ? PT Duration 6 weeks   ? PT Treatment/Interventions Cryotherapy;Moist Heat;Vasopneumatic Device;Gait training;Electrical Stimulation;Iontophoresis 40m/ml Dexamethasone;Therapeutic exercise;Ultrasound;Manual techniques;Neuromuscular re-education;Therapeutic activities;ADLs/Self Care Home Management   ? PT Next Visit Plan progress as tolerated   ? PT Home Exercise Plan still on hospital HEP   ? Consulted and Agree with Plan of Care Patient   ? ?  ?  ? ?  ? ? ?Patient will benefit from skilled therapeutic intervention in order to improve the following deficits and impairments:  Abnormal gait, Decreased range of motion, Difficulty walking, Obesity, Increased muscle spasms, Decreased activity tolerance, Decreased knowledge of precautions, Pain, Decreased balance, Decreased knowledge of use of DME, Decreased scar mobility, Hypomobility, Impaired flexibility, Decreased mobility, Decreased strength, Postural dysfunction, Impaired sensation, Increased edema ? ?Visit Diagnosis: ?Stiffness of right knee, not elsewhere classified ? ?Difficulty in walking, not elsewhere classified ? ? ? ? ?Problem List ?Patient Active Problem List  ? Diagnosis Date Noted  ? OA (osteoarthritis) of knee 12/19/2020  ? Primary osteoarthritis of left knee 12/19/2020  ? Obstructive sleep apnea 06/19/2019  ? Malignant neoplasm of overlapping sites of right breast in female, estrogen receptor positive ([facility] 05/14/2019  ? Coagulopathy ([facility] 05/14/2019  ? Morbid obesity ([facility] 05/14/2019  ? Chronic thromboembolic disease ([place] 044/11/4740 ? Pulmonary vascular disease ([facility]  04/08/2019  ? Hypothyroidism 04/07/2019  ? Essential hypertension 04/07/2019  ? Pulmonary embolism ([facility] 04/06/2019  ? Abnormal liver function 04/06/2019  ? Hypokalemia 04/06/2019  ? Incisional hernia 04/06/2016  ? ? ?Borna Wessinger PChauncey Cruel?01/11/2022, 4:25 PM ? ?[place] ?[address] ?[place] [address]?Phone: [phone]  Fax:  [phone]? ?Name: JJanne [name]?MRN: [id number]?Date of Birth: 8Jan 05, 1947? ? ? ?

## 2022-01-18 ENCOUNTER — Ambulatory Visit: Payer: Medicare HMO | Admitting: Physical Therapy

## 2022-01-18 ENCOUNTER — Encounter: Payer: Self-pay | Admitting: Physical Therapy

## 2022-01-18 DIAGNOSIS — R262 Difficulty in walking, not elsewhere classified: Secondary | ICD-10-CM | POA: Diagnosis not present

## 2022-01-18 DIAGNOSIS — M25661 Stiffness of right knee, not elsewhere classified: Secondary | ICD-10-CM

## 2022-01-18 DIAGNOSIS — M25561 Pain in right knee: Secondary | ICD-10-CM | POA: Diagnosis not present

## 2022-01-18 NOTE — Therapy (Signed)
[facility] ?[address] ?[place], [place], [address] ?Phone: [phone]   Fax:  [phone] ? ?Physical Therapy Treatment ? ?Patient Details  ?Name: Cassandra [name] ?MRN: [id number] ?Date of Birth: 24-Feb-1946 ?Referring Provider (PT): Aluisio ? ? ?Encounter Date: 01/18/2022 ? ? PT End of Session - 01/18/22 1618   ? ? Visit Number 21   ? Number of Visits 19   ? Date for PT Re-Evaluation 12/21/21   ? Authorization Type [employer]   ? Authorization Time Period 11/09/21 to 12/21/21   ? Progress Note Due on Visit 20   ? PT Start Time 1540   patient was 10 mins late  ? PT Stop Time 5956   ? PT Time Calculation (min) 34 min   ? Activity Tolerance Patient tolerated treatment well   ? Behavior During Therapy [facility] for tasks assessed/performed   ? ?  ?  ? ?  ? ? ?Past Medical History:  ?Diagnosis Date  ? Arthritis   ? Cancer [facility])   ? Breast cancer right '06- surgery, chemo, radiation- released after 5 yrs  ? Dyspnea   ? in 2020 had pe placed on eliquis  ? Hx of seasonal allergies   ? OTC med used  ? Hypertension   ? Hypothyroidism   ? Pulmonary embolism ([place])   ? hx of in 2020 placed on eliquis  ? Sleep apnea   ? cpap  ? ? ?Past Surgical History:  ?Procedure Laterality Date  ? BREAST SURGERY Right   ? modified partial mastectomy/ lymph nodes dissection  ? COLON SURGERY  2/17  ? INCISIONAL HERNIA REPAIR N/A 04/06/2016  ? Procedure: LAPAROSCOPIC ASSISTED INCISIONAL HERNIA REPAIR WITH MESH;  Surgeon: Leighton Ruff, MD;  [facility];  Service: General;  Laterality: N/A;  ? TONSILLECTOMY    ? TOTAL KNEE ARTHROPLASTY Left 12/19/2020  ? Procedure: TOTAL KNEE ARTHROPLASTY;  Surgeon: Gaynelle Arabian, MD;  [facility];  Service: Orthopedics;  Laterality: Left;  4mn  ? TOTAL KNEE ARTHROPLASTY Right 11/06/2021  ? Procedure: TOTAL KNEE ARTHROPLASTY;  Surgeon: AGaynelle Arabian MD;  [facility];  Service: Orthopedics;  Laterality: Right;  ? ? ?There were no vitals filed for this  visit. ? ? Subjective Assessment - 01/18/22 1544   ? ? Subjective I'm stiff on both knees but I'm doing good   ? Patient Stated Goals get moving better, get pain down   ? Currently in Pain? Yes   ? Pain Score 3    ? Pain Location Knee   ? Pain Orientation Right   ? Pain Descriptors / Indicators Sore;Aching   ? ?  ?  ? ?  ? ? ? ? ? ? ? ? ? ? ? ? ? ? ? ? ? ? ? ? [facility]/Exercise - 01/18/22 0001   ? ?  ? Knee/Hip Exercises: Stretches  ? Other Knee/Hip Stretches knee flex and ext 20 secs x 3   ?  ? Knee/Hip Exercises: Aerobic  ? Recumbent Bike lvl 2 x 6 mins   ?  ? Knee/Hip Exercises: Machines for Strengthening  ? Cybex Knee Flexion 20# 2x10 R LE   ?  ? Knee/Hip Exercises: Standing  ? Terminal Knee Extension AROM;Strengthening;Right;2 sets;10 reps   ? Theraband Level (Terminal Knee Extension) Level 4 (Blue)   ? Lateral Step Up Both;10 reps;1 set   ? Lateral Step Up Limitations 6' step   ? Forward Step Up Both;1 set;10  reps   ? Forward Step Up Limitations 6' step   ?  ? Knee/Hip Exercises: Seated  ? Hamstring Curl AROM;Strengthening;Right;2 sets;10 reps   ? Hamstring Limitations blue TB   ? Sit to Sand 2 sets;10 reps;without UE support   on airex  ? ?  ?  ? ?  ? ? ? ? ? ? ? ? ? ? ? ? PT Short Term Goals - 01/18/22 1610   ? ?  ? PT SHORT TERM GOAL #1  ? Title Will be independent iwth appropriate progressive HEP   ? Baseline Patient reports inconsistent performance   ? Time 2   ? Period Weeks   ? Status On-going   ? Target Date 11/30/21   ?  ? PT SHORT TERM GOAL #3  ? Title Gait pattern will improve to consistent heel-toe, step through pattern with upright posture, even step and stride lengths, and minimal unsteadiness with LRAD   ? Time 2   ? Period Weeks   ? Status On-going   ? ?  ?  ? ?  ? ? ? ? PT Long Term Goals - 01/18/22 1612   ? ?  ? PT LONG TERM GOAL #1  ? Title MMT to improve by at least 1 grade in all weak groups   ? Baseline 5/5 knee flex/ext. WNL   ? Time 6   ? Period Weeks   ? Status Partially  Met   ? Target Date 12/21/21   ?  ? PT LONG TERM GOAL #2  ? Title Will demonstrate R knee extension of no more than 5 degrees, and R knee flexion of at least 110 degrees   ? Baseline 110   ? Time 6   ? Period Weeks   ? Status Achieved   ? Target Date 02/16/21   ?  ? PT LONG TERM GOAL #3  ? Title Will be able to ascend and descend at least 4 stairs with U rail and step over step pattern without difficulty   ? Time 6   ? Period Weeks   ? Status Achieved   ? Target Date 02/16/21   ? ?  ?  ? ?  ? ? ? ? ? ? ? ? Plan - 01/18/22 1619   ? ? Clinical Impression Statement Patient came in w/ stiffness in both knees. Patient states that she is being active but not doing HEP. Focused on strengthening and stretching for today's session. Vc's needed for correct posture during TKE and fwd step ups.   ? Personal Factors and Comorbidities Fitness;Past/Current Experience;Comorbidity 1;Time since onset of injury/illness/exacerbation   ? Examination-Activity Limitations Locomotion Level;Transfers;Bed Mobility;Bend;Sit;Sleep;Squat;Stairs;Lift;Stand   ? Examination-Participation Restrictions Community Activity;Driving;Shop;Laundry;[employer] Work   ? Stability/Clinical Decision Making Stable/Uncomplicated   ? Clinical Decision Making Low   ? Rehab Potential Good   ? PT Frequency 3x / week   ? PT Duration 6 weeks   ? PT Treatment/Interventions Cryotherapy;Moist Heat;Vasopneumatic Device;Gait training;Electrical Stimulation;Iontophoresis 29m/ml Dexamethasone;Therapeutic exercise;Ultrasound;Manual techniques;Neuromuscular re-education;Therapeutic activities;ADLs/Self Care Home Management   ? PT Next Visit Plan discharge   ? PT Home Exercise Plan still on hospital HEP   ? Consulted and Agree with Plan of Care Patient   ? ?  ?  ? ?  ? ? ?Patient will benefit from skilled therapeutic intervention in order to improve the following deficits and impairments:  Abnormal gait, Decreased range of motion, Difficulty walking, Obesity, Increased muscle spasms,  Decreased activity tolerance, Decreased knowledge of precautions, Pain, Decreased  balance, Decreased knowledge of use of DME, Decreased scar mobility, Hypomobility, Impaired flexibility, Decreased mobility, Decreased strength, Postural dysfunction, Impaired sensation, Increased edema ? ?Visit Diagnosis: ?Stiffness of right knee, not elsewhere classified ? ?Difficulty in walking, not elsewhere classified ? ?Acute pain of right knee ? ? ? ? ?Problem List ?Patient Active Problem List  ? Diagnosis Date Noted  ? OA (osteoarthritis) of knee 12/19/2020  ? Primary osteoarthritis of left knee 12/19/2020  ? Obstructive sleep apnea 06/19/2019  ? Malignant neoplasm of overlapping sites of right breast in female, estrogen receptor positive ([place]) 05/14/2019  ? Coagulopathy ([place]) 05/14/2019  ? Morbid obesity ([place]) 05/14/2019  ? Chronic thromboembolic disease ([place]) 99/23/4144  ? Pulmonary vascular disease ([place]) 04/08/2019  ? Hypothyroidism 04/07/2019  ? Essential hypertension 04/07/2019  ? Pulmonary embolism ([place]) 04/06/2019  ? Abnormal liver function 04/06/2019  ? Hypokalemia 04/06/2019  ? Incisional hernia 04/06/2016  ? ? ?Donnetta Gillin Chauncey Cruel ?01/18/2022, 4:29 PM ? ?[place] ?[address] ?[place], [place], [address] ?Phone: [phone]   Fax:  [phone] ? ?Name: Cassandra [name] ?MRN: [id number] ?Date of Birth: 1946-05-20 ? ? ? ?

## 2022-01-25 ENCOUNTER — Encounter: Payer: Self-pay | Admitting: Physical Therapy

## 2022-01-25 ENCOUNTER — Ambulatory Visit: Payer: Medicare HMO | Attending: Orthopedic Surgery | Admitting: Physical Therapy

## 2022-01-25 DIAGNOSIS — R262 Difficulty in walking, not elsewhere classified: Secondary | ICD-10-CM | POA: Insufficient documentation

## 2022-01-25 DIAGNOSIS — R600 Localized edema: Secondary | ICD-10-CM | POA: Insufficient documentation

## 2022-01-25 DIAGNOSIS — M25661 Stiffness of right knee, not elsewhere classified: Secondary | ICD-10-CM | POA: Insufficient documentation

## 2022-01-25 DIAGNOSIS — M25561 Pain in right knee: Secondary | ICD-10-CM | POA: Diagnosis not present

## 2022-01-25 NOTE — Therapy (Addendum)
[facility] ?[address] ?[place], [place], [address] ?Phone: [phone]   Fax:  [phone] ? ?Physical Therapy Treatment ? ?Patient Details  ?Name: Cassandra [name] ?MRN: [id number] ?Date of Birth: 1946-01-23 ?Referring Provider (PT): Aluisio ? ? ?Encounter Date: 01/25/2022 ? ? PT End of Session - 01/25/22 1648   ? ? Visit Number 22   ? Number of Visits 19   ? Date for PT Re-Evaluation 12/21/21   ? Authorization Type [employer]   ? Authorization Time Period 11/09/21 to 12/21/21   ? Progress Note Due on Visit 20   ? PT Start Time 1530   ? PT Stop Time 6314   ? PT Time Calculation (min) 43 min   ? Activity Tolerance Patient tolerated treatment well   ? Behavior During Therapy [facility] for tasks assessed/performed   ? ?  ?  ? ?  ? ? ?Past Medical History:  ?Diagnosis Date  ? Arthritis   ? Cancer [facility])   ? Breast cancer right '06- surgery, chemo, radiation- released after 5 yrs  ? Dyspnea   ? in 2020 had pe placed on eliquis  ? Hx of seasonal allergies   ? OTC med used  ? Hypertension   ? Hypothyroidism   ? Pulmonary embolism ([place])   ? hx of in 2020 placed on eliquis  ? Sleep apnea   ? cpap  ? ? ?Past Surgical History:  ?Procedure Laterality Date  ? BREAST SURGERY Right   ? modified partial mastectomy/ lymph nodes dissection  ? COLON SURGERY  2/17  ? INCISIONAL HERNIA REPAIR N/A 04/06/2016  ? Procedure: LAPAROSCOPIC ASSISTED INCISIONAL HERNIA REPAIR WITH MESH;  Surgeon: Leighton Ruff, MD;  [facility];  Service: General;  Laterality: N/A;  ? TONSILLECTOMY    ? TOTAL KNEE ARTHROPLASTY Left 12/19/2020  ? Procedure: TOTAL KNEE ARTHROPLASTY;  Surgeon: Gaynelle Arabian, MD;  [facility];  Service: Orthopedics;  Laterality: Left;  34mn  ? TOTAL KNEE ARTHROPLASTY Right 11/06/2021  ? Procedure: TOTAL KNEE ARTHROPLASTY;  Surgeon: AGaynelle Arabian MD;  [facility];  Service: Orthopedics;  Laterality: Right;  ? ? ?There were no vitals filed for this visit. ? ? Subjective  Assessment - 01/25/22 1532   ? ? Subjective I'm doing good. I went to the doctor and he said I'm doing great.   ? Currently in Pain? Yes   ? Pain Score 3    ? Pain Location Knee   ? Pain Orientation Right   ? Pain Descriptors / Indicators Sore;Aching   ? ?  ?  ? ?  ? ? ? ? ? ? ? ? ? ? ? ? ? ? ? ? ? ? ? ? [facility]/Exercise - 01/25/22 0001   ? ?  ? Ambulation/Gait  ? Gait Comments gait outside building 500 ft w/ 4 curbs   ?  ? Knee/Hip Exercises: Aerobic  ? Nustep lvl 6 x 6 mins   ?  ? Knee/Hip Exercises: Machines for Strengthening  ? Cybex Knee Extension 2x10 B LE 20#; 2x10 R LE only 5#   ? Cybex Knee Flexion 25# 2x10 B LE; 15# 2x10 R LE only   ? Cybex Leg Press 60# 2x10 B LE; 40# 2x10 R LE   ?  ? Knee/Hip Exercises: Standing  ? Lateral Step Up Both;1 set;10 reps   ? Lateral Step Up Limitations 6' step   ? Forward Step Up Both;2 sets;5 reps   ?  Forward Step Up Limitations 6' step   ?  ? Manual Therapy  ? Passive ROM Knee flex and ext 30 secs x3   ? ?  ?  ? ?  ? ? ? ? ? ? ? ? ? ? ? ? PT Short Term Goals - 01/25/22 1653   ? ?  ? PT SHORT TERM GOAL #1  ? Title Will be independent iwth appropriate progressive HEP   ? Baseline Patient reports inconsistent performance   ? Time 2   ? Period Weeks   ? Status On-going   ? Target Date 11/30/21   ?  ? PT SHORT TERM GOAL #2  ? Title R knee ROM to be no more than 8 degrees extension and will be at least 100 degrees flexion   ? Baseline 2-118   ? Time 2   ? Period Weeks   ? Status Achieved   ?  ? PT SHORT TERM GOAL #3  ? Title Gait pattern will improve to consistent heel-toe, step through pattern with upright posture, even step and stride lengths, and minimal unsteadiness with LRAD   ? Time 2   ? Period Weeks   ? Status Achieved   ?  ? PT SHORT TERM GOAL #4  ? Title Will be independent with edema management strategies   ? Time 2   ? Period Weeks   ? Status Achieved   ? ?  ?  ? ?  ? ? ? ? PT Long Term Goals - 01/25/22 1557   ? ?  ? PT LONG TERM GOAL #1  ? Title MMT  to improve by at least 1 grade in all weak groups   ? Baseline 5/5 knee flex/ext. WNL   ? Time 6   ? Period Weeks   ? Status Achieved   ? Target Date 12/21/21   ?  ? PT LONG TERM GOAL #3  ? Title Will be able to ascend and descend at least 4 stairs with U rail and step over step pattern without difficulty   ? Time 6   ? Period Weeks   ? Status Achieved   ? Target Date 02/16/21   ? ?  ?  ? ?  ? ? ? ? ? ? ? ? Plan - 01/25/22 1659   ? ? Clinical Impression Statement Patient came in stating she was stiff in her R knee. 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 Focused on strengthening R knee. R knee 2-118 flexion. Discharged patient. She stated she went to the doctor and they said she was doing great.   ? Personal Factors and Comorbidities Fitness;Past/Current Experience;Comorbidity 1;Time since onset of injury/illness/exacerbation   ? Examination-Activity Limitations Locomotion Level;Transfers;Bed Mobility;Bend;Sit;Sleep;Squat;Stairs;Lift;Stand   ? Examination-Participation Restrictions Community Activity;Driving;Shop;Laundry;[employer] Work   ? Stability/Clinical Decision Making Stable/Uncomplicated   ? Clinical Decision Making Low   ? Rehab Potential Good   ? PT Frequency 3x / week   ? PT Duration 6 weeks   ? PT Treatment/Interventions Cryotherapy;Moist Heat;Vasopneumatic Device;Gait training;Electrical Stimulation;Iontophoresis 62m/ml Dexamethasone;Therapeutic exercise;Ultrasound;Manual techniques;Neuromuscular re-education;Therapeutic activities;ADLs/Self Care Home Management   ? PT Next Visit Plan discharged patient   ? PT Home Exercise Plan still on hospital HEP   ? Consulted and Agree with Plan of Care Patient   ? ?  ?  ? ?  ? ? ?Patient will benefit from skilled therapeutic intervention in order to improve the following  deficits and impairments:  Abnormal gait, Decreased range of motion, Difficulty walking, Obesity, Increased muscle spasms,  Decreased activity tolerance, Decreased knowledge of precautions, Pain, Decreased balance, Decreased knowledge of use of DME, Decreased scar mobility, Hypomobility, Impaired flexibility, Decreased mobility, Decreased strength, Postural dysfunction, Impaired sensation, Increased edema ? ?Visit Diagnosis: ?Stiffness of right knee, not elsewhere classified ? ?Difficulty in walking, not elsewhere classified ? ?Acute pain of right knee ? ?Localized edema ? ? ? ? ?Problem List ?Patient Active Problem List  ? Diagnosis Date Noted  ? OA (osteoarthritis) of knee 12/19/2020  ? Primary osteoarthritis of left knee 12/19/2020  ? Obstructive sleep apnea 06/19/2019  ? Malignant neoplasm of overlapping sites of right breast in female, estrogen receptor positive ([place]) 05/14/2019  ? Coagulopathy ([place]) 05/14/2019  ? Morbid obesity ([place]) 05/14/2019  ? Chronic thromboembolic disease ([place]) 56/31/4970  ? Pulmonary vascular disease ([place]) 04/08/2019  ? Hypothyroidism 04/07/2019  ? Essential hypertension 04/07/2019  ? Pulmonary embolism ([place]) 04/06/2019  ? Abnormal liver function 04/06/2019  ? Hypokalemia 04/06/2019  ? Incisional hernia 04/06/2016  ? ?PHYSICAL THERAPY DISCHARGE SUMMARY ? ? ?Patient agrees to discharge. Patient goals were met. Patient is being discharged due to meeting the stated rehab goals. ? ?Allyson Sabal ?01/25/2022, 5:08 PM ? ? ?[place] ?[address] ?[place], [place], [address] ?Phone: [phone]   Fax:  [phone] ? ?Name: Cassandra [name] ?MRN: [id number] ?Date of Birth: Nov 21, 1945 ? ? ? ?

## 2022-03-21 DIAGNOSIS — R7303 Prediabetes: Secondary | ICD-10-CM | POA: Diagnosis not present

## 2022-03-21 DIAGNOSIS — I10 Essential (primary) hypertension: Secondary | ICD-10-CM | POA: Diagnosis not present

## 2022-03-21 DIAGNOSIS — G4734 Idiopathic sleep related nonobstructive alveolar hypoventilation: Secondary | ICD-10-CM | POA: Diagnosis not present

## 2022-03-21 DIAGNOSIS — Z7901 Long term (current) use of anticoagulants: Secondary | ICD-10-CM | POA: Diagnosis not present

## 2022-03-21 DIAGNOSIS — G4700 Insomnia, unspecified: Secondary | ICD-10-CM | POA: Diagnosis not present

## 2022-03-21 DIAGNOSIS — E785 Hyperlipidemia, unspecified: Secondary | ICD-10-CM | POA: Diagnosis not present

## 2022-03-21 DIAGNOSIS — M8588 Other specified disorders of bone density and structure, other site: Secondary | ICD-10-CM | POA: Diagnosis not present

## 2022-03-21 DIAGNOSIS — E039 Hypothyroidism, unspecified: Secondary | ICD-10-CM | POA: Diagnosis not present

## 2022-03-21 DIAGNOSIS — Z0000 Encounter for general adult medical examination without abnormal findings: Secondary | ICD-10-CM | POA: Diagnosis not present

## 2022-04-23 ENCOUNTER — Ambulatory Visit: Payer: Self-pay | Admitting: Surgery

## 2022-04-23 DIAGNOSIS — K432 Incisional hernia without obstruction or gangrene: Secondary | ICD-10-CM | POA: Diagnosis not present

## 2022-04-23 DIAGNOSIS — E669 Obesity, unspecified: Secondary | ICD-10-CM | POA: Diagnosis not present

## 2022-04-27 ENCOUNTER — Other Ambulatory Visit: Payer: Self-pay | Admitting: Surgery

## 2022-04-27 DIAGNOSIS — K432 Incisional hernia without obstruction or gangrene: Secondary | ICD-10-CM

## 2022-05-24 ENCOUNTER — Other Ambulatory Visit: Payer: Medicare HMO

## 2022-05-24 ENCOUNTER — Ambulatory Visit
Admission: RE | Admit: 2022-05-24 | Discharge: 2022-05-24 | Disposition: A | Discharge status: Home / Self Care | Payer: Medicare HMO | Source: Ambulatory Visit | Attending: Surgery | Admitting: Surgery

## 2022-05-24 DIAGNOSIS — K760 Fatty (change of) liver, not elsewhere classified: Secondary | ICD-10-CM | POA: Diagnosis not present

## 2022-05-24 DIAGNOSIS — I700 Atherosclerosis of aorta: Secondary | ICD-10-CM | POA: Diagnosis not present

## 2022-05-24 DIAGNOSIS — E669 Obesity, unspecified: Secondary | ICD-10-CM | POA: Diagnosis not present

## 2022-05-24 DIAGNOSIS — K432 Incisional hernia without obstruction or gangrene: Secondary | ICD-10-CM

## 2022-05-24 DIAGNOSIS — Z853 Personal history of malignant neoplasm of breast: Secondary | ICD-10-CM | POA: Diagnosis not present

## 2022-05-24 DIAGNOSIS — K429 Umbilical hernia without obstruction or gangrene: Secondary | ICD-10-CM | POA: Diagnosis not present

## 2022-05-24 MED ORDER — IOPAMIDOL (ISOVUE-300) INJECTION 61%
100.0000 mL | Freq: Once | INTRAVENOUS | Status: AC | PRN
  Administered 2022-05-24: 100 mL via INTRAVENOUS

## 2022-05-25 DIAGNOSIS — G4733 Obstructive sleep apnea (adult) (pediatric): Secondary | ICD-10-CM | POA: Diagnosis not present

## 2022-05-29 ENCOUNTER — Encounter: Payer: Self-pay | Admitting: Surgery

## 2022-06-13 DIAGNOSIS — Z1231 Encounter for screening mammogram for malignant neoplasm of breast: Secondary | ICD-10-CM | POA: Diagnosis not present

## 2022-06-15 ENCOUNTER — Other Ambulatory Visit: Payer: Self-pay | Admitting: Surgery

## 2022-06-15 DIAGNOSIS — K769 Liver disease, unspecified: Secondary | ICD-10-CM

## 2022-06-15 DIAGNOSIS — N859 Noninflammatory disorder of uterus, unspecified: Secondary | ICD-10-CM

## 2022-06-15 DIAGNOSIS — K7689 Other specified diseases of liver: Secondary | ICD-10-CM

## 2022-06-19 ENCOUNTER — Ambulatory Visit
Admission: RE | Admit: 2022-06-19 | Discharge: 2022-06-19 | Disposition: A | Discharge status: Home / Self Care | Payer: Medicare HMO | Source: Ambulatory Visit | Attending: Surgery | Admitting: Surgery

## 2022-06-19 DIAGNOSIS — N858 Other specified noninflammatory disorders of uterus: Secondary | ICD-10-CM | POA: Diagnosis not present

## 2022-06-19 DIAGNOSIS — K7689 Other specified diseases of liver: Secondary | ICD-10-CM

## 2022-06-19 DIAGNOSIS — K769 Liver disease, unspecified: Secondary | ICD-10-CM

## 2022-06-19 DIAGNOSIS — K760 Fatty (change of) liver, not elsewhere classified: Secondary | ICD-10-CM | POA: Diagnosis not present

## 2022-06-19 DIAGNOSIS — N859 Noninflammatory disorder of uterus, unspecified: Secondary | ICD-10-CM

## 2022-06-19 MED ORDER — GADOBENATE DIMEGLUMINE 529 MG/ML IV SOLN
19.0000 mL | Freq: Once | INTRAVENOUS | Status: AC | PRN
  Administered 2022-06-19: 19 mL via INTRAVENOUS

## 2022-06-21 ENCOUNTER — Encounter: Payer: Self-pay | Admitting: Surgery

## 2022-06-23 DIAGNOSIS — E669 Obesity, unspecified: Secondary | ICD-10-CM | POA: Diagnosis not present

## 2022-06-26 DIAGNOSIS — Z780 Asymptomatic menopausal state: Secondary | ICD-10-CM | POA: Diagnosis not present

## 2022-06-26 DIAGNOSIS — M810 Age-related osteoporosis without current pathological fracture: Secondary | ICD-10-CM | POA: Diagnosis not present

## 2022-06-26 DIAGNOSIS — M8589 Other specified disorders of bone density and structure, multiple sites: Secondary | ICD-10-CM | POA: Diagnosis not present

## 2022-07-24 DIAGNOSIS — E669 Obesity, unspecified: Secondary | ICD-10-CM | POA: Diagnosis not present

## 2022-08-13 ENCOUNTER — Ambulatory Visit: Payer: Self-pay | Admitting: Surgery

## 2022-08-13 NOTE — Progress Notes (Signed)
Surgical Instructions    Your procedure is scheduled on Tuesday, 08/21/22.  Report to [facility] Main Entrance "A" at 9:45 A.M., then check in with the Admitting office.  Call this number if you have problems the morning of surgery:  [phone]   If you have any questions prior to your surgery date call [phone]: Open Monday-Friday 8am-4pm If you experience any cold or flu symptoms such as cough, fever, chills, shortness of breath, etc. between now and your scheduled surgery, please notify us at the above number     Remember:  Do not eat after midnight the night before your surgery  You may drink clear liquids until 8:45am the morning of your surgery.   Clear liquids allowed are: Water, Non-Citrus Juices (without pulp), Carbonated Beverages, Clear Tea, Black Coffee ONLY (NO MILK, CREAM OR POWDERED CREAMER of any kind), and Gatorade    Take these medicines the morning of surgery with A SIP OF WATER:  levothyroxine (SYNTHROID, LEVOTHROID)  loratadine (CLARITIN)  NIFEdipine (ADALAT CC)   IF NEEDED: acetaminophen (TYLENOL)   As of today, STOP taking any Aspirin (unless otherwise instructed by your surgeon) Aleve, Naproxen, Ibuprofen, Motrin, Advil, Goody's, BC's, all herbal medications, fish oil, and all vitamins.  Please follow your surgeons instructions in regards to stopping apixaban (ELIQUIS). If no instructions were given to you, please contact the surgeons office.            Do not wear jewelry or makeup. Do not wear lotions, powders, perfumes or deodorant. Do not shave 48 hours prior to surgery.   Do not bring valuables to the hospital. Do not wear nail polish, gel polish, artificial nails, or any other type of covering on natural nails (fingers and toes) If you have artificial nails or gel coating that need to be removed by a nail salon, please have this removed prior to surgery. Artificial nails or gel coating may interfere with anesthesia's ability to adequately  monitor your vital signs.  [facility] is not responsible for any belongings or valuables.    Do NOT Smoke (Tobacco/Vaping)  24 hours prior to your procedure  If you use a CPAP at night, you may bring your mask for your overnight stay.   Contacts, glasses, hearing aids, dentures or partials may not be worn into surgery, please bring cases for these belongings   For patients admitted to the hospital, discharge time will be determined by your treatment team.   Patients discharged the day of surgery will not be allowed to drive home, and someone needs to stay with them for 24 hours.   SURGICAL WAITING ROOM VISITATION Patients having surgery or a procedure may have no more than 2 support people in the waiting area - these visitors may rotate.   Children under the age of 38 must have an adult with them who is not the patient. If the patient needs to stay at the hospital during part of their recovery, the visitor guidelines for inpatient rooms apply. Pre-op nurse will coordinate an appropriate time for 1 support person to accompany patient in pre-op.  This support person may not rotate.   Please refer to [email or web address] for the visitor guidelines for Inpatients (after your surgery is over and you are in a regular room).    Special instructions:    Oral Hygiene is also important to reduce your risk of infection.  Remember - BRUSH YOUR TEETH THE MORNING OF SURGERY WITH YOUR REGULAR TOOTHPASTE   [facility]- Preparing For Surgery  Before surgery, you can play an important role. Because skin is not sterile, your skin needs to be as free of germs as possible. You can reduce the number of germs on your skin by washing with CHG (chlorahexidine gluconate) Soap before surgery.  CHG is an antiseptic cleaner which kills germs and bonds with the skin to continue killing germs even after washing.     Please do not use if you have an allergy to CHG  or antibacterial soaps. If your skin becomes reddened/irritated stop using the CHG.  Do not shave (including legs and underarms) for at least 48 hours prior to first CHG shower. It is OK to shave your face.  Please follow these instructions carefully.     Shower the NIGHT BEFORE SURGERY and the MORNING OF SURGERY with CHG Soap.   If you chose to wash your hair, wash your hair first as usual with your normal shampoo. After you shampoo, rinse your hair and body thoroughly to remove the shampoo.  Then [employer] and genitals (private parts) with your normal soap and rinse thoroughly to remove soap.  After that Use CHG Soap as you would any other liquid soap. You can apply CHG directly to the skin and wash gently with a scrungie or a clean washcloth.   Apply the CHG Soap to your body ONLY FROM THE NECK DOWN.  Do not use on open wounds or open sores. Avoid contact with your eyes, ears, mouth and genitals (private parts). Wash Face and genitals (private parts)  with your normal soap.   Wash thoroughly, paying special attention to the area where your surgery will be performed.  Thoroughly rinse your body with warm water from the neck down.  DO NOT shower/wash with your normal soap after using and rinsing off the CHG Soap.  Pat yourself dry with a CLEAN TOWEL.  Wear CLEAN PAJAMAS to bed the night before surgery  Place CLEAN SHEETS on your bed the night before your surgery  DO NOT SLEEP WITH PETS.   Day of Surgery: Take a shower with CHG soap. Wear Clean/Comfortable clothing the morning of surgery Do not apply any deodorants/lotions.   Remember to brush your teeth WITH YOUR REGULAR TOOTHPASTE.    If you received a COVID test during your pre-op visit, it is requested that you wear a mask when out in public, stay away from anyone that may not be feeling well, and notify your surgeon if you develop symptoms. If you have been in contact with anyone that has tested positive in the last 10 days,  please notify your surgeon.    Please read over the following fact sheets that you were given.

## 2022-08-14 ENCOUNTER — Other Ambulatory Visit: Payer: Self-pay

## 2022-08-14 ENCOUNTER — Encounter (HOSPITAL_COMMUNITY)
Admission: RE | Admit: 2022-08-14 | Discharge: 2022-08-14 | Disposition: A | Discharge status: Home / Self Care | Payer: Medicare HMO | Source: Ambulatory Visit | Attending: Surgery | Admitting: Surgery

## 2022-08-14 VITALS — BP 119/75 | HR 58 | Temp 97.6°F | Resp 17 | Ht 60.0 in | Wt 206.2 lb

## 2022-08-14 DIAGNOSIS — Z01812 Encounter for preprocedural laboratory examination: Secondary | ICD-10-CM | POA: Insufficient documentation

## 2022-08-14 DIAGNOSIS — I10 Essential (primary) hypertension: Secondary | ICD-10-CM | POA: Insufficient documentation

## 2022-08-14 LAB — BASIC METABOLIC PANEL
Anion gap: 12 (ref 5–15)
BUN: 14 mg/dL (ref 8–23)
CO2: 24 mmol/L (ref 22–32)
Calcium: 10.1 mg/dL (ref 8.9–10.3)
Chloride: 103 mmol/L (ref 98–111)
Creatinine, Ser: 0.87 mg/dL (ref 0.44–1.00)
GFR, Estimated: >60 mL/min (ref >60)
Glucose, Bld: 93 mg/dL (ref 70–99)
Potassium: 4.2 mmol/L (ref 3.5–5.1)
Sodium: 139 mmol/L (ref 135–145)

## 2022-08-14 LAB — CBC
HCT: 44 % (ref 36.0–46.0)
Hemoglobin: 14.1 g/dL (ref 12.0–15.0)
MCH: 31.5 pg (ref 26.0–34.0)
MCHC: 32 g/dL (ref 30.0–36.0)
MCV: 98.2 fL (ref 80.0–100.0)
Platelets: 335 10*3/uL (ref 150–400)
RBC: 4.48 MIL/uL (ref 3.87–5.11)
RDW: 14.5 % (ref 11.5–15.5)
WBC: 8.3 10*3/uL (ref 4.0–10.5)
nRBC: 0 % (ref 0.0–0.2)

## 2022-08-14 NOTE — Pre-Procedure Instructions (Signed)
Surgical Instructions    Your procedure is scheduled on Tuesday November 28.  Report to [facility] Main Entrance "A" at 9:45 A.M., then check in with the Admitting office.  Call this number if you have problems the morning of surgery:  [phone]  If you have any questions prior to your surgery date call [phone]: Open Monday-Friday 8am-4pm  If you experience any cold, Covid, or flu symptoms such as cough, fever, chills, shortness of breath, etc. between now and your scheduled surgery, please notify us at the above number   Patient Instructions   The night before surgery:    No food after midnight. ONLY clear liquids after midnight   The day of surgery (if you do NOT have diabetes):    You may drink clear liquids until 8:45 the morning of your surgery.   Clear liquids allowed are: Water, Non-Citrus Juices (without pulp), Carbonated Beverages, Clear Tea, Black Coffee ONLY (NO MILK, CREAM OR POWDERED CREAMER of any kind), and Gatorade  Drink ONE (1) Pre-Surgery Clear Ensure by 8:45 the morning of surgery. Drink in one sitting. Do not sip.  This drink was given to you during your hospital pre-op appointment visit.   Nothing else to drink after completing the Pre-Surgery Clear Ensure.           If you have questions, please contact your surgeon's office.    ake these medicines the morning of surgery with A SIP OF WATER:  levothyroxine (SYNTHROID, LEVOTHROID)  loratadine (CLARITIN)  NIFEdipine (ADALAT CC)    IF NEEDED: acetaminophen (TYLENOL)    As of today, STOP taking any Aspirin (unless otherwise instructed by your surgeon) Aleve, Naproxen, Ibuprofen, Motrin, Advil, Goody's, BC's, all herbal medications, fish oil, and all vitamins.   Please follow your surgeons instructions in regards to stopping apixaban (ELIQUIS). If no instructions were given to you, please contact the surgeons office.         Do NOT Smoke (Tobacco/Vaping)  24 hours prior to your procedure  If you  use a CPAP at night, you may bring your mask for your overnight stay.   Contacts, glasses, hearing aids, dentures or partials may not be worn into surgery, please bring cases for these belongings   For patients admitted to the hospital, discharge time will be determined by your treatment team.   Patients discharged the day of surgery will not be allowed to drive home, and someone needs to stay with them for 24 hours.   SURGICAL WAITING ROOM VISITATION Patients having surgery or a procedure may have no more than 2 support people in the waiting area - these visitors may rotate.   Children under the age of 21 must have an adult with them who is not the patient. If the patient needs to stay at the hospital during part of their recovery, the visitor guidelines for inpatient rooms apply. Pre-op nurse will coordinate an appropriate time for 1 support person to accompany patient in pre-op.  This support person may not rotate.   Please refer to the [facility] website for the visitor guidelines for Inpatients (after your surgery is over and you are in a regular room).    Special instructions:    Oral Hygiene is also important to reduce your risk of infection.  Remember -  BRUSH YOUR TEETH THE MORNING OF SURGERY WITH YOUR REGULAR TOOTHPASTE   [facility]- Preparing For Surgery  Before surgery, you can play an important role. Because skin is not sterile, your skin needs  to be as free of germs as possible. You can reduce the number of germs on your skin by washing with CHG (chlorahexidine gluconate) Soap before surgery.  CHG is an antiseptic cleaner which kills germs and bonds with the skin to continue killing germs even after washing.     Please do not use if you have an allergy to CHG or antibacterial soaps. If your skin becomes reddened/irritated stop using the CHG.  Do not shave (including legs and underarms) for at least 48 hours prior to first CHG shower. It is OK to shave your face.  Please  follow these instructions carefully.    Shower the NIGHT BEFORE SURGERY and the MORNING OF SURGERY with CHG Soap.  If you chose to wash your hair, wash your hair first as usual with your normal shampoo.  After you shampoo, rinse your hair and body thoroughly to remove the shampoo.   Then [employer] and genitals (private parts) with your normal soap and rinse thoroughly to remove soap.  After that Use CHG Soap as you would any other liquid soap.  You can apply CHG directly to the skin and wash gently with a scrungie or a clean washcloth.   Apply the CHG Soap to your body ONLY FROM THE NECK DOWN.   Do not use on open wounds or open sores.  Avoid contact with your eyes, ears, mouth and genitals (private parts).  Wash thoroughly, paying special attention to the area where your surgery will be performed.  Thoroughly rinse your body with warm water from the neck down.  DO NOT shower/wash with your normal soap after using and rinsing off the CHG Soap.  Pat yourself dry with a CLEAN TOWEL.  Wear CLEAN PAJAMAS to bed the night before surgery  Place CLEAN SHEETS on your bed the night before your surgery  DO NOT SLEEP WITH PETS.   Day of Surgery:  Take a shower with CHG soap. Wear Clean/Comfortable clothing the morning of surgery Brush your teeth WITH YOUR REGULAR TOOTHPASTE. Do not wear jewelry or makeup. Do not wear lotions, powders, perfumes or deodorant. Do not shave 48 hours prior to surgery.  Do not bring valuables to the hospital.  [facility] is not responsible for any belongings or valuables.  Do not wear nail polish, gel polish, artificial nails, or any other type of covering on natural nails (fingers and toes) If you have artificial nails or gel coating that need to be removed by a nail salon, please have this removed prior to surgery. Artificial nails or gel coating may interfere with anesthesia's ability to adequately monitor your vital signs.  If you received a COVID test  during your pre-op visit, it is requested that you wear a mask when out in public, stay away from anyone that may not be feeling well, and notify your surgeon if you develop symptoms. If you have been in contact with anyone that has tested positive in the last 10 days, please notify your surgeon.    Please read over the following fact sheets that you were given.

## 2022-08-14 NOTE — Progress Notes (Signed)
PCP - Harlan Stains MD Cardiologist - denies  PPM/ICD - denies  Chest x-ray - n/a EKG - 10/26/21 Stress Test - denies ECHO - 06/18/19 Cardiac Cath - denies  Sleep Study - yes +OSA CPAP - yes  Not diabetic.  As of today, STOP taking any Aspirin (unless otherwise instructed by your surgeon) Aleve, Naproxen, Ibuprofen, Motrin, Advil, Goody's, BC's, all herbal medications, fish oil, and all vitamins.   ERAS Protcol - no PRE-SURGERY Ensure or G2- n/a  COVID TEST- n/a  Anesthesia review: n/a  Patient denies shortness of breath, fever, cough and chest pain at PAT appointment   All instructions explained to the patient, with a verbal understanding of the material. Patient agrees to go over the instructions while at home for a better understanding. The opportunity to ask questions was provided.

## 2022-08-21 ENCOUNTER — Encounter (HOSPITAL_COMMUNITY): Payer: Self-pay | Admitting: Surgery

## 2022-08-21 ENCOUNTER — Ambulatory Visit (HOSPITAL_COMMUNITY): Payer: Medicare HMO | Admitting: Certified Registered Nurse Anesthetist

## 2022-08-21 ENCOUNTER — Ambulatory Visit (HOSPITAL_BASED_OUTPATIENT_CLINIC_OR_DEPARTMENT_OTHER): Payer: Medicare HMO | Admitting: Certified Registered Nurse Anesthetist

## 2022-08-21 ENCOUNTER — Other Ambulatory Visit: Payer: Self-pay

## 2022-08-21 ENCOUNTER — Observation Stay (HOSPITAL_COMMUNITY)
Admission: RE | Admit: 2022-08-21 | Discharge: 2022-08-22 | Disposition: A | Discharge status: Home / Self Care | Payer: Medicare HMO | Attending: Surgery | Admitting: Surgery

## 2022-08-21 ENCOUNTER — Encounter (HOSPITAL_COMMUNITY)
Admission: RE | Disposition: A | Discharge status: Home / Self Care | Payer: Self-pay | Source: Home / Self Care | Attending: Surgery

## 2022-08-21 DIAGNOSIS — I10 Essential (primary) hypertension: Secondary | ICD-10-CM | POA: Insufficient documentation

## 2022-08-21 DIAGNOSIS — Z7901 Long term (current) use of anticoagulants: Secondary | ICD-10-CM | POA: Diagnosis not present

## 2022-08-21 DIAGNOSIS — K432 Incisional hernia without obstruction or gangrene: Secondary | ICD-10-CM | POA: Diagnosis present

## 2022-08-21 DIAGNOSIS — Z96659 Presence of unspecified artificial knee joint: Secondary | ICD-10-CM | POA: Diagnosis not present

## 2022-08-21 DIAGNOSIS — G8918 Other acute postprocedural pain: Secondary | ICD-10-CM | POA: Diagnosis not present

## 2022-08-21 DIAGNOSIS — Z79899 Other long term (current) drug therapy: Secondary | ICD-10-CM | POA: Diagnosis not present

## 2022-08-21 DIAGNOSIS — K430 Incisional hernia with obstruction, without gangrene: Principal | ICD-10-CM | POA: Diagnosis present

## 2022-08-21 HISTORY — PX: INCISIONAL HERNIA REPAIR: SHX193

## 2022-08-21 HISTORY — PX: INSERTION OF MESH: SHX5868

## 2022-08-21 SURGERY — REPAIR, HERNIA, INCISIONAL
Anesthesia: General | Site: Abdomen

## 2022-08-21 MED ORDER — CHLORHEXIDINE GLUCONATE 0.12 % MT SOLN: 15.0000 mL | Freq: Once | OROMUCOSAL | Status: AC

## 2022-08-21 MED ORDER — LORATADINE 10 MG PO TABS: 10.0000 mg | ORAL_TABLET | Freq: Every morning | ORAL | Status: DC

## 2022-08-21 MED ORDER — HYDROCHLOROTHIAZIDE 12.5 MG PO TABS: 6.2500 mg | ORAL_TABLET | Freq: Every day | ORAL | Status: DC

## 2022-08-21 MED ORDER — PROPOFOL 10 MG/ML IV BOLUS
INTRAVENOUS | Status: DC | PRN
  Administered 2022-08-21: 100 mg via INTRAVENOUS

## 2022-08-21 MED ORDER — CHLORHEXIDINE GLUCONATE CLOTH 2 % EX PADS: 6.0000 | MEDICATED_PAD | Freq: Once | CUTANEOUS | Status: DC

## 2022-08-21 MED ORDER — DEXTROSE 5 % IV SOLN
INTRAVENOUS | Status: DC | PRN
  Administered 2022-08-21: 3 g via INTRAVENOUS

## 2022-08-21 MED ORDER — DIPHENHYDRAMINE HCL 50 MG/ML IJ SOLN: 25.0000 mg | Freq: Four times a day (QID) | INTRAMUSCULAR | Status: DC | PRN

## 2022-08-21 MED ORDER — CEFAZOLIN SODIUM-DEXTROSE 2-4 GM/100ML-% IV SOLN
INTRAVENOUS | Status: AC
  Filled 2022-08-21: qty 100

## 2022-08-21 MED ORDER — MIDAZOLAM HCL 2 MG/2ML IJ SOLN: 1.0000 mg | Freq: Once | INTRAMUSCULAR | Status: AC

## 2022-08-21 MED ORDER — BISOPROLOL FUMARATE 5 MG PO TABS: 10.0000 mg | ORAL_TABLET | Freq: Every day | ORAL | Status: DC

## 2022-08-21 MED ORDER — CEFAZOLIN SODIUM-DEXTROSE 2-4 GM/100ML-% IV SOLN: 2.0000 g | Freq: Once | INTRAVENOUS | Status: DC

## 2022-08-21 MED ORDER — CEFAZOLIN IN SODIUM CHLORIDE 3-0.9 GM/100ML-% IV SOLN: 3.0000 g | INTRAVENOUS | Status: DC

## 2022-08-21 MED ORDER — MIDAZOLAM HCL 2 MG/2ML IJ SOLN
INTRAMUSCULAR | Status: AC
  Filled 2022-08-21: qty 2

## 2022-08-21 MED ORDER — POLYVINYL ALCOHOL 1.4 % OP SOLN
1.0000 [drp] | OPHTHALMIC | Status: DC | PRN
  Administered 2022-08-21: 1 [drp] via OPHTHALMIC
  Filled 2022-08-21: qty 15

## 2022-08-21 MED ORDER — LACTATED RINGERS IV SOLN: INTRAVENOUS | Status: DC

## 2022-08-21 MED ORDER — METHOCARBAMOL 500 MG PO TABS: 500.0000 mg | ORAL_TABLET | Freq: Four times a day (QID) | ORAL | Status: DC | PRN

## 2022-08-21 MED ORDER — ACETAMINOPHEN 325 MG PO TABS: 650.0000 mg | ORAL_TABLET | Freq: Four times a day (QID) | ORAL | Status: DC | PRN

## 2022-08-21 MED ORDER — OXYCODONE HCL 5 MG PO TABS: 5.0000 mg | ORAL_TABLET | Freq: Once | ORAL | Status: DC | PRN

## 2022-08-21 MED ORDER — ORAL CARE MOUTH RINSE: 15.0000 mL | Freq: Once | OROMUCOSAL | Status: AC

## 2022-08-21 MED ORDER — BUPIVACAINE-EPINEPHRINE (PF) 0.25% -1:200000 IJ SOLN
INTRAMUSCULAR | Status: AC
  Filled 2022-08-21: qty 30

## 2022-08-21 MED ORDER — BUPIVACAINE-EPINEPHRINE (PF) 0.5% -1:200000 IJ SOLN
INTRAMUSCULAR | Status: DC | PRN
  Administered 2022-08-21 (×2): 20 mL via PERINEURAL

## 2022-08-21 MED ORDER — ONDANSETRON HCL 4 MG/2ML IJ SOLN
INTRAMUSCULAR | Status: AC
  Filled 2022-08-21: qty 4

## 2022-08-21 MED ORDER — MIDAZOLAM HCL 2 MG/2ML IJ SOLN
INTRAMUSCULAR | Status: AC
  Administered 2022-08-21: 1 mg via INTRAVENOUS
  Filled 2022-08-21: qty 2

## 2022-08-21 MED ORDER — SODIUM CHLORIDE 0.9 % IV SOLN: INTRAVENOUS | Status: DC

## 2022-08-21 MED ORDER — OXYCODONE HCL 5 MG PO TABS
5.0000 mg | ORAL_TABLET | ORAL | Status: DC | PRN
  Administered 2022-08-22: 5 mg via ORAL
  Filled 2022-08-21: qty 1

## 2022-08-21 MED ORDER — FENTANYL CITRATE (PF) 100 MCG/2ML IJ SOLN: 25.0000 ug | INTRAMUSCULAR | Status: DC | PRN

## 2022-08-21 MED ORDER — FENTANYL CITRATE (PF) 250 MCG/5ML IJ SOLN
INTRAMUSCULAR | Status: AC
  Filled 2022-08-21: qty 5

## 2022-08-21 MED ORDER — BENAZEPRIL HCL 20 MG PO TABS
40.0000 mg | ORAL_TABLET | Freq: Every day | ORAL | Status: DC
  Administered 2022-08-21: 40 mg via ORAL
  Filled 2022-08-21: qty 2

## 2022-08-21 MED ORDER — LIDOCAINE 2% (20 MG/ML) 5 ML SYRINGE
INTRAMUSCULAR | Status: AC
  Filled 2022-08-21: qty 5

## 2022-08-21 MED ORDER — FENTANYL CITRATE (PF) 100 MCG/2ML IJ SOLN
INTRAMUSCULAR | Status: AC
  Administered 2022-08-21: 50 ug via INTRAVENOUS
  Filled 2022-08-21: qty 2

## 2022-08-21 MED ORDER — NIFEDIPINE ER OSMOTIC RELEASE 30 MG PO TB24
30.0000 mg | ORAL_TABLET | Freq: Every day | ORAL | Status: DC
  Administered 2022-08-22: 30 mg via ORAL
  Filled 2022-08-21: qty 1

## 2022-08-21 MED ORDER — SUGAMMADEX SODIUM 200 MG/2ML IV SOLN
INTRAVENOUS | Status: DC | PRN
  Administered 2022-08-21 (×2): 100 mg via INTRAVENOUS

## 2022-08-21 MED ORDER — ONDANSETRON HCL 4 MG/2ML IJ SOLN: 4.0000 mg | Freq: Four times a day (QID) | INTRAMUSCULAR | Status: DC | PRN

## 2022-08-21 MED ORDER — ONDANSETRON 4 MG PO TBDP: 4.0000 mg | ORAL_TABLET | Freq: Four times a day (QID) | ORAL | Status: DC | PRN

## 2022-08-21 MED ORDER — SIMETHICONE 80 MG PO CHEW: 40.0000 mg | CHEWABLE_TABLET | Freq: Four times a day (QID) | ORAL | Status: DC | PRN

## 2022-08-21 MED ORDER — BISOPROLOL-HYDROCHLOROTHIAZIDE 10-6.25 MG PO TABS: 1.0000 | ORAL_TABLET | Freq: Every morning | ORAL | Status: DC

## 2022-08-21 MED ORDER — OXYCODONE HCL 5 MG/5ML PO SOLN: 5.0000 mg | Freq: Once | ORAL | Status: DC | PRN

## 2022-08-21 MED ORDER — ZOLPIDEM TARTRATE 5 MG PO TABS: 5.0000 mg | ORAL_TABLET | Freq: Every evening | ORAL | Status: DC | PRN

## 2022-08-21 MED ORDER — TRAMADOL HCL 50 MG PO TABS
50.0000 mg | ORAL_TABLET | Freq: Four times a day (QID) | ORAL | Status: DC | PRN
  Administered 2022-08-22: 50 mg via ORAL
  Filled 2022-08-21: qty 1

## 2022-08-21 MED ORDER — ENOXAPARIN SODIUM 40 MG/0.4ML IJ SOSY
40.0000 mg | PREFILLED_SYRINGE | INTRAMUSCULAR | Status: DC
  Administered 2022-08-22: 40 mg via SUBCUTANEOUS
  Filled 2022-08-21: qty 0.4

## 2022-08-21 MED ORDER — DEXAMETHASONE SODIUM PHOSPHATE 10 MG/ML IJ SOLN
INTRAMUSCULAR | Status: DC | PRN
  Administered 2022-08-21: 10 mg via INTRAVENOUS

## 2022-08-21 MED ORDER — CEFAZOLIN IN SODIUM CHLORIDE 3-0.9 GM/100ML-% IV SOLN
INTRAVENOUS | Status: AC
  Filled 2022-08-21: qty 100

## 2022-08-21 MED ORDER — METOPROLOL TARTRATE 5 MG/5ML IV SOLN: 5.0000 mg | Freq: Four times a day (QID) | INTRAVENOUS | Status: DC | PRN

## 2022-08-21 MED ORDER — HYDROMORPHONE HCL 1 MG/ML IJ SOLN: 1.0000 mg | INTRAMUSCULAR | Status: DC | PRN

## 2022-08-21 MED ORDER — 0.9 % SODIUM CHLORIDE (POUR BTL) OPTIME
TOPICAL | Status: DC | PRN
  Administered 2022-08-21: 1000 mL

## 2022-08-21 MED ORDER — BUPIVACAINE-EPINEPHRINE 0.25% -1:200000 IJ SOLN
INTRAMUSCULAR | Status: DC | PRN
  Administered 2022-08-21: 20 mL

## 2022-08-21 MED ORDER — ROCURONIUM BROMIDE 10 MG/ML (PF) SYRINGE
PREFILLED_SYRINGE | INTRAVENOUS | Status: DC | PRN
  Administered 2022-08-21: 50 mg via INTRAVENOUS
  Administered 2022-08-21 (×2): 10 mg via INTRAVENOUS

## 2022-08-21 MED ORDER — FENTANYL CITRATE (PF) 250 MCG/5ML IJ SOLN
INTRAMUSCULAR | Status: DC | PRN
  Administered 2022-08-21 (×3): 50 ug via INTRAVENOUS
  Administered 2022-08-21 (×2): 25 ug via INTRAVENOUS

## 2022-08-21 MED ORDER — ATORVASTATIN CALCIUM 80 MG PO TABS
80.0000 mg | ORAL_TABLET | Freq: Every evening | ORAL | Status: DC
  Administered 2022-08-21: 80 mg via ORAL
  Filled 2022-08-21: qty 1

## 2022-08-21 MED ORDER — DIPHENHYDRAMINE HCL 25 MG PO CAPS: 25.0000 mg | ORAL_CAPSULE | Freq: Four times a day (QID) | ORAL | Status: DC | PRN

## 2022-08-21 MED ORDER — FENTANYL CITRATE (PF) 100 MCG/2ML IJ SOLN: 50.0000 ug | Freq: Once | INTRAMUSCULAR | Status: AC

## 2022-08-21 MED ORDER — LEVOTHYROXINE SODIUM 100 MCG PO TABS
100.0000 ug | ORAL_TABLET | Freq: Every day | ORAL | Status: DC
  Administered 2022-08-22: 100 ug via ORAL
  Filled 2022-08-21: qty 1

## 2022-08-21 MED ORDER — CHLORHEXIDINE GLUCONATE 0.12 % MT SOLN
OROMUCOSAL | Status: AC
  Administered 2022-08-21: 15 mL via OROMUCOSAL
  Filled 2022-08-21: qty 15

## 2022-08-21 MED ORDER — DEXAMETHASONE SODIUM PHOSPHATE 10 MG/ML IJ SOLN
INTRAMUSCULAR | Status: AC
  Filled 2022-08-21: qty 2

## 2022-08-21 MED ORDER — PHENYLEPHRINE 80 MCG/ML (10ML) SYRINGE FOR IV PUSH (FOR BLOOD PRESSURE SUPPORT)
PREFILLED_SYRINGE | INTRAVENOUS | Status: DC | PRN
  Administered 2022-08-21 (×4): 80 ug via INTRAVENOUS

## 2022-08-21 MED ORDER — ACETAMINOPHEN 650 MG RE SUPP: 650.0000 mg | Freq: Four times a day (QID) | RECTAL | Status: DC | PRN

## 2022-08-21 MED ORDER — ONDANSETRON HCL 4 MG/2ML IJ SOLN
INTRAMUSCULAR | Status: DC | PRN
  Administered 2022-08-21: 4 mg via INTRAVENOUS

## 2022-08-21 MED ORDER — LIDOCAINE 2% (20 MG/ML) 5 ML SYRINGE
INTRAMUSCULAR | Status: DC | PRN
  Administered 2022-08-21: 60 mg via INTRAVENOUS

## 2022-08-21 SURGICAL SUPPLY — 43 items
ADH SKN CLS APL DERMABOND .7 (GAUZE/BANDAGES/DRESSINGS) ×1
APL PRP STRL LF DISP 70% ISPRP (MISCELLANEOUS) ×1
BAG COUNTER SPONGE SURGICOUNT (BAG) ×2 IMPLANT
BAG SPNG CNTER NS LX DISP (BAG) ×1
BINDER ABDOMINAL 12 ML 46-62 (SOFTGOODS) IMPLANT
BIOPATCH RED 1 DISK 7.0 (GAUZE/BANDAGES/DRESSINGS) IMPLANT
CANISTER SUCT 3000ML PPV (MISCELLANEOUS) ×2 IMPLANT
CHLORAPREP W/TINT 26 (MISCELLANEOUS) ×2 IMPLANT
COVER SURGICAL LIGHT HANDLE (MISCELLANEOUS) ×2 IMPLANT
DERMABOND ADVANCED .7 DNX12 (GAUZE/BANDAGES/DRESSINGS) IMPLANT
DRAIN CHANNEL 19F RND (DRAIN) IMPLANT
DRAPE LAPAROSCOPIC ABDOMINAL (DRAPES) ×2 IMPLANT
DRSG TEGADERM 4X4.75 (GAUZE/BANDAGES/DRESSINGS) IMPLANT
ELECT CAUTERY BLADE 6.4 (BLADE) ×2 IMPLANT
ELECT REM PT RETURN 9FT ADLT (ELECTROSURGICAL) ×1
ELECTRODE REM PT RTRN 9FT ADLT (ELECTROSURGICAL) ×2 IMPLANT
EVACUATOR SILICONE 100CC (DRAIN) IMPLANT
GAUZE PAD ABD 8X10 STRL (GAUZE/BANDAGES/DRESSINGS) IMPLANT
GLOVE BIO SURGEON STRL SZ8 (GLOVE) ×2 IMPLANT
GLOVE BIOGEL PI IND STRL 8 (GLOVE) ×2 IMPLANT
GOWN STRL REUS W/ TWL LRG LVL3 (GOWN DISPOSABLE) ×2 IMPLANT
GOWN STRL REUS W/ TWL XL LVL3 (GOWN DISPOSABLE) ×2 IMPLANT
GOWN STRL REUS W/TWL LRG LVL3 (GOWN DISPOSABLE) ×1
GOWN STRL REUS W/TWL XL LVL3 (GOWN DISPOSABLE) ×1
KIT BASIN OR (CUSTOM PROCEDURE TRAY) ×2 IMPLANT
KIT TURNOVER KIT B (KITS) ×2 IMPLANT
MESH ULTRAPRO 6X6 15CM15CM (Mesh General) IMPLANT
NDL HYPO 25GX1X1/2 BEV (NEEDLE) IMPLANT
NEEDLE HYPO 25GX1X1/2 BEV (NEEDLE) ×1 IMPLANT
NS IRRIG 1000ML POUR BTL (IV SOLUTION) ×2 IMPLANT
PACK GENERAL/GYN (CUSTOM PROCEDURE TRAY) ×2 IMPLANT
PAD ARMBOARD 7.5X6 YLW CONV (MISCELLANEOUS) ×2 IMPLANT
STAPLER VISISTAT 35W (STAPLE) IMPLANT
SUT ETHILON 2 0 FS 18 (SUTURE) IMPLANT
SUT MON AB 4-0 PC3 18 (SUTURE) IMPLANT
SUT PDS AB 1 CTX 36 (SUTURE) ×4 IMPLANT
SUT VIC AB 0 CT1 27 (SUTURE) ×2
SUT VIC AB 0 CT1 27XCR 8 STRN (SUTURE) IMPLANT
SUT VIC AB 0 CT2 27 (SUTURE) IMPLANT
SUT VIC AB 3-0 SH 27 (SUTURE) ×2
SUT VIC AB 3-0 SH 27X BRD (SUTURE) IMPLANT
SYR CONTROL 10ML LL (SYRINGE) IMPLANT
TOWEL GREEN STERILE (TOWEL DISPOSABLE) ×2 IMPLANT

## 2022-08-21 NOTE — H&P (Signed)
History of Present Illness: Cassandra [name] is a 76 y.o. female who is seen today as an office consultation for evaluation of Hernia .   Patient presents for evaluation of incisional hernia. She has a history of a laparoscopic incisional hernia repair in 2017 with mesh. This was a intraperitoneal piece of mesh that was DualMesh. This was done [facility]. She has a history of a low anterior resection robotic assisted colovesical fistula prior to this. She has developed more bulging of her abdominal incision. There is some mild to moderate discomfort stretching  Review of Systems: A complete review of systems was obtained from the patient. I have reviewed this information and discussed as appropriate with the patient. See HPI as well for other ROS.    Medical History: Past Medical History:  Diagnosis Date  Hyperlipidemia  Hypertension  Pulmonary emboli (CMS-HCC)  Sleep apnea  Thyroid disease   There is no problem list on file for this patient.  Past Surgical History:  Procedure Laterality Date  knee replacmeent  LAPAROSCOPIC INCISIONAL / UMBILICAL / VENTRAL HERNIA REPAIR  MASTECTOMY PARTIAL / LUMPECTOMY    No Known Allergies  Current Outpatient Medications on File Prior to Visit  Medication Sig Dispense Refill  benazepriL (LOTENSIN) 40 MG tablet  bisoproloL-hydroCHLOROthiazide (ZIAC) 10-6.25 mg tablet Take 1 tablet by mouth once daily  ELIQUIS 5 mg tablet 1 tablet  levothyroxine (SYNTHROID) 100 MCG tablet 1 tablet every morning on an empty stomach  LIPITOR 80 mg tablet 1 tablet  NIFEdipine (ADALAT CC) 30 MG ER tablet take 1 tablet one time daily on an empty stomach   No current facility-administered medications on file prior to visit.   Family History  Problem Relation Age of Onset  High blood pressure (Hypertension) Mother  COPD Father  Stroke Brother  Myocardial Infarction (Heart attack) Brother    Social History   Tobacco Use  Smoking Status Never  Smokeless Tobacco  Never    Social History   Socioeconomic History  Marital status: Single  Tobacco Use  Smoking status: Never  Smokeless tobacco: Never   Objective:   Vitals:  04/23/22 1414  BP: 132/84  Pulse: 77  Weight: 93.6 kg (206 lb 6.4 oz)  Height: 152.4 cm (5')   Body mass index is 40.31 kg/m.  Physical Exam HENT:  Head: Normocephalic.  Eyes:  Pupils: Pupils are equal, round, and reactive to light.  Cardiovascular:  Rate and Rhythm: Normal rate.  Pulmonary:  Effort: Pulmonary effort is normal.  Breath sounds: No stridor.  Abdominal:   Comments: Difficult to discern for hernia due to obesity and diastases of her abdominal wall. 10 cm bulge periumbilical region  Musculoskeletal:  Cervical back: Normal range of motion.  Right lower leg: Edema present.  Left lower leg: Edema present.  Neurological:  Mental Status: She is alert.    Assessment and Plan:   Diagnoses and all orders for this visit:  Incisional hernia, without obstruction or gangrene Comments: 10 cm Orders: - CT abdomen pelvis with contrast; Future    Obtain CT scan to further evaluate anatomy  Discussed open repair and potential recovery versus laparoscopic repair the use of mesh.The risk of hernia repair include bleeding, Infection, Recurrence of the hernia, Mesh use, chronic pain, Organ injury, Bowel injury, Bladder injury, nerve injury with numbness around the incision, Death, and worsening of preexisting medical problems. The alternatives to surgery have been discussed as well.. Long term expectations of both operative and non operative treatments have been discussed. The patient  agrees to proceed.   No follow-ups on file.  Kennieth Francois, MD

## 2022-08-21 NOTE — Anesthesia Procedure Notes (Signed)
Anesthesia Regional Block: TAP block   Pre-Anesthetic Checklist: , timeout performed,  Correct Patient, Correct Site, Correct Laterality,  Correct Procedure, Correct Position, site marked,  Risks and benefits discussed,  Surgical consent,  Pre-op evaluation,  At surgeon's request and post-op pain management  Laterality: Left  Prep: chloraprep       Needles:  Injection technique: Single-shot  Needle Type: Echogenic Needle     Needle Length: 9cm  Needle Gauge: 21     Additional Needles:   Narrative:  Start time: 08/21/2022 11:30 AM End time: 08/21/2022 11:36 AM Injection made incrementally with aspirations every 5 mL.  Performed by: Personally  Anesthesiologist: Albertha Ghee, MD  Additional Notes: Pt tolerated the procedure well.

## 2022-08-21 NOTE — Anesthesia Procedure Notes (Signed)
Procedure Name: Intubation Date/Time: 08/21/2022 12:15 PM  Performed by: Leonor Liv, CRNAPre-anesthesia Checklist: Patient identified, Emergency Drugs available, Suction available and Patient being monitored Patient Re-evaluated:Patient Re-evaluated prior to induction Oxygen Delivery Method: Circle System Utilized Preoxygenation: Pre-oxygenation with 100% oxygen Induction Type: IV induction Ventilation: Mask ventilation without difficulty Laryngoscope Size: Mac and 3 Grade View: Grade I Tube type: Oral Tube size: 7.0 mm Number of attempts: 1 Airway Equipment and Method: Stylet and Oral airway Placement Confirmation: ETT inserted through vocal cords under direct vision, positive ETCO2 and breath sounds checked- equal and bilateral Secured at: 21 cm Tube secured with: Tape Dental Injury: Teeth and Oropharynx as per pre-operative assessment

## 2022-08-21 NOTE — Anesthesia Procedure Notes (Signed)
Anesthesia Regional Block: TAP block   Pre-Anesthetic Checklist: , timeout performed,  Correct Patient, Correct Site, Correct Laterality,  Correct Procedure, Correct Position, site marked,  Risks and benefits discussed,  Surgical consent,  Pre-op evaluation,  At surgeon's request and post-op pain management  Laterality: Right  Prep: chloraprep       Needles:  Injection technique: Single-shot  Needle Type: Echogenic Needle     Needle Length: 9cm  Needle Gauge: 21     Additional Needles:   Narrative:  Start time: 08/21/2022 11:37 AM End time: 08/21/2022 11:44 AM Injection made incrementally with aspirations every 5 mL.  Performed by: Personally  Anesthesiologist: Albertha Ghee, MD  Additional Notes: Pt tolerated the procedure well.

## 2022-08-21 NOTE — Transfer of Care (Signed)
Immediate Anesthesia Transfer of Care Note  Patient: Cassandra [name]  Procedure(s) Performed: REPAIR OF INCISIONAL HERNIA (Abdomen) INSERTION OF MESH (Abdomen)  Patient Location: PACU  Anesthesia Type:General and Regional  Level of Consciousness: drowsy and patient cooperative  Airway & Oxygen Therapy: Patient Spontanous Breathing and Patient connected to face mask oxygen  Post-op Assessment: Report given to RN and Post -op Vital signs reviewed and stable  Post vital signs: Reviewed and stable  Last Vitals:  Vitals Value Taken Time  BP 132/72 08/21/22 1504  Temp    Pulse 71 08/21/22 1506  Resp 22 08/21/22 1506  SpO2 93 % 08/21/22 1506  Vitals shown include unvalidated device data.  Last Pain:  Vitals:   08/21/22 1135  TempSrc:   PainSc: 0-No pain         Complications: No notable events documented.

## 2022-08-21 NOTE — Op Note (Signed)
Preoperative diagnosis: Recurrent incisional hernia measuring 5 cm x 7 cm reducible without obstruction or gangrene  Postoperative diagnosis: Same  Procedure: Repair of recurrent incisional hernia with mesh (submuscular)  Surgeon: Erroll Luna, MD  Assistant: Dr. Conley Canal MD  I was personally performed  the key and critical portions of this procedure and immediately available throughout the entire procedure, as documented in my operative note.   Anesthesia: General with TAP block and 0.25% Sensorcaine with epinephrine local  EBL: 40 cc  Specimen: None  Drains: 19 round  IV fluids: Per anesthesia record  Indications for procedure: The patient is a 58 female with history of multiple abdominal hernia repairs in the past.  She also had a partial colectomy for diverticulitis years ago as well.  She presents with recurrent incisional hernia.  Discussed options of repair as well as laparoscopic, open, and modifications of the above repairs.  She agreed to proceed with open repair of a recurrent incisional hernia after reviewing all of her options.The risk of hernia repair include bleeding,  Infection,   Recurrence of the hernia,  Mesh use, chronic pain,  Organ injury,  Bowel injury,  Bladder injury,   nerve injury with numbness around the incision,  Death,  and worsening of preexisting  medical problems.  The alternatives to surgery have been discussed as well..  Long term expectations of both operative and non operative treatments have been discussed.   The patient agrees to proceed.     Description of procedure: The patient was met in the holding area.  Of note TAP blocks were placed anesthesia.  All questions were answered.  She was taken back to the op room.  She is placed supine upon the OR table.  After induction of general anesthesia, the abdomen was prepped and draped in sterile fashion and timeout performed.  Proper patient, site and procedure verified.  The hernia is located  just below the umbilicus to a previous incision.  Curvilinear incision was made around the umbilicus.  This was taken down to the fascia just below the umbilicus.  There is a 7 cm defect noted.  I opened this and there is omentum within the hernia defect itself.  The fascial edges were 7 cm apart but there is also a piece of material from previous repair it look like.  This had pulled through and was present within the hernia sac itself.  Omental adhesions were taken down with cautery and sharp dissection.  Once I did this I debrided back the old mesh back to the fascial edges.  Once this was done, I was able then to identify both fascial edges as well as the cranial and caudal extent of the hernia.  This measured a good 7 cm and the width was about 5 cm.  Once identified the rectus muscle I opened the fascia and created a plane in the retrorectus space.  This was done on both sides.  Of note she had a previous right median incision from previous surgery.  There is also mesh over here and I cut this back.  Most of mesh was well integrated without signs of infection.  Once both retromuscular space was developed, I was able to closed the posterior sheath with running 0 Vicryl.  There is no undue tension and no tearing.  Once I did this a 6 inch x 6 inch piece of ultra Pro mesh was placed in the retromuscular position.  I laid this out and trimmed to fit the  defect.  There was adequate overlap of the repair.  Hemostasis was excellent.  This was irrigated.  I then placed a 19 round drain in the space.  We then closed the external fascia to the rectus muscles and approximated that back to the midline with a running #1 PDS.  Care was taken not to incorporate the drain in this closure.  The subcutaneous space was then closed with a deep layer Vicryl.  This was 3-0 Vicryl.  Skin closed with 4 Monocryl.  Dermabond applied.  All counts found to be correct.  After binder was placed the patient was extubated taken recovery in  satisfactory condition.

## 2022-08-21 NOTE — Anesthesia Preprocedure Evaluation (Signed)
Anesthesia Evaluation  Patient identified by MRN, date of birth, ID band Patient awake    Reviewed: Allergy & Precautions, H&P , NPO status , Patient's Chart, lab work & pertinent test results  Airway Mallampati: II   Neck ROM: full    Dental   Pulmonary shortness of breath, sleep apnea    breath sounds clear to auscultation       Cardiovascular hypertension, + DVT   Rhythm:regular Rate:Normal     Neuro/Psych    GI/Hepatic   Endo/Other  Hypothyroidism    Renal/GU      Musculoskeletal  (+) Arthritis ,    Abdominal   Peds  Hematology   Anesthesia Other Findings   Reproductive/Obstetrics                             Anesthesia Physical Anesthesia Plan  ASA: 2  Anesthesia Plan: General   Post-op Pain Management: Regional block*   Induction: Intravenous  PONV Risk Score and Plan: 3 and Ondansetron, Dexamethasone, Midazolam and Treatment may vary due to age or medical condition  Airway Management Planned: Oral ETT  Additional Equipment:   Intra-op Plan:   Post-operative Plan: Extubation in OR  Informed Consent: I have reviewed the patients History and Physical, chart, labs and discussed the procedure including the risks, benefits and alternatives for the proposed anesthesia with the patient or authorized representative who has indicated his/her understanding and acceptance.     Dental advisory given  Plan Discussed with: CRNA, Anesthesiologist and Surgeon  Anesthesia Plan Comments:        Anesthesia Quick Evaluation

## 2022-08-21 NOTE — Interval H&P Note (Signed)
History and Physical Interval Note:  08/21/2022 12:00 PM  Cassandra [name]  has presented today for surgery, with the diagnosis of INCISIONAL HERNIA.  The various methods of treatment have been discussed with the patient and family. After consideration of risks, benefits and other options for treatment, the patient has consented to  Procedure(s) with comments: [place] (N/A) - GEN/TAP BLOCK as a surgical intervention.  The patient's history has been reviewed, patient examined, no change in status, stable for surgery.  I have reviewed the patient's chart and labs.  Questions were answered to the patient's satisfaction.   The risk of hernia repair include bleeding,  Infection,   Recurrence of the hernia,  Mesh use, chronic pain,  Organ injury,  Bowel injury,  Bladder injury,   nerve injury with numbness around the incision,  Death,  and worsening of preexisting  medical problems.  The alternatives to surgery have been discussed as well..  Long term expectations of both operative and non operative treatments have been discussed.   The patient agrees to proceed.   Turner Daniels MD

## 2022-08-22 ENCOUNTER — Encounter (HOSPITAL_COMMUNITY): Payer: Self-pay | Admitting: Surgery

## 2022-08-22 DIAGNOSIS — K430 Incisional hernia with obstruction, without gangrene: Secondary | ICD-10-CM | POA: Diagnosis not present

## 2022-08-22 DIAGNOSIS — I10 Essential (primary) hypertension: Secondary | ICD-10-CM | POA: Diagnosis not present

## 2022-08-22 DIAGNOSIS — Z96659 Presence of unspecified artificial knee joint: Secondary | ICD-10-CM | POA: Diagnosis not present

## 2022-08-22 DIAGNOSIS — Z7901 Long term (current) use of anticoagulants: Secondary | ICD-10-CM | POA: Diagnosis not present

## 2022-08-22 DIAGNOSIS — Z79899 Other long term (current) drug therapy: Secondary | ICD-10-CM | POA: Diagnosis not present

## 2022-08-22 MED ORDER — METHOCARBAMOL 750 MG PO TABS: 750.0000 mg | ORAL_TABLET | Freq: Three times a day (TID) | ORAL | 1 refills | Status: AC | PRN

## 2022-08-22 MED ORDER — OXYCODONE HCL 5 MG PO TABS: 5.0000 mg | ORAL_TABLET | Freq: Four times a day (QID) | ORAL | 0 refills | Status: AC | PRN

## 2022-08-22 NOTE — Discharge Summary (Signed)
Physician Discharge Summary  Patient ID: Cassandra [name] MRN: [id number] DOB/AGE: Sep 05, 1946 76 y.o.  Admit date: 08/21/2022 Discharge date: 08/22/2022  Admission [place]   Discharge Diagnoses:  Principal Problem:   Incisional hernia Active Problems:   Recurrent incisional hernia with obstruction   Discharged Condition: good  Hospital Course: Pt sis well post op. She had good pain control, tolerated po intake and ambulated without difficulty.      Treatments: surgery: incisional hernia repair  with mesh   Discharge Exam: Blood pressure (!) 149/64, pulse 72, temperature 100.2 F (37.9 C), temperature source Oral, resp. rate 16, height 5' (1.524 m), weight 90.7 kg, SpO2 94 %. General appearance: alert and cooperative Resp: clear to auscultation bilaterally Cardio: NSR  Incision/Wound:CDI MILD BRUISING NOTED JP SEROUS   Disposition: Discharge disposition: 01-Home or Self Care       Discharge Instructions     Diet - low sodium heart healthy   Complete by: As directed    Increase activity slowly   Complete by: As directed       Allergies as of 08/22/2022   No Known Allergies      Medication List     STOP taking these medications    traMADol 50 MG tablet Commonly known as: ULTRAM       TAKE these medications    acetaminophen 500 MG tablet Commonly known as: TYLENOL Take 1,000 mg by mouth every 6 (six) hours as needed for moderate pain.   apixaban 5 MG Tabs tablet Commonly known as: ELIQUIS Take 5 mg by mouth 2 (two) times daily.   atorvastatin 80 MG tablet Commonly known as: LIPITOR Take 80 mg by mouth every evening.   b complex vitamins capsule Take 1 capsule by mouth in the morning.   benazepril 40 MG tablet Commonly known as: LOTENSIN Take 40 mg by mouth at bedtime.   bisoprolol-hydrochlorothiazide 10-6.25 MG tablet Commonly known as: ZIAC Take 1 tablet by mouth every morning.   CALCIUM PO Take 600 mg by  mouth 2 (two) times daily.   cholecalciferol 25 MCG (1000 UNIT) tablet Commonly known as: VITAMIN D3 Take 1,000 Units by mouth in the morning and at bedtime.   Co Q 10 100 MG Caps Take 100 mg by mouth every morning.   Fish Oil 1000 MG Caps Take 1,000 mg by mouth every morning.   gabapentin 300 MG capsule Commonly known as: NEURONTIN Take a 300 mg capsule three times a day for two weeks following surgery.Then take a 300 mg capsule two times a day for two weeks. Then take a 300 mg capsule once a day for two weeks. Then discontinue.   levothyroxine 100 MCG tablet Commonly known as: SYNTHROID Take 100 mcg by mouth daily before breakfast.   loratadine 10 MG tablet Commonly known as: CLARITIN Take 10 mg by mouth every morning.   Lysine 500 MG Caps Take 500 mg by mouth every morning.   methocarbamol 750 MG tablet Commonly known as: Robaxin-750 Take 1 tablet (750 mg total) by mouth every 8 (eight) hours as needed for muscle spasms. What changed:  medication strength how much to take when to take this   multivitamin with minerals Tabs tablet Take 1 tablet by mouth every morning.   NIFEdipine 30 MG 24 hr tablet Commonly known as: ADALAT CC Take 30 mg by mouth daily before breakfast.   NON FORMULARY Pt uses a cpap nightly   oxyCODONE 5 MG immediate release tablet Commonly known as: Oxy  IR/ROXICODONE Take 1 tablet (5 mg total) by mouth every 6 (six) hours as needed for severe pain. What changed: how much to take   TURMERIC PO Take 1 capsule by mouth every evening.   Vitamin A 2400 MCG (8000 UT) Caps Take 8,000 Units by mouth in the morning.   vitamin E 180 MG (400 UNITS) capsule Take 400 Units by mouth in the morning.         Signed: Joyice Faster Dannie Woolen 08/22/2022, 8:04 AM

## 2022-08-22 NOTE — Plan of Care (Signed)
Patient ID: Cassandra [name], female   DOB: Aug 17, 1946, 76 y.o.   MRN: [id number]  Problem: Education: Goal: Knowledge of General Education information will improve Description: Including pain rating scale, medication(s)/side effects and non-pharmacologic comfort measures Outcome: Adequate for Discharge   Problem: Health Behavior/Discharge Planning: Goal: Ability to manage health-related needs will improve Outcome: Adequate for Discharge   Problem: Clinical Measurements: Goal: Ability to maintain clinical measurements within normal limits will improve Outcome: Adequate for Discharge Goal: Will remain free from infection Outcome: Adequate for Discharge Goal: Diagnostic test results will improve Outcome: Adequate for Discharge Goal: Respiratory complications will improve Outcome: Adequate for Discharge Goal: Cardiovascular complication will be avoided Outcome: Adequate for Discharge   Problem: Activity: Goal: Risk for activity intolerance will decrease Outcome: Adequate for Discharge   Problem: Nutrition: Goal: Adequate nutrition will be maintained Outcome: Adequate for Discharge   Problem: Coping: Goal: Level of anxiety will decrease Outcome: Adequate for Discharge   Problem: Elimination: Goal: Will not experience complications related to bowel motility Outcome: Adequate for Discharge Goal: Will not experience complications related to urinary retention Outcome: Adequate for Discharge   Problem: Pain Managment: Goal: General experience of comfort will improve Outcome: Adequate for Discharge   Problem: Safety: Goal: Ability to remain free from injury will improve Outcome: Adequate for Discharge   Problem: Skin Integrity: Goal: Risk for impaired skin integrity will decrease Outcome: Adequate for Discharge   Haydee Salter, RN

## 2022-08-22 NOTE — Discharge Instructions (Addendum)
[facility] _______[facility]  UMBILICAL OR INGUINAL HERNIA REPAIR: POST OP INSTRUCTIONS  Always review your discharge instruction sheet given to you by the facility where your surgery was performed. IF YOU HAVE DISABILITY OR FAMILY LEAVE FORMS, YOU MUST BRING THEM TO THE OFFICE FOR PROCESSING.   DO NOT GIVE THEM TO YOUR DOCTOR.  1. A  prescription for pain medication may be given to you upon discharge.  Take your pain medication as prescribed, if needed.  If narcotic pain medicine is not needed, then you may take acetaminophen (Tylenol) or ibuprofen (Advil) as needed. 2. Take your usually prescribed medications unless otherwise directed. If you need a refill on your pain medication, please contact your pharmacy.  They will contact our office to request authorization. Prescriptions will not be filled after 5 pm or on week-ends. 3. You should follow a light diet the first 24 hours after arrival home, such as soup and crackers, etc.  Be sure to include lots of fluids daily.  Resume your normal diet the day after surgery. 4.Most patients will experience some swelling and bruising around the umbilicus or in the groin and scrotum.  Ice packs and reclining will help.  Swelling and bruising can take several days to resolve.  6. It is common to experience some constipation if taking pain medication after surgery.  Increasing fluid intake and taking a stool softener (such as Colace) will usually help or prevent this problem from occurring.  A mild laxative (Milk of Magnesia or Miralax) should be taken according to package directions if there are no bowel movements after 48 hours. 7. Unless discharge instructions indicate otherwise, you may remove your bandages 24-48 hours after surgery, and you may shower at that time.  You may have steri-strips (small skin tapes) in place directly over the incision.  These strips should be left on the skin for 7-10 days.  If your surgeon used skin glue on the  incision, you may shower in 24 hours.  The glue will flake off over the next 2-3 weeks.  Any sutures or staples will be removed at the office during your follow-up visit. 8. ACTIVITIES:  You may resume regular (light) daily activities beginning the next day--such as daily self-care, walking, climbing stairs--gradually increasing activities as tolerated.  You may have sexual intercourse when it is comfortable.  Refrain from any heavy lifting or straining until approved by your doctor.  a.You may drive when you are no longer taking prescription pain medication, you can comfortably wear a seatbelt, and you can safely maneuver your car and apply brakes. b.RETURN TO WORK:   _____________________________________________  9.You should see your doctor in the office for a follow-up appointment approximately 2-3 weeks after your surgery.  Make sure that you call for this appointment within a day or two after you arrive home to insure a convenient appointment time. 10.OTHER INSTRUCTIONS: _________________________    _____________________________________  WHEN TO CALL YOUR DOCTOR: Fever over 101.0 Inability to urinate Nausea and/or vomiting Extreme swelling or bruising Continued bleeding from incision. Increased pain, redness, or drainage from the incision  The clinic staff is available to answer your questions during regular business hours.  Please don't hesitate to call and ask to speak to one of the nurses for clinical concerns.  If you have a medical emergency, go to the nearest emergency room or call 911.  A surgeon from [facility] is always on call at the hospital   [address], [address],   [place], [place]  [address] ?  [address], [place], [place]   [address] [phone] ? [phone] ? FAX [phone] Web site: [email or web address]      RESTART BLOOD THINNERS ON THURSDAY    MIRALAX FOR CONSTIPATION AS NEEDED

## 2022-08-22 NOTE — Progress Notes (Signed)
Mobility Specialist - Progress Note   08/22/22 0900  Mobility  Activity Ambulated with assistance in hallway  Level of Assistance Standby assist, set-up cues, supervision of patient - no hands on  Assistive Device None  Distance Ambulated (ft) 300 ft  Activity Response Tolerated well  $Mobility charge 1 Mobility    Pt received in bed agreeable to mobility. Left in room w/ call bell in reach and all needs met.   [place] Specialist Please contact via SecureChat or Rehab office at [phone]

## 2022-08-22 NOTE — Anesthesia Postprocedure Evaluation (Signed)
Anesthesia Post Note  Patient: Cassandra [name]  Procedure(s) Performed: REPAIR OF INCISIONAL HERNIA (Abdomen) INSERTION OF MESH (Abdomen)     Patient location during evaluation: PACU Anesthesia Type: General and Regional Level of consciousness: awake and alert Pain management: pain level controlled Vital Signs Assessment: post-procedure vital signs reviewed and stable Respiratory status: spontaneous breathing, nonlabored ventilation, respiratory function stable and patient connected to nasal cannula oxygen Cardiovascular status: blood pressure returned to baseline and stable Postop Assessment: no apparent nausea or vomiting Anesthetic complications: no   No notable events documented.  Last Vitals:  Vitals:   08/22/22 0600 08/22/22 0751  BP: (!) 145/80 (!) 149/64  Pulse: 79 72  Resp: 18 16  Temp: 37 C 37.9 C  SpO2: 95% 94%    Last Pain:  Vitals:   08/22/22 0751  TempSrc: Oral  PainSc:                  [place]

## 2022-08-23 DIAGNOSIS — E669 Obesity, unspecified: Secondary | ICD-10-CM | POA: Diagnosis not present

## 2022-09-03 DIAGNOSIS — Z7901 Long term (current) use of anticoagulants: Secondary | ICD-10-CM | POA: Diagnosis not present

## 2022-09-03 DIAGNOSIS — I10 Essential (primary) hypertension: Secondary | ICD-10-CM | POA: Diagnosis not present

## 2022-09-03 DIAGNOSIS — Z86711 Personal history of pulmonary embolism: Secondary | ICD-10-CM | POA: Diagnosis not present

## 2022-09-03 DIAGNOSIS — E039 Hypothyroidism, unspecified: Secondary | ICD-10-CM | POA: Diagnosis not present

## 2022-09-03 DIAGNOSIS — R7303 Prediabetes: Secondary | ICD-10-CM | POA: Diagnosis not present

## 2022-09-03 DIAGNOSIS — M810 Age-related osteoporosis without current pathological fracture: Secondary | ICD-10-CM | POA: Diagnosis not present

## 2022-09-03 DIAGNOSIS — E785 Hyperlipidemia, unspecified: Secondary | ICD-10-CM | POA: Diagnosis not present

## 2022-09-10 DIAGNOSIS — K432 Incisional hernia without obstruction or gangrene: Secondary | ICD-10-CM | POA: Diagnosis not present

## 2022-09-10 DIAGNOSIS — Z9889 Other specified postprocedural states: Secondary | ICD-10-CM | POA: Diagnosis not present

## 2022-10-22 DIAGNOSIS — E669 Obesity, unspecified: Secondary | ICD-10-CM | POA: Diagnosis not present

## 2022-10-22 DIAGNOSIS — Z6839 Body mass index (BMI) 39.0-39.9, adult: Secondary | ICD-10-CM | POA: Diagnosis not present

## 2022-11-21 DIAGNOSIS — E669 Obesity, unspecified: Secondary | ICD-10-CM | POA: Diagnosis not present

## 2022-11-21 DIAGNOSIS — Z6837 Body mass index (BMI) 37.0-37.9, adult: Secondary | ICD-10-CM | POA: Diagnosis not present

## 2022-12-23 DIAGNOSIS — Z6837 Body mass index (BMI) 37.0-37.9, adult: Secondary | ICD-10-CM | POA: Diagnosis not present

## 2022-12-23 DIAGNOSIS — E669 Obesity, unspecified: Secondary | ICD-10-CM | POA: Diagnosis not present

## 2023-01-22 DIAGNOSIS — Z6838 Body mass index (BMI) 38.0-38.9, adult: Secondary | ICD-10-CM | POA: Diagnosis not present

## 2023-01-22 DIAGNOSIS — E669 Obesity, unspecified: Secondary | ICD-10-CM | POA: Diagnosis not present

## 2023-01-29 DIAGNOSIS — H02831 Dermatochalasis of right upper eyelid: Secondary | ICD-10-CM | POA: Diagnosis not present

## 2023-01-29 DIAGNOSIS — H35033 Hypertensive retinopathy, bilateral: Secondary | ICD-10-CM | POA: Diagnosis not present

## 2023-01-29 DIAGNOSIS — H25813 Combined forms of age-related cataract, bilateral: Secondary | ICD-10-CM | POA: Diagnosis not present

## 2023-01-29 DIAGNOSIS — H02834 Dermatochalasis of left upper eyelid: Secondary | ICD-10-CM | POA: Diagnosis not present

## 2023-02-22 DIAGNOSIS — E669 Obesity, unspecified: Secondary | ICD-10-CM | POA: Diagnosis not present

## 2023-02-22 DIAGNOSIS — Z6837 Body mass index (BMI) 37.0-37.9, adult: Secondary | ICD-10-CM | POA: Diagnosis not present

## 2023-03-24 DIAGNOSIS — E669 Obesity, unspecified: Secondary | ICD-10-CM | POA: Diagnosis not present

## 2023-03-24 DIAGNOSIS — Z6837 Body mass index (BMI) 37.0-37.9, adult: Secondary | ICD-10-CM | POA: Diagnosis not present

## 2023-03-25 DIAGNOSIS — I10 Essential (primary) hypertension: Secondary | ICD-10-CM | POA: Diagnosis not present

## 2023-03-25 DIAGNOSIS — I890 Lymphedema, not elsewhere classified: Secondary | ICD-10-CM | POA: Diagnosis not present

## 2023-03-25 DIAGNOSIS — G4733 Obstructive sleep apnea (adult) (pediatric): Secondary | ICD-10-CM | POA: Diagnosis not present

## 2023-03-25 DIAGNOSIS — Z0000 Encounter for general adult medical examination without abnormal findings: Secondary | ICD-10-CM | POA: Diagnosis not present

## 2023-03-25 DIAGNOSIS — M810 Age-related osteoporosis without current pathological fracture: Secondary | ICD-10-CM | POA: Diagnosis not present

## 2023-03-25 DIAGNOSIS — E039 Hypothyroidism, unspecified: Secondary | ICD-10-CM | POA: Diagnosis not present

## 2023-03-25 DIAGNOSIS — G4734 Idiopathic sleep related nonobstructive alveolar hypoventilation: Secondary | ICD-10-CM | POA: Diagnosis not present

## 2023-03-25 DIAGNOSIS — E785 Hyperlipidemia, unspecified: Secondary | ICD-10-CM | POA: Diagnosis not present

## 2023-03-25 DIAGNOSIS — R7303 Prediabetes: Secondary | ICD-10-CM | POA: Diagnosis not present

## 2023-04-24 DIAGNOSIS — Z6838 Body mass index (BMI) 38.0-38.9, adult: Secondary | ICD-10-CM | POA: Diagnosis not present

## 2023-04-24 DIAGNOSIS — E669 Obesity, unspecified: Secondary | ICD-10-CM | POA: Diagnosis not present

## 2023-05-25 DIAGNOSIS — E669 Obesity, unspecified: Secondary | ICD-10-CM | POA: Diagnosis not present

## 2023-05-25 DIAGNOSIS — Z6837 Body mass index (BMI) 37.0-37.9, adult: Secondary | ICD-10-CM | POA: Diagnosis not present

## 2023-06-19 DIAGNOSIS — Z1231 Encounter for screening mammogram for malignant neoplasm of breast: Secondary | ICD-10-CM | POA: Diagnosis not present

## 2023-06-24 DIAGNOSIS — E669 Obesity, unspecified: Secondary | ICD-10-CM | POA: Diagnosis not present

## 2023-06-24 DIAGNOSIS — Z6837 Body mass index (BMI) 37.0-37.9, adult: Secondary | ICD-10-CM | POA: Diagnosis not present

## 2023-07-25 DIAGNOSIS — E669 Obesity, unspecified: Secondary | ICD-10-CM | POA: Diagnosis not present

## 2023-07-25 DIAGNOSIS — Z6837 Body mass index (BMI) 37.0-37.9, adult: Secondary | ICD-10-CM | POA: Diagnosis not present

## 2023-08-24 DIAGNOSIS — E669 Obesity, unspecified: Secondary | ICD-10-CM | POA: Diagnosis not present

## 2023-08-24 DIAGNOSIS — Z6837 Body mass index (BMI) 37.0-37.9, adult: Secondary | ICD-10-CM | POA: Diagnosis not present

## 2023-09-23 DIAGNOSIS — E669 Obesity, unspecified: Secondary | ICD-10-CM | POA: Diagnosis not present

## 2023-09-23 DIAGNOSIS — Z6837 Body mass index (BMI) 37.0-37.9, adult: Secondary | ICD-10-CM | POA: Diagnosis not present

## 2023-10-24 DIAGNOSIS — E669 Obesity, unspecified: Secondary | ICD-10-CM | POA: Diagnosis not present

## 2023-10-24 DIAGNOSIS — Z6837 Body mass index (BMI) 37.0-37.9, adult: Secondary | ICD-10-CM | POA: Diagnosis not present

## 2023-11-22 DIAGNOSIS — E669 Obesity, unspecified: Secondary | ICD-10-CM | POA: Diagnosis not present

## 2023-11-22 DIAGNOSIS — Z6837 Body mass index (BMI) 37.0-37.9, adult: Secondary | ICD-10-CM | POA: Diagnosis not present

## 2023-12-23 DIAGNOSIS — Z6836 Body mass index (BMI) 36.0-36.9, adult: Secondary | ICD-10-CM | POA: Diagnosis not present

## 2023-12-23 DIAGNOSIS — E669 Obesity, unspecified: Secondary | ICD-10-CM | POA: Diagnosis not present

## 2024-01-15 DIAGNOSIS — E785 Hyperlipidemia, unspecified: Secondary | ICD-10-CM | POA: Diagnosis not present

## 2024-01-15 DIAGNOSIS — I10 Essential (primary) hypertension: Secondary | ICD-10-CM | POA: Diagnosis not present

## 2024-01-15 DIAGNOSIS — M810 Age-related osteoporosis without current pathological fracture: Secondary | ICD-10-CM | POA: Diagnosis not present

## 2024-01-15 DIAGNOSIS — R7303 Prediabetes: Secondary | ICD-10-CM | POA: Diagnosis not present

## 2024-01-15 DIAGNOSIS — Z7901 Long term (current) use of anticoagulants: Secondary | ICD-10-CM | POA: Diagnosis not present

## 2024-01-15 DIAGNOSIS — G4733 Obstructive sleep apnea (adult) (pediatric): Secondary | ICD-10-CM | POA: Diagnosis not present

## 2024-01-15 DIAGNOSIS — Z86711 Personal history of pulmonary embolism: Secondary | ICD-10-CM | POA: Diagnosis not present

## 2024-01-15 DIAGNOSIS — E039 Hypothyroidism, unspecified: Secondary | ICD-10-CM | POA: Diagnosis not present

## 2024-01-22 DIAGNOSIS — E669 Obesity, unspecified: Secondary | ICD-10-CM | POA: Diagnosis not present

## 2024-01-22 DIAGNOSIS — Z6837 Body mass index (BMI) 37.0-37.9, adult: Secondary | ICD-10-CM | POA: Diagnosis not present

## 2024-02-06 DIAGNOSIS — H02831 Dermatochalasis of right upper eyelid: Secondary | ICD-10-CM | POA: Diagnosis not present

## 2024-02-06 DIAGNOSIS — H04123 Dry eye syndrome of bilateral lacrimal glands: Secondary | ICD-10-CM | POA: Diagnosis not present

## 2024-02-06 DIAGNOSIS — H02834 Dermatochalasis of left upper eyelid: Secondary | ICD-10-CM | POA: Diagnosis not present

## 2024-02-06 DIAGNOSIS — H25813 Combined forms of age-related cataract, bilateral: Secondary | ICD-10-CM | POA: Diagnosis not present

## 2024-02-06 DIAGNOSIS — H35033 Hypertensive retinopathy, bilateral: Secondary | ICD-10-CM | POA: Diagnosis not present

## 2024-02-22 DIAGNOSIS — E785 Hyperlipidemia, unspecified: Secondary | ICD-10-CM | POA: Diagnosis not present

## 2024-02-22 DIAGNOSIS — I10 Essential (primary) hypertension: Secondary | ICD-10-CM | POA: Diagnosis not present

## 2024-02-22 DIAGNOSIS — M810 Age-related osteoporosis without current pathological fracture: Secondary | ICD-10-CM | POA: Diagnosis not present

## 2024-02-23 DIAGNOSIS — Z6837 Body mass index (BMI) 37.0-37.9, adult: Secondary | ICD-10-CM | POA: Diagnosis not present

## 2024-02-23 DIAGNOSIS — E669 Obesity, unspecified: Secondary | ICD-10-CM | POA: Diagnosis not present

## 2024-03-19 DIAGNOSIS — Z86711 Personal history of pulmonary embolism: Secondary | ICD-10-CM | POA: Diagnosis not present

## 2024-03-19 DIAGNOSIS — Z7901 Long term (current) use of anticoagulants: Secondary | ICD-10-CM | POA: Diagnosis not present

## 2024-03-19 DIAGNOSIS — Z1211 Encounter for screening for malignant neoplasm of colon: Secondary | ICD-10-CM | POA: Diagnosis not present

## 2024-03-23 DIAGNOSIS — M810 Age-related osteoporosis without current pathological fracture: Secondary | ICD-10-CM | POA: Diagnosis not present

## 2024-03-23 DIAGNOSIS — E785 Hyperlipidemia, unspecified: Secondary | ICD-10-CM | POA: Diagnosis not present

## 2024-03-23 DIAGNOSIS — I10 Essential (primary) hypertension: Secondary | ICD-10-CM | POA: Diagnosis not present

## 2024-03-25 DIAGNOSIS — E669 Obesity, unspecified: Secondary | ICD-10-CM | POA: Diagnosis not present

## 2024-03-25 DIAGNOSIS — Z6836 Body mass index (BMI) 36.0-36.9, adult: Secondary | ICD-10-CM | POA: Diagnosis not present

## 2024-03-30 DIAGNOSIS — M545 Low back pain, unspecified: Secondary | ICD-10-CM | POA: Diagnosis not present

## 2024-04-08 DIAGNOSIS — Z1211 Encounter for screening for malignant neoplasm of colon: Secondary | ICD-10-CM | POA: Diagnosis not present

## 2024-04-08 DIAGNOSIS — Z1212 Encounter for screening for malignant neoplasm of rectum: Secondary | ICD-10-CM | POA: Diagnosis not present

## 2024-04-22 ENCOUNTER — Other Ambulatory Visit: Payer: Self-pay

## 2024-04-22 ENCOUNTER — Ambulatory Visit: Attending: Orthopedic Surgery | Admitting: Physical Therapy

## 2024-04-22 ENCOUNTER — Encounter: Payer: Self-pay | Admitting: Physical Therapy

## 2024-04-22 DIAGNOSIS — M5459 Other low back pain: Secondary | ICD-10-CM | POA: Insufficient documentation

## 2024-04-22 DIAGNOSIS — R293 Abnormal posture: Secondary | ICD-10-CM | POA: Insufficient documentation

## 2024-04-22 DIAGNOSIS — M6281 Muscle weakness (generalized): Secondary | ICD-10-CM | POA: Insufficient documentation

## 2024-04-22 NOTE — Therapy (Signed)
 OUTPATIENT PHYSICAL THERAPY THORACOLUMBAR EVALUATION   Patient Name: Cassandra [name] MRN: [id number] DOB:11-27-1945, 78 y.o., female Today's Date: 04/22/2024  END OF SESSION:  PT End of Session - 04/22/24 1445     Visit Number 1    Number of Visits 7    Date for PT Re-Evaluation 07/15/24    Authorization Type [employer]    Authorization Time Period 04/22/24 to 07/15/24    Progress Note Due on Visit 10    PT Start Time 1435    PT Stop Time 1517    PT Time Calculation (min) 42 min    Activity Tolerance Patient tolerated treatment well    Behavior During Therapy [facility] for tasks assessed/performed          Past Medical History:  Diagnosis Date   Arthritis    Cancer (HCC)    Breast cancer right '06- surgery, chemo, radiation- released after 5 yrs   Dyspnea    in 2020 had pe placed on eliquis    Hx of seasonal allergies    OTC med used   Hypertension    Hypothyroidism    Pulmonary embolism (HCC)    hx of in 2020 placed on eliquis    Sleep apnea    cpap   Past Surgical History:  Procedure Laterality Date   BREAST SURGERY Right    modified partial mastectomy/ lymph nodes dissection   COLON SURGERY  2/17   INCISIONAL HERNIA REPAIR N/A 04/06/2016   Procedure: LAPAROSCOPIC ASSISTED INCISIONAL HERNIA REPAIR WITH MESH;  Surgeon: Bernarda Ned, MD;  Location: [facility];  Service: General;  Laterality: N/A;   INCISIONAL HERNIA REPAIR N/A 08/21/2022   Procedure: REPAIR OF INCISIONAL HERNIA;  Surgeon: Vanderbilt Ned, MD;  Location: [facility] OR;  Service: General;  Laterality: N/A;  GEN/TAP BLOCK   INSERTION OF MESH N/A 08/21/2022   Procedure: INSERTION OF MESH;  Surgeon: Vanderbilt Ned, MD;  Location: [facility] OR;  Service: General;  Laterality: N/A;   TONSILLECTOMY     TOTAL KNEE ARTHROPLASTY Left 12/19/2020   Procedure: TOTAL KNEE ARTHROPLASTY;  Surgeon: Melodi Lerner, MD;  Location: [facility];  Service: Orthopedics;  Laterality: Left;    TOTAL KNEE ARTHROPLASTY Right 11/06/2021   Procedure:  TOTAL KNEE ARTHROPLASTY;  Surgeon: Melodi Lerner, MD;  Location: [facility];  Service: Orthopedics;  Laterality: Right;   Patient Active Problem List   Diagnosis Date Noted   Recurrent incisional hernia with obstruction 08/21/2022   OA (osteoarthritis) of knee 12/19/2020   Primary osteoarthritis of left knee 12/19/2020   Obstructive sleep apnea 06/19/2019   Malignant neoplasm of overlapping sites of right breast in female, estrogen receptor positive (HCC) 05/14/2019   Coagulopathy (HCC) 05/14/2019   Morbid obesity (HCC) 05/14/2019   Chronic thromboembolic disease (HCC) 04/08/2019   Pulmonary vascular disease (HCC) 04/08/2019   Hypothyroidism 04/07/2019   Essential hypertension 04/07/2019   Pulmonary embolism (HCC) 04/06/2019   Abnormal liver function 04/06/2019   Hypokalemia 04/06/2019   Incisional hernia 04/06/2016    PCP: Teresa Channel MD   REFERRING PROVIDER: Burnetta Aures, MD  REFERRING DIAG: M54.50 (ICD-10-CM) - Low back pain, unspecified  Rationale for Evaluation and Treatment: Rehabilitation  THERAPY DIAG:  Other low back pain  Muscle weakness (generalized)  Abnormal posture  ONSET DATE: a couple of months ago   SUBJECTIVE:  SUBJECTIVE STATEMENT:  Every now and then I get intermittent sharp twinges that are almost debilitating. One thing that seems to cause this is being twisted to watch TV instead of being straight on, pain comes on when I try to stand up. Some days are worse than other days. Haven't noticed any other patterns that cause pain. No numbness or pain going into legs, pain tends to be more on the right side. I'm a slug, trying to walk but knees are stiff but that's because I don't exercise enough.   PERTINENT HISTORY:  See above   PAIN:  Are you having pain? Yes:  NPRS scale: 1/10 Pain location: R side of low back  Pain description: like a pressure or something poking me in that spot  Aggravating factors: being twisted to watch TV, getting up  Relieving factors: changing position   PRECAUTIONS: None  RED FLAGS: None   WEIGHT BEARING RESTRICTIONS: No  FALLS:  Has patient fallen in last 6 months? 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    PATIENT SURVEYS:  PSFS: THE PATIENT SPECIFIC FUNCTIONAL SCALE  Place score of 0-10 (0 = unable to perform activity and 10 = able to perform activity at the same level as before injury or problem)  Activity Date: 04/22/24    Getting up from sitting position with pain is present  7    2. Getting around when pain starts/is present  7    3.     4.      Total Score 7      Total Score = Sum of activity scores/number of activities  Minimally Detectable Change: 3 points (for single activity); 2 points (for average score)  [facility] (nd). The Patient Specific Functional Scale . Retrieved from [email or web address]   COGNITION: Overall cognitive status: Within functional limits for tasks assessed     SENSATION: Not tested  MUSCLE LENGTH:  HS mild limitation B Piriformis mod limitation B   POSTURE: rounded shoulders and forward head    LUMBAR ROM:   AROM eval  Flexion 10% limited but stiff and slow   Extension WNL  Right lateral flexion WNL   Left lateral flexion WNL  Right rotation   Left  rotation    (Blank rows = not tested)    LOWER EXTREMITY MMT:    MMT Right eval Left eval  Hip flexion 4 4  Hip extension    Hip abduction 3 3  Hip adduction    Hip internal rotation    Hip external rotation    Knee flexion 4+ 4+  Knee extension 4+ 4+  Ankle dorsiflexion    Ankle plantarflexion    Ankle inversion    Ankle eversion     (Blank rows = not tested)    TREATMENT DATE:   04/22/24  Eval, POC, education and HEP practice as below   Nustep L5x8 minutes seat 7 all four extremities  PATIENT EDUCATION:  Education details: exam findings, POC, HEP  Person educated: Patient Education method: Explanation, Demonstration, and Handouts Education comprehension: verbalized understanding, returned demonstration, and needs further education  HOME EXERCISE PROGRAM: Access Code: URL: [email or web address] Date: 04/22/2024 Prepared by: Josette Rough  Exercises - Supine Lower Trunk Rotation  - 2 x daily - 7 x weekly - 1 sets - 10 reps - 3 seconds  hold - Supine Single Knee to Chest Stretch  - 2 x daily - 7 x weekly - 1 sets - 10 reps - 3 seconds  hold - Supine Bridge with Resistance Band  - 1 x daily - 7 x weekly - 2 sets - 10 reps - 1 second  hold - Clamshell with Resistance  - 1 x daily - 7 x weekly - 2 sets - 10 reps - 1 second  hold - Supine Transversus Abdominis Bracing - Hands on Stomach  - 1 x daily - 7 x weekly - 2 sets - 10 reps - 3 seconds  hold  ASSESSMENT:  CLINICAL IMPRESSION: Patient is a 78 y.o. F who was seen today for physical therapy evaluation and treatment for M54.50 (ICD-10-CM) - Low back pain, unspecified. Objectives as above, anticipate she will respond well to a targeted course of lumbar/core and hip strengthening moving forward, will also benefit from postural retraining program. She is requesting  focus on HEP that she can work on on her own with occasional check-ins with formal PT to make sure she is progressing as expected.   OBJECTIVE IMPAIRMENTS: decreased mobility, difficulty walking, decreased ROM, decreased strength, increased fascial restrictions, increased muscle spasms, impaired flexibility, improper body mechanics, postural dysfunction, obesity, and pain.   ACTIVITY LIMITATIONS: carrying, lifting, transfers, toileting, dressing, hygiene/grooming, and locomotion level  PARTICIPATION LIMITATIONS: meal prep, cleaning, laundry, community activity, and occupation  PERSONAL FACTORS: Age, Behavior pattern, Education, Fitness, and Time since onset of injury/illness/exacerbation are also affecting patient's functional outcome.   REHAB POTENTIAL: Good  CLINICAL DECISION MAKING: Stable/uncomplicated  EVALUATION COMPLEXITY: Low   GOALS: Goals reviewed with patient? No  SHORT TERM GOALS: Target date: 05/06/2024    Will be compliant with appropriate progressive HEP  Baseline: Goal status: INITIAL      LONG TERM GOALS: Target date: 07/15/2024    MMT to have improved by one grade all weak groups  Baseline:  Goal status: INITIAL  2.  Lumbar AROM and hip flexibility to have normalized  Baseline:  Goal status: INITIAL  3.  Pain frequency and intensity to have improved by at least 75%  Baseline:  Goal status: INITIAL  4.  PSFS to have improved by at least 2 points  Baseline:  Goal status: INITIAL    PLAN:  PT FREQUENCY: every other week  PT DURATION: 12 weeks  PLANNED INTERVENTIONS: 97750- Physical Performance Testing, 97110-Therapeutic exercises, 97530- Therapeutic activity, [id number]- Neuromuscular re-education, 97535- Self Care, [id number]- Manual therapy, and [id number]- Aquatic Therapy.  PLAN FOR NEXT SESSION: focus on lumbar/hip mobility, proximal and core strength. HEP updates every session, only coming once every 2 weeks   Josette Rough, PT, DPT 04/22/24 3:24  PM

## 2024-04-23 DIAGNOSIS — M810 Age-related osteoporosis without current pathological fracture: Secondary | ICD-10-CM | POA: Diagnosis not present

## 2024-04-23 DIAGNOSIS — E785 Hyperlipidemia, unspecified: Secondary | ICD-10-CM | POA: Diagnosis not present

## 2024-04-23 DIAGNOSIS — I10 Essential (primary) hypertension: Secondary | ICD-10-CM | POA: Diagnosis not present

## 2024-04-25 DIAGNOSIS — Z6836 Body mass index (BMI) 36.0-36.9, adult: Secondary | ICD-10-CM | POA: Diagnosis not present

## 2024-04-25 DIAGNOSIS — E669 Obesity, unspecified: Secondary | ICD-10-CM | POA: Diagnosis not present

## 2024-05-06 ENCOUNTER — Encounter: Payer: Self-pay | Admitting: Physical Therapy

## 2024-05-06 ENCOUNTER — Ambulatory Visit: Attending: Orthopedic Surgery | Admitting: Physical Therapy

## 2024-05-06 DIAGNOSIS — M5459 Other low back pain: Secondary | ICD-10-CM | POA: Diagnosis not present

## 2024-05-06 DIAGNOSIS — M6281 Muscle weakness (generalized): Secondary | ICD-10-CM | POA: Diagnosis not present

## 2024-05-06 DIAGNOSIS — R293 Abnormal posture: Secondary | ICD-10-CM | POA: Diagnosis not present

## 2024-05-06 NOTE — Therapy (Signed)
 OUTPATIENT PHYSICAL THERAPY THORACOLUMBAR TREATMENT    Patient Name: Cassandra [name] MRN: [id number] DOB:10-31-1945, 78 y.o., female Today's Date: 05/06/2024  END OF SESSION:  PT End of Session - 05/06/24 1358     Visit Number 2    Number of Visits 7    Date for PT Re-Evaluation 07/15/24    Authorization Type [employer]    Authorization Time Period 04/22/24 to 07/15/24    Progress Note Due on Visit 10    PT Start Time 1350    PT Stop Time 1429    PT Time Calculation (min) 39 min    Activity Tolerance Patient tolerated treatment well    Behavior During Therapy [facility] for tasks assessed/performed           Past Medical History:  Diagnosis Date   Arthritis    Cancer (HCC)    Breast cancer right '06- surgery, chemo, radiation- released after 5 yrs   Dyspnea    in 2020 had pe placed on eliquis    Hx of seasonal allergies    OTC med used   Hypertension    Hypothyroidism    Pulmonary embolism (HCC)    hx of in 2020 placed on eliquis    Sleep apnea    cpap   Past Surgical History:  Procedure Laterality Date   BREAST SURGERY Right    modified partial mastectomy/ lymph nodes dissection   COLON SURGERY  2/17   INCISIONAL HERNIA REPAIR N/A 04/06/2016   Procedure: LAPAROSCOPIC ASSISTED INCISIONAL HERNIA REPAIR WITH MESH;  Surgeon: Bernarda Ned, MD;  Location: [facility];  Service: General;  Laterality: N/A;   INCISIONAL HERNIA REPAIR N/A 08/21/2022   Procedure: REPAIR OF INCISIONAL HERNIA;  Surgeon: Vanderbilt Ned, MD;  Location: [facility];  Service: General;  Laterality: N/A;  GEN/TAP BLOCK   INSERTION OF MESH N/A 08/21/2022   Procedure: INSERTION OF MESH;  Surgeon: Vanderbilt Ned, MD;  Location: [facility];  Service: General;  Laterality: N/A;   TONSILLECTOMY     TOTAL KNEE ARTHROPLASTY Left 12/19/2020   Procedure: TOTAL KNEE ARTHROPLASTY;  Surgeon: Melodi Lerner, MD;  Location: [facility];  Service: Orthopedics;  Laterality: Left;    TOTAL KNEE ARTHROPLASTY Right 11/06/2021   Procedure:  TOTAL KNEE ARTHROPLASTY;  Surgeon: Melodi Lerner, MD;  Location: [facility];  Service: Orthopedics;  Laterality: Right;   Patient Active Problem List   Diagnosis Date Noted   Recurrent incisional hernia with obstruction 08/21/2022   OA (osteoarthritis) of knee 12/19/2020   Primary osteoarthritis of left knee 12/19/2020   Obstructive sleep apnea 06/19/2019   Malignant neoplasm of overlapping sites of right breast in female, estrogen receptor positive (HCC) 05/14/2019   Coagulopathy (HCC) 05/14/2019   Morbid obesity (HCC) 05/14/2019   Chronic thromboembolic disease (HCC) 04/08/2019   Pulmonary vascular disease (HCC) 04/08/2019   Hypothyroidism 04/07/2019   Essential hypertension 04/07/2019   Pulmonary embolism (HCC) 04/06/2019   Abnormal liver function 04/06/2019   Hypokalemia 04/06/2019   Incisional hernia 04/06/2016    PCP: Teresa Channel MD   REFERRING PROVIDER: Burnetta Aures, MD  REFERRING DIAG: M54.50 (ICD-10-CM) - Low back pain, unspecified  Rationale for Evaluation and Treatment: Rehabilitation  THERAPY DIAG:  Other low back pain  Muscle weakness (generalized)  Abnormal posture  ONSET DATE: a couple of months ago   SUBJECTIVE:  SUBJECTIVE STATEMENT:  I was sore for a couple of days after you saw me, I did HEP one day and then lost the sheets. My house ate them.      EVAL: Every now and then I get intermittent sharp twinges that are almost debilitating. One thing that seems to cause this is being twisted to watch TV instead of being straight on, pain comes on when I try to stand up. Some days are worse than other days. Haven't noticed any other patterns that cause pain. No numbness or pain going into legs, pain tends to be more on the right side. I'm a slug, trying to walk but knees  are stiff but that's because I don't exercise enough.   PERTINENT HISTORY:  See above   PAIN:  Are you having pain? Yes: NPRS scale: lessened/10 no number given on NPRS  Pain location: R side of low back  Pain description: burning sort of feeling  Aggravating factors: being twisted to watch TV, getting up  Relieving factors: changing position   PRECAUTIONS: None  RED FLAGS: None   WEIGHT BEARING RESTRICTIONS: No  FALLS:  Has patient fallen in last 6 months? 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    PATIENT SURVEYS:  PSFS: THE PATIENT SPECIFIC FUNCTIONAL SCALE  Place score of 0-10 (0 = unable to perform activity and 10 = able to perform activity at the same level as before injury or problem)  Activity Date: 04/22/24    Getting up from sitting position with pain is present  7    2. Getting around when pain starts/is present  7    3.     4.      Total Score 7      Total Score = Sum of activity scores/number of activities  Minimally Detectable Change: 3 points (for single activity); 2 points (for average score)  [facility] (nd). The Patient Specific Functional Scale . Retrieved from [email or web address]   COGNITION: Overall cognitive status: Within functional limits for tasks assessed     SENSATION: Not tested  MUSCLE LENGTH:  HS mild limitation B Piriformis mod limitation B   POSTURE: rounded shoulders and forward head    LUMBAR ROM:   AROM eval  Flexion 10%  limited but stiff and slow   Extension WNL  Right lateral flexion WNL   Left lateral flexion WNL  Right rotation   Left rotation    (Blank rows = not tested)    LOWER EXTREMITY MMT:    MMT Right eval Left eval  Hip flexion 4 4  Hip extension    Hip abduction 3 3  Hip adduction    Hip internal rotation    Hip external rotation    Knee flexion 4+ 4+  Knee extension 4+ 4+  Ankle dorsiflexion    Ankle plantarflexion    Ankle inversion    Ankle eversion     (Blank rows = not tested)    TREATMENT DATE:   05/06/24  Scifit bike L1x6 minutes (nustep not available)  Lumbar rotation stretch 5x5 seconds B  SKTC 5x5 seconds B Bridges + ABD into red TB x10 B Sidelying clams red TB x10  PPT 12x3 second holds  PPT + march x12  Hooklying HS stretch 2x30 seconds B  Standing hip ABD red TB x10 B Standing marches red TB x10 B Standing hip extensions red TB x10 B Hip hikes x12 B     04/22/24  Eval, POC, education and HEP practice as below   Nustep L5x8 minutes seat 7 all four extremities                                                                                                                                  PATIENT EDUCATION:  Education details: exam findings, POC, HEP  Person educated: Patient Education method: Programmer, multimedia, Demonstration, and Handouts Education comprehension: verbalized understanding, returned demonstration, and needs further education  HOME EXERCISE PROGRAM: Access Code: URL: [email or web address] Date: 04/22/2024 Prepared by: Josette Rough  Exercises - Supine Lower Trunk Rotation  - 2 x daily - 7 x weekly - 1 sets - 10 reps - 3 seconds  hold - Supine Single Knee to Chest Stretch  - 2 x daily - 7 x weekly - 1 sets - 10 reps - 3 seconds  hold - Supine Bridge with Resistance Band  - 1 x daily - 7 x weekly - 2 sets - 10 reps - 1 second  hold - Clamshell with Resistance  - 1 x daily - 7 x weekly - 2 sets - 10 reps - 1  second  hold - Supine Transversus Abdominis Bracing - Hands on Stomach  - 1 x daily - 7 x weekly - 2 sets - 10 reps - 3 seconds  hold  ASSESSMENT:  CLINICAL IMPRESSION:  Arrives today doing OK, she has not been compliant with HEP due to having lost her sheet. Did not update HEP, printed her multiple copies of original program today and encouraged regular performance. Otherwise kept working on strength and mobility/flexibility as per POC. Will continue to work towards goals.     EVAL: Patient is a 78 y.o. F who was seen today for physical therapy evaluation and treatment for M54.50 (ICD-10-CM) - Low back pain, unspecified. Objectives as above, anticipate she will respond well to a targeted course of lumbar/core and hip strengthening moving forward, will also benefit from postural retraining program. She is requesting focus on HEP that she can work on on her own with occasional check-ins with formal PT to make sure she is progressing as expected.   OBJECTIVE IMPAIRMENTS: decreased mobility, difficulty walking, decreased ROM, decreased strength, increased fascial restrictions, increased muscle spasms, impaired flexibility, improper body mechanics, postural dysfunction, obesity, and pain.   ACTIVITY LIMITATIONS: carrying, lifting, transfers, toileting, dressing, hygiene/grooming, and locomotion level  PARTICIPATION LIMITATIONS: meal prep, cleaning, laundry, community activity, and occupation  PERSONAL FACTORS: Age, Behavior pattern, Education, Fitness, and Time since onset of injury/illness/exacerbation are also affecting patient's functional outcome.  REHAB POTENTIAL: Good  CLINICAL DECISION MAKING: Stable/uncomplicated  EVALUATION COMPLEXITY: Low   GOALS: Goals reviewed with patient? No  SHORT TERM GOALS: Target date: 05/06/2024    Will be compliant with appropriate progressive HEP  Baseline: Goal status: ONGOING 05/06/24      LONG TERM GOALS: Target date: 07/15/2024    MMT  to have improved by one grade all weak groups  Baseline:  Goal status: INITIAL  2.  Lumbar AROM and hip flexibility to have normalized  Baseline:  Goal status: INITIAL  3.  Pain frequency and intensity to have improved by at least 75%  Baseline:  Goal status: INITIAL  4.  PSFS to have improved by at least 2 points  Baseline:  Goal status: INITIAL    PLAN:  PT FREQUENCY: every other week  PT DURATION: 12 weeks  PLANNED INTERVENTIONS: 97750- Physical Performance Testing, 97110-Therapeutic exercises, 97530- Therapeutic activity, [id number]- Neuromuscular re-education, 97535- Self Care, [id number]- Manual therapy, and [id number]- Aquatic Therapy.  PLAN FOR NEXT SESSION: focus on lumbar/hip mobility, proximal and core strength. HEP updates as appropriate, only coming once every 2 weeks but still need to encourage compliance with this   Josette Rough, PT, DPT 05/06/24 2:32 PM

## 2024-05-20 ENCOUNTER — Encounter: Payer: Self-pay | Admitting: Physical Therapy

## 2024-05-20 ENCOUNTER — Ambulatory Visit: Admitting: Physical Therapy

## 2024-05-20 DIAGNOSIS — M5459 Other low back pain: Secondary | ICD-10-CM | POA: Diagnosis not present

## 2024-05-20 DIAGNOSIS — M6281 Muscle weakness (generalized): Secondary | ICD-10-CM

## 2024-05-20 DIAGNOSIS — R293 Abnormal posture: Secondary | ICD-10-CM | POA: Diagnosis not present

## 2024-05-20 NOTE — Therapy (Signed)
 OUTPATIENT PHYSICAL THERAPY THORACOLUMBAR TREATMENT    Patient Name: Cassandra [name] MRN: [id number] DOB:[birth year], 78 y.o., female Today's Date: 05/20/2024  END OF SESSION:  PT End of Session - 05/20/24 1348     Visit Number 3    Date for PT Re-Evaluation 07/15/24    PT Start Time 1347    PT Stop Time 1430    PT Time Calculation (min) 43 min    Activity Tolerance Patient tolerated treatment well    Behavior During Therapy [facility] for tasks assessed/performed           Past Medical History:  Diagnosis Date   Arthritis    Cancer (HCC)    Breast cancer right '06- surgery, chemo, radiation- released after 5 yrs   Dyspnea    in 2020 had pe placed on eliquis    Hx of seasonal allergies    OTC med used   Hypertension    Hypothyroidism    Pulmonary embolism (HCC)    hx of in 2020 placed on eliquis    Sleep apnea    cpap   Past Surgical History:  Procedure Laterality Date   BREAST SURGERY Right    modified partial mastectomy/ lymph nodes dissection   COLON SURGERY  2/17   INCISIONAL HERNIA REPAIR N/A 04/06/2016   Procedure: LAPAROSCOPIC ASSISTED INCISIONAL HERNIA REPAIR WITH MESH;  Surgeon: Bernarda Ned, MD;  Location: [facility];  Service: General;  Laterality: N/A;   INCISIONAL HERNIA REPAIR N/A 08/21/2022   Procedure: REPAIR OF INCISIONAL HERNIA;  Surgeon: Vanderbilt Ned, MD;  Location: [facility];  Service: General;  Laterality: N/A;  GEN/TAP BLOCK   INSERTION OF MESH N/A 08/21/2022   Procedure: INSERTION OF MESH;  Surgeon: Vanderbilt Ned, MD;  Location: [facility];  Service: General;  Laterality: N/A;   TONSILLECTOMY     TOTAL KNEE ARTHROPLASTY Left 12/19/2020   Procedure: TOTAL KNEE ARTHROPLASTY;  Surgeon: Melodi Lerner, MD;  Location: [facility];  Service: Orthopedics;  Laterality: Left;    TOTAL KNEE ARTHROPLASTY Right 11/06/2021   Procedure: TOTAL KNEE ARTHROPLASTY;  Surgeon: Melodi Lerner, MD;  Location: [facility];  Service: Orthopedics;  Laterality: Right;   Patient Active  Problem List   Diagnosis Date Noted   Recurrent incisional hernia with obstruction 08/21/2022   OA (osteoarthritis) of knee 12/19/2020   Primary osteoarthritis of left knee 12/19/2020   Obstructive sleep apnea 06/19/2019   Malignant neoplasm of overlapping sites of right breast in female, estrogen receptor positive (HCC) 05/14/2019   Coagulopathy (HCC) 05/14/2019   Morbid obesity (HCC) 05/14/2019   Chronic thromboembolic disease (HCC) 04/08/2019   Pulmonary vascular disease (HCC) 04/08/2019   Hypothyroidism 04/07/2019   Essential hypertension 04/07/2019   Pulmonary embolism (HCC) 04/06/2019   Abnormal liver function 04/06/2019   Hypokalemia 04/06/2019   Incisional hernia 04/06/2016    PCP: Teresa Channel MD   REFERRING PROVIDER: Burnetta Aures, MD  REFERRING DIAG: M54.50 (ICD-10-CM) - Low back pain, unspecified  Rationale for Evaluation and Treatment: Rehabilitation  THERAPY DIAG:  Other low back pain  Muscle weakness (generalized)  Abnormal posture  ONSET DATE: a couple of months ago   SUBJECTIVE:  SUBJECTIVE STATEMENT:  Did some lawn mower repair yesterday so a little stiff today     EVAL: Every now and then I get intermittent sharp twinges that are almost debilitating. One thing that seems to cause this is being twisted to watch TV instead of being straight on, pain comes on when I try to stand up. Some days are worse than other days. Haven't noticed any other patterns that cause pain. No numbness or pain going into legs, pain tends to be more on the right side. I'm a slug, trying to walk but knees are stiff but that's because I don't exercise enough.   PERTINENT HISTORY:  See above   PAIN:  Are you having pain? Yes: NPRS scale: lessened/10 no number given on NPRS  Pain  location: R side of low back  Pain description: burning sort of feeling  Aggravating factors: being twisted to watch TV, getting up  Relieving factors: changing position   PRECAUTIONS: None  RED FLAGS: None   WEIGHT BEARING RESTRICTIONS: No  FALLS:  Has patient fallen in last 6 months? 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    PATIENT SURVEYS:  PSFS: THE PATIENT SPECIFIC FUNCTIONAL SCALE  Place score of 0-10 (0 = unable to perform activity and 10 = able to perform activity at the same level as before injury or problem)  Activity Date: 04/22/24    Getting up from sitting position with pain is present  7    2. Getting around when pain starts/is present  7    3.     4.      Total Score 7      Total Score = Sum of activity scores/number of activities  Minimally Detectable Change: 3 points (for single activity); 2 points (for average score)  [facility] (nd). The Patient Specific Functional Scale . Retrieved from [email or web address]   COGNITION: Overall cognitive status: Within functional limits for tasks assessed     SENSATION: Not tested  MUSCLE LENGTH:  HS mild limitation B Piriformis mod limitation B   POSTURE: rounded shoulders and forward head    LUMBAR ROM:   AROM eval  Flexion 10% limited but stiff and slow   Extension WNL  Right lateral flexion WNL   Left lateral flexion WNL  Right rotation   Left rotation    (Blank rows = not tested)    LOWER  EXTREMITY MMT:    MMT Right eval Left eval  Hip flexion 4 4  Hip extension    Hip abduction 3 3  Hip adduction    Hip internal rotation    Hip external rotation    Knee flexion 4+ 4+  Knee extension 4+ 4+  Ankle dorsiflexion    Ankle plantarflexion    Ankle inversion    Ankle eversion     (Blank rows = not tested)    TREATMENT DATE:  05/20/24 Bike L2.5 x3 min NuStep L5 x 4 min Shoulder Ext 5lb 2x10 Rows 5lb 2x10  Sit to stand holding yellow ball 2x10 Horiz abd green 2x10 Bridges x10  LE  on pball bridges, K2C, obl  Passive stretching to bilateral piriformis, HS, Glute, ITB  05/06/24  Scifit bike L1x6 minutes (nustep not available)  Lumbar rotation stretch 5x5 seconds B SKTC 5x5 seconds B Bridges + ABD into red TB x10 B Sidelying clams red TB x10  PPT 12x3 second holds  PPT + march x12  Hooklying HS stretch 2x30 seconds B  Standing hip ABD red TB x10 B Standing marches red TB x10 B Standing hip extensions red TB x10 B Hip hikes x12 B     04/22/24  Eval, POC, education and HEP practice as below   Nustep L5x8 minutes seat 7 all four extremities                                                                                                                                  PATIENT EDUCATION:  Education details: exam findings, POC, HEP  Person educated: Patient Education method: Programmer, multimedia, Demonstration, and Handouts Education comprehension: verbalized understanding, returned demonstration, and needs further education  HOME EXERCISE PROGRAM: Access Code: URL: [email or web address] Date: 04/22/2024 Prepared by: Josette Rough  Exercises - Supine Lower Trunk Rotation  - 2 x daily - 7 x weekly - 1 sets - 10 reps - 3 seconds  hold - Supine Single Knee to Chest Stretch  - 2 x daily - 7 x weekly - 1 sets - 10 reps - 3 seconds  hold - Supine Bridge with Resistance Band  - 1 x daily - 7 x weekly - 2 sets - 10 reps - 1 second  hold -  Clamshell with Resistance  - 1 x daily - 7 x weekly - 2 sets - 10 reps - 1 second  hold - Supine Transversus Abdominis Bracing - Hands on Stomach  - 1 x daily - 7 x weekly - 2 sets - 10 reps - 3 seconds  hold  ASSESSMENT:  CLINICAL IMPRESSION:  Arrives today doing OK , kept working on strength and mobility/flexibility as per POC. Tactile cue for posture needed with shoulder Ext and rows. Bilateral HS tightness with passive stretching. No pain reported during session. Will continue to work towards goals.     EVAL: Patient is a 78 y.o. F who was seen today for physical therapy evaluation and treatment for M54.50 (ICD-10-CM) - Low back pain, unspecified. Objectives as above, anticipate she will respond well to a targeted course of lumbar/core and hip strengthening moving forward, will also benefit from postural retraining program. She is requesting focus on HEP that she can work on on her own with occasional check-ins with formal PT to make sure she is progressing as expected.   OBJECTIVE IMPAIRMENTS: decreased mobility, difficulty walking, decreased ROM, decreased strength, increased fascial restrictions, increased muscle spasms, impaired flexibility, improper body mechanics, postural dysfunction, obesity, and pain.   ACTIVITY LIMITATIONS: carrying, lifting, transfers, toileting, dressing, hygiene/grooming, and locomotion level  PARTICIPATION LIMITATIONS: meal prep, cleaning, laundry, community  activity, and occupation  PERSONAL FACTORS: Age, Behavior pattern, Education, Fitness, and Time since onset of injury/illness/exacerbation are also affecting patient's functional outcome.   REHAB POTENTIAL: Good  CLINICAL DECISION MAKING: Stable/uncomplicated  EVALUATION COMPLEXITY: Low   GOALS: Goals reviewed with patient? No  SHORT TERM GOALS: Target date: 05/06/2024    Will be compliant with appropriate progressive HEP  Baseline: Goal status: Progressing 05/20/24      LONG TERM GOALS:  Target date: 07/15/2024    MMT to have improved by one grade all weak groups  Baseline:  Goal status: INITIAL  2.  Lumbar AROM and hip flexibility to have normalized  Baseline:  Goal status: INITIAL  3.  Pain frequency and intensity to have improved by at least 75%  Baseline:  Goal status: INITIAL  4.  PSFS to have improved by at least 2 points  Baseline:  Goal status: INITIAL    PLAN:  PT FREQUENCY: every other week  PT DURATION: 12 weeks  PLANNED INTERVENTIONS: 97750- Physical Performance Testing, 97110-Therapeutic exercises, 97530- Therapeutic activity, [id number]- Neuromuscular re-education, 97535- Self Care, 02859- Manual therapy, and [id number]- Aquatic Therapy.  PLAN FOR NEXT SESSION: focus on lumbar/hip mobility, proximal and core strength. HEP updates as appropriate, only coming once every 2 weeks but still need to encourage compliance with this   Tanda Sorrow, PTA 05/20/24 1:49 PM

## 2024-05-24 DIAGNOSIS — E785 Hyperlipidemia, unspecified: Secondary | ICD-10-CM | POA: Diagnosis not present

## 2024-05-24 DIAGNOSIS — I10 Essential (primary) hypertension: Secondary | ICD-10-CM | POA: Diagnosis not present

## 2024-05-24 DIAGNOSIS — M810 Age-related osteoporosis without current pathological fracture: Secondary | ICD-10-CM | POA: Diagnosis not present

## 2024-05-25 DIAGNOSIS — E669 Obesity, unspecified: Secondary | ICD-10-CM | POA: Diagnosis not present

## 2024-05-25 DIAGNOSIS — Z6837 Body mass index (BMI) 37.0-37.9, adult: Secondary | ICD-10-CM | POA: Diagnosis not present

## 2024-06-03 ENCOUNTER — Ambulatory Visit: Attending: Orthopedic Surgery | Admitting: Physical Therapy

## 2024-06-03 ENCOUNTER — Encounter: Payer: Self-pay | Admitting: Physical Therapy

## 2024-06-03 DIAGNOSIS — M5459 Other low back pain: Secondary | ICD-10-CM | POA: Diagnosis not present

## 2024-06-03 DIAGNOSIS — M6281 Muscle weakness (generalized): Secondary | ICD-10-CM | POA: Insufficient documentation

## 2024-06-03 DIAGNOSIS — R293 Abnormal posture: Secondary | ICD-10-CM | POA: Diagnosis not present

## 2024-06-03 NOTE — Therapy (Signed)
 OUTPATIENT PHYSICAL THERAPY THORACOLUMBAR TREATMENT    Patient Name: Cassandra [name] MRN: [id number] DOB:1945-11-17, 78 y.o., female Today's Date: 06/03/2024  END OF SESSION:  PT End of Session - 06/03/24 1349     Visit Number 4    Date for PT Re-Evaluation 07/15/24    PT Start Time 1347    PT Stop Time 1430    PT Time Calculation (min) 43 min    Activity Tolerance Patient tolerated treatment well    Behavior During Therapy [facility] for tasks assessed/performed           Past Medical History:  Diagnosis Date   Arthritis    Cancer (HCC)    Breast cancer right '06- surgery, chemo, radiation- released after 5 yrs   Dyspnea    in 2020 had pe placed on eliquis    Hx of seasonal allergies    OTC med used   Hypertension    Hypothyroidism    Pulmonary embolism (HCC)    hx of in 2020 placed on eliquis    Sleep apnea    cpap   Past Surgical History:  Procedure Laterality Date   BREAST SURGERY Right    modified partial mastectomy/ lymph nodes dissection   COLON SURGERY  2/17   INCISIONAL HERNIA REPAIR N/A 04/06/2016   Procedure: LAPAROSCOPIC ASSISTED INCISIONAL HERNIA REPAIR WITH MESH;  Surgeon: Bernarda Ned, MD;  Location: [facility];  Service: General;  Laterality: N/A;   INCISIONAL HERNIA REPAIR N/A 08/21/2022   Procedure: REPAIR OF INCISIONAL HERNIA;  Surgeon: Vanderbilt Ned, MD;  Location: [facility];  Service: General;  Laterality: N/A;  GEN/TAP BLOCK   INSERTION OF MESH N/A 08/21/2022   Procedure: INSERTION OF MESH;  Surgeon: Vanderbilt Ned, MD;  Location: [facility];  Service: General;  Laterality: N/A;   TONSILLECTOMY     TOTAL KNEE ARTHROPLASTY Left 12/19/2020   Procedure: TOTAL KNEE ARTHROPLASTY;  Surgeon: Melodi Lerner, MD;  Location: [facility];  Service: Orthopedics;  Laterality: Left;    TOTAL KNEE ARTHROPLASTY Right 11/06/2021   Procedure: TOTAL KNEE ARTHROPLASTY;  Surgeon: Melodi Lerner, MD;  Location: [facility];  Service: Orthopedics;  Laterality: Right;   Patient Active  Problem List   Diagnosis Date Noted   Recurrent incisional hernia with obstruction 08/21/2022   OA (osteoarthritis) of knee 12/19/2020   Primary osteoarthritis of left knee 12/19/2020   Obstructive sleep apnea 06/19/2019   Malignant neoplasm of overlapping sites of right breast in female, estrogen receptor positive (HCC) 05/14/2019   Coagulopathy (HCC) 05/14/2019   Morbid obesity (HCC) 05/14/2019   Chronic thromboembolic disease (HCC) 04/08/2019   Pulmonary vascular disease (HCC) 04/08/2019   Hypothyroidism 04/07/2019   Essential hypertension 04/07/2019   Pulmonary embolism (HCC) 04/06/2019   Abnormal liver function 04/06/2019   Hypokalemia 04/06/2019   Incisional hernia 04/06/2016    PCP: Teresa Channel MD   REFERRING PROVIDER: Burnetta Aures, MD  REFERRING DIAG: M54.50 (ICD-10-CM) - Low back pain, unspecified  Rationale for Evaluation and Treatment: Rehabilitation  THERAPY DIAG:  Muscle weakness (generalized)  Abnormal posture  Other low back pain  ONSET DATE: a couple of months ago   SUBJECTIVE:  SUBJECTIVE STATEMENT:  Good     EVAL: Every now and then I get intermittent sharp twinges that are almost debilitating. One thing that seems to cause this is being twisted to watch TV instead of being straight on, pain comes on when I try to stand up. Some days are worse than other days. Haven't noticed any other patterns that cause pain. No numbness or pain going into legs, pain tends to be more on the right side. I'm a slug, trying to walk but knees are stiff but that's because I don't exercise enough.   PERTINENT HISTORY:  See above   PAIN:  Are you having pain? Yes: NPRS scale: 0/10 Pain location: R side of low back  Pain description: burning sort of feeling  Aggravating  factors: being twisted to watch TV, getting up  Relieving factors: changing position   PRECAUTIONS: None  RED FLAGS: None   WEIGHT BEARING RESTRICTIONS: No  FALLS:  Has patient fallen in last 6 months? 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    PATIENT SURVEYS:  PSFS: THE PATIENT SPECIFIC FUNCTIONAL SCALE  Place score of 0-10 (0 = unable to perform activity and 10 = able to perform activity at the same level as before injury or problem)  Activity Date: 04/22/24 06/03/24   Getting up from sitting position with pain is present  7 8   2. Getting around when pain starts/is present  7 8   3.     4.      Total Score 7 8     Total Score = Sum of activity scores/number of activities  Minimally Detectable Change: 3 points (for single activity); 2 points (for average score)  [facility] (nd). The Patient Specific Functional Scale . Retrieved from [email or web address]   COGNITION: Overall cognitive status: Within functional limits for tasks assessed     SENSATION: Not tested  MUSCLE LENGTH:  HS mild limitation B Piriformis mod limitation B   POSTURE: rounded shoulders and forward head    LUMBAR ROM:   AROM eval 06/03/24  Flexion 10% limited but stiff and slow  WFL  Extension WNL WFL  Right lateral flexion WNL  WFL  Left lateral flexion WNL WFL  Right rotation    Left rotation     (Blank rows = not tested)    LOWER EXTREMITY MMT:    MMT Right eval Left eval  Hip  flexion 4 4  Hip extension    Hip abduction 3 3  Hip adduction    Hip internal rotation    Hip external rotation    Knee flexion 4+ 4+  Knee extension 4+ 4+  Ankle dorsiflexion    Ankle plantarflexion    Ankle inversion    Ankle eversion     (Blank rows = not tested)    TREATMENT DATE:  06/03/24 Bike L3 x4 min Gait outside around two side island on L side and front [employer]  Sit to stand holding blue ball 2x10 Rows and lats 25lb 2x10  Shoulder Ext 5lb 2x10 Seated OHP yellow ball 2x10 Seated  trunk rotation w/ yellow ball 2x10 Shoulder ER green   05/20/24 Bike L2.5 x3 min NuStep L5 x 4 min Shoulder Ext 5lb 2x10 Rows 5lb 2x10  Sit to stand holding yellow ball 2x10 Horiz abd green 2x10 Bridges x10  LE on pball bridges, K2C, obl  Passive stretching to bilateral piriformis, HS, Glute, ITB  05/06/24  Scifit bike L1x6 minutes (nustep not available)  Lumbar rotation stretch 5x5 seconds B SKTC 5x5 seconds B Bridges + ABD into red TB x10 B Sidelying clams red TB x10  PPT 12x3 second holds  PPT + march x12  Hooklying HS stretch 2x30 seconds B  Standing hip ABD red TB x10 B Standing marches red TB x10 B Standing hip extensions red TB x10 B Hip hikes x12 B     04/22/24  Eval, POC, education and HEP practice as below   Nustep L5x8 minutes seat 7 all four extremities                                                                                                                                  PATIENT EDUCATION:  Education details: exam findings, POC, HEP  Person educated: Patient Education method: Programmer, multimedia, Demonstration, and Handouts Education comprehension: verbalized understanding, returned demonstration, and needs further education  HOME EXERCISE PROGRAM: Access Code: URL: [email or web address] Date: 04/22/2024 Prepared by: Josette Rough  Exercises - Supine Lower Trunk Rotation  - 2 x daily - 7 x weekly - 1 sets - 10 reps  - 3 seconds  hold - Supine Single Knee to Chest Stretch  - 2 x daily - 7 x weekly - 1 sets - 10 reps - 3 seconds  hold - Supine Bridge with Resistance Band  - 1 x daily - 7 x weekly - 2 sets - 10 reps - 1 second  hold - Clamshell with Resistance  - 1 x daily - 7 x weekly - 2 sets - 10 reps - 1 second  hold - Supine Transversus Abdominis Bracing - Hands on Stomach  - 1 x daily - 7 x weekly - 2 sets - 10 reps - 3 seconds  hold  ASSESSMENT:  CLINICAL IMPRESSION:  Arrives doing good with no pain  She has progressed meeting some goals., kept working on strength and mobility/flexibility as per POC. Tactile cue for posture needed with shoulder Ext. Pt did well with machine level interventions.  No pain reported during session. Will continue to work towards goals.     EVAL: Patient is a 78 y.o. F who was seen today for physical therapy evaluation and treatment for M54.50 (ICD-10-CM) - Low back pain, unspecified. Objectives as above, anticipate she will respond well to a targeted course of lumbar/core and hip strengthening moving forward, will also benefit from postural retraining program. She is requesting focus on HEP that she can work on on her own with occasional check-ins with formal PT to make sure she is  progressing as expected.   OBJECTIVE IMPAIRMENTS: decreased mobility, difficulty walking, decreased ROM, decreased strength, increased fascial restrictions, increased muscle spasms, impaired flexibility, improper body mechanics, postural dysfunction, obesity, and pain.   ACTIVITY LIMITATIONS: carrying, lifting, transfers, toileting, dressing, hygiene/grooming, and locomotion level  PARTICIPATION LIMITATIONS: meal prep, cleaning, laundry, community activity, and occupation  PERSONAL FACTORS: Age, Behavior pattern, Education, Fitness, and Time since onset of injury/illness/exacerbation are also affecting patient's functional outcome.   REHAB POTENTIAL: Good  CLINICAL DECISION MAKING:  Stable/uncomplicated  EVALUATION COMPLEXITY: Low   GOALS: Goals reviewed with patient? No  SHORT TERM GOALS: Target date: 05/06/2024    Will be compliant with appropriate progressive HEP  Baseline: Goal status: Met 06/03/24      LONG TERM GOALS: Target date: 07/15/2024    MMT to have improved by one grade all weak groups  Baseline:  Goal status: INITIAL  2.  Lumbar AROM and hip flexibility to have normalized  Baseline:  Goal status: Partly met 06/03/24  3.  Pain frequency and intensity to have improved by at least 75%  Baseline:  Goal status: INITIAL  4.  PSFS to have improved by at least 2 points  Baseline:  Goal status: Progressing 06/03/24    PLAN:  PT FREQUENCY: every other week  PT DURATION: 12 weeks  PLANNED INTERVENTIONS: 97750- Physical Performance Testing, 97110-Therapeutic exercises, 97530- Therapeutic activity, [id number]- Neuromuscular re-education, 97535- Self Care, [id number]- Manual therapy, and [id number]- Aquatic Therapy.  PLAN FOR NEXT SESSION: focus on lumbar/hip mobility, proximal and core strength. HEP updates as appropriate, only coming once every 2 weeks but still need to encourage compliance with this   Tanda Sorrow, PTA 06/03/24 1:50 PM

## 2024-06-17 ENCOUNTER — Ambulatory Visit: Admitting: Physical Therapy

## 2024-06-17 ENCOUNTER — Encounter: Payer: Self-pay | Admitting: Physical Therapy

## 2024-06-17 DIAGNOSIS — R293 Abnormal posture: Secondary | ICD-10-CM | POA: Diagnosis not present

## 2024-06-17 DIAGNOSIS — M6281 Muscle weakness (generalized): Secondary | ICD-10-CM | POA: Diagnosis not present

## 2024-06-17 DIAGNOSIS — M5459 Other low back pain: Secondary | ICD-10-CM | POA: Diagnosis not present

## 2024-06-17 NOTE — Therapy (Signed)
 OUTPATIENT PHYSICAL THERAPY THORACOLUMBAR TREATMENT    Patient Name: LYNIAH FUJITA MRN: [id number] DOB:19-May-1946, 78 y.o., female Today's Date: 06/17/2024  END OF SESSION:  PT End of Session - 06/17/24 1344     Visit Number 5    PT Start Time 1345    PT Stop Time 1230    PT Time Calculation (min) 1365 min    Activity Tolerance Patient tolerated treatment well    Behavior During Therapy [facility] for tasks assessed/performed           Past Medical History:  Diagnosis Date   Arthritis    Cancer (HCC)    Breast cancer right '06- surgery, chemo, radiation- released after 5 yrs   Dyspnea    in 2020 had pe placed on eliquis    Hx of seasonal allergies    OTC med used   Hypertension    Hypothyroidism    Pulmonary embolism (HCC)    hx of in 2020 placed on eliquis    Sleep apnea    cpap   Past Surgical History:  Procedure Laterality Date   BREAST SURGERY Right    modified partial mastectomy/ lymph nodes dissection   COLON SURGERY  2/17   INCISIONAL HERNIA REPAIR N/A 04/06/2016   Procedure: LAPAROSCOPIC ASSISTED INCISIONAL HERNIA REPAIR WITH MESH;  Surgeon: Bernarda Ned, MD;  Location: [facility];  Service: General;  Laterality: N/A;   INCISIONAL HERNIA REPAIR N/A 08/21/2022   Procedure: REPAIR OF INCISIONAL HERNIA;  Surgeon: Vanderbilt Ned, MD;  [facility]: [facility];  Service: General;  Laterality: N/A;  GEN/TAP BLOCK   INSERTION OF MESH N/A 08/21/2022   Procedure: INSERTION OF MESH;  Surgeon: Vanderbilt Ned, MD;  Location: [facility];  Service: General;  Laterality: N/A;   TONSILLECTOMY     TOTAL KNEE ARTHROPLASTY Left 12/19/2020   Procedure: TOTAL KNEE ARTHROPLASTY;  Surgeon: Melodi Lerner, MD;  Location: [facility];  Service: Orthopedics;  Laterality: Left;    TOTAL KNEE ARTHROPLASTY Right 11/06/2021   Procedure: TOTAL KNEE ARTHROPLASTY;  Surgeon: Melodi Lerner, MD;  Location: [facility];  Service: Orthopedics;  Laterality: Right;   Patient Active Problem List   Diagnosis Date Noted    Recurrent incisional hernia with obstruction 08/21/2022   OA (osteoarthritis) of knee 12/19/2020   Primary osteoarthritis of left knee 12/19/2020   Obstructive sleep apnea 06/19/2019   Malignant neoplasm of overlapping sites of right breast in female, estrogen receptor positive (HCC) 05/14/2019   Coagulopathy 05/14/2019   Morbid obesity (HCC) 05/14/2019   Chronic thromboembolic disease (HCC) 04/08/2019   Pulmonary vascular disease (HCC) 04/08/2019   Hypothyroidism 04/07/2019   Essential hypertension 04/07/2019   Pulmonary embolism (HCC) 04/06/2019   Abnormal liver function 04/06/2019   Hypokalemia 04/06/2019   Incisional hernia 04/06/2016    PCP: Teresa Channel MD   REFERRING PROVIDER: Burnetta Aures, MD  REFERRING DIAG: M54.50 (ICD-10-CM) - Low back pain, unspecified  Rationale for Evaluation and Treatment: Rehabilitation  THERAPY DIAG:  Muscle weakness (generalized)  Abnormal posture  Other low back pain  ONSET DATE: a couple of months ago   SUBJECTIVE:  SUBJECTIVE STATEMENT:  Im doing ok, things are going good, the exercises does help     EVAL: Every now and then I get intermittent sharp twinges that are almost debilitating. One thing that seems to cause this is being twisted to watch TV instead of being straight on, pain comes on when I try to stand up. Some days are worse than other days. Haven't noticed any other patterns that cause pain. No numbness or pain going into legs, pain tends to be more on the right side. I'm a slug, trying to walk but knees are stiff but that's because I don't exercise enough.   PERTINENT HISTORY:  See above   PAIN:  Are you having pain? Yes: NPRS scale: 0/10 Pain location: R side of low back  Pain description: burning sort of feeling   Aggravating factors: being twisted to watch TV, getting up  Relieving factors: changing position   PRECAUTIONS: None  RED FLAGS: None   WEIGHT BEARING RESTRICTIONS: No  FALLS:  Has patient fallen in last 6 months? 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    PATIENT SURVEYS:  PSFS: THE PATIENT SPECIFIC FUNCTIONAL SCALE  Place score of 0-10 (0 = unable to perform activity and 10 = able to perform activity at the same level as before injury or problem)  Activity Date: 04/22/24 06/03/24 06/17/24  Getting up from sitting position with pain is present  7 8 9   2. Getting around when pain starts/is present  7 8 9   3.     4.      Total Score 7 8 9     Total Score = Sum of activity scores/number of activities  Minimally Detectable Change: 3 points (for single activity); 2 points (for average score)  [facility] (nd). The Patient Specific Functional Scale . Retrieved from [email or web address]   COGNITION: Overall cognitive status: Within functional limits for tasks assessed     SENSATION: Not tested  MUSCLE LENGTH:  HS mild limitation B Piriformis mod limitation B   POSTURE: rounded shoulders and forward head    LUMBAR ROM:   AROM eval 06/03/24  Flexion 10% limited but stiff and slow  WFL  Extension WNL WFL  Right lateral flexion WNL  WFL  Left lateral flexion WNL WFL  Right rotation    Left rotation     (Blank rows = not tested)    LOWER EXTREMITY MMT:    MMT  Right eval Left eval Right 06/17/24 Left 06/17/24  Hip flexion 4 4 5 5   Hip extension      Hip abduction 3 3 5 5   Hip adduction      Hip internal rotation      Hip external rotation      Knee flexion 4+ 4+ 5 5  Knee extension 4+ 4+ 5 5  Ankle dorsiflexion      Ankle plantarflexion      Ankle inversion      Ankle eversion       (Blank rows = not tested)    TREATMENT DATE:  06/17/24 NuStep L5 x  6 min Goals Sit to stand holding blue ball 2x10 Rows and lats 25lb 2x10  Shoulder Ext 5lb 2x10 Seated OHP yellow ball 2x10  06/03/24 Bike L3 x4 min Gait outside around two side island on L side and front island Goals  Sit to stand holding blue ball 2x10 Rows and lats 25lb 2x10  Shoulder Ext 5lb 2x10 Seated OHP yellow ball 2x10 Seated trunk rotation w/ yellow ball 2x10 Shoulder ER green   05/20/24 Bike L2.5 x3 min NuStep L5 x 4 min Shoulder Ext 5lb 2x10 Rows 5lb 2x10  Sit to stand holding yellow ball 2x10 Horiz abd green 2x10 Bridges x10  LE on pball bridges, K2C, obl  Passive stretching to bilateral piriformis, HS, Glute, ITB  05/06/24  Scifit bike L1x6 minutes (nustep not available)  Lumbar rotation stretch 5x5 seconds B SKTC 5x5 seconds B Bridges + ABD into red TB x10 B Sidelying clams red TB x10  PPT 12x3 second holds  PPT + march x12  Hooklying HS stretch 2x30 seconds B  Standing hip ABD red TB x10 B Standing marches red TB x10 B Standing hip extensions red TB x10 B Hip hikes x12 B     04/22/24  Eval, POC, education and HEP practice as below   Nustep L5x8 minutes seat 7 all four extremities                                                                                                                                  PATIENT EDUCATION:  Education details: exam findings, POC, HEP  Person educated: Patient Education method: Programmer, multimedia, Demonstration, and Handouts Education comprehension: verbalized understanding, returned demonstration, and needs  further education  HOME EXERCISE PROGRAM: Access Code: URL: [email or web address] Date: 06/17/2024 Prepared by: Tanda Sorrow  Exercises - Supine Lower Trunk Rotation  - 2 x daily - 7 x weekly - 1 sets - 10 reps - 3 seconds  hold - Supine Single Knee to Chest Stretch  - 2 x daily - 7 x weekly - 1 sets - 10 reps - 3 seconds  hold - Supine Bridge with Resistance Band  - 1 x daily - 7 x weekly - 2 sets - 10 reps - 1 second  hold - Clamshell with Resistance  - 1 x daily - 7 x weekly - 2 sets - 10 reps - 1 second  hold - Supine Transversus Abdominis Bracing - Hands on Stomach  - 1 x daily - 7 x weekly - 2 sets - 10 reps - 3 seconds  hold - Standing Row with Resistance  - 1 x daily - 7 x weekly - 3 sets - 10 reps - Standing Shoulder Extension with Resistance  - 1 x daily - 7 x weekly - 3 sets - 10 reps - Sit to Stand Without Arm Support  - 1 x daily - 7 x weekly - 3 sets - 10 reps  ASSESSMENT:  CLINICAL  IMPRESSION:  Arrives doing good with no pain  She ha progressed meeting all goals. Updated HEP for added postural strength. All interventions completed well. Pt repots no functional limitations.     EVAL: Patient is a 78 y.o. F who was seen today for physical therapy evaluation and treatment for M54.50 (ICD-10-CM) - Low back pain, unspecified. Objectives as above, anticipate she will respond well to a targeted course of lumbar/core and hip strengthening moving forward, will also benefit from postural retraining program. She is requesting focus on HEP that she can work on on her own with occasional check-ins with formal PT to make sure she is progressing as expected.   OBJECTIVE IMPAIRMENTS: decreased mobility, difficulty walking, decreased ROM, decreased strength, increased fascial restrictions, increased muscle spasms, impaired flexibility, improper body mechanics, postural dysfunction, obesity, and pain.   ACTIVITY LIMITATIONS: carrying, lifting, transfers,  toileting, dressing, hygiene/grooming, and locomotion level  PARTICIPATION LIMITATIONS: meal prep, cleaning, laundry, community activity, and occupation  PERSONAL FACTORS: Age, Behavior pattern, Education, Fitness, and Time since onset of injury/illness/exacerbation are also affecting patient's functional outcome.   REHAB POTENTIAL: Good  CLINICAL DECISION MAKING: Stable/uncomplicated  EVALUATION COMPLEXITY: Low   GOALS: Goals reviewed with patient? No  SHORT TERM GOALS: Target date: 05/06/2024    Will be compliant with appropriate progressive HEP  Baseline: Goal status: Met 06/03/24      LONG TERM GOALS: Target date: 07/15/2024    MMT to have improved by one grade all weak groups  Baseline:  Goal status: 06/17/24 Met  2.  Lumbar AROM and hip flexibility to have normalized  Baseline:  Goal status: Partly met 06/03/24, Met 06/17/24  3.  Pain frequency and intensity to have improved by at least 75%  Baseline:  Goal status: Met 06/17/24  4.  PSFS to have improved by at least 2 points  Baseline:  Goal status: Progressing 06/03/24, Met 06/17/24    PLAN:  PT FREQUENCY: every other week  PT DURATION: 12 weeks  PLANNED INTERVENTIONS: 97750- Physical Performance Testing, 97110-Therapeutic exercises, 97530- Therapeutic activity, 97112- Neuromuscular re-education, 97535- Self Care, 02859- Manual therapy, and [id number]- Aquatic Therapy.  PLAN FOR NEXT SESSION: focus on lumbar/hip mobility, proximal and core strength. HEP updates as appropriate, only coming once every 2 weeks but still need to encourage compliance with this   PHYSICAL THERAPY DISCHARGE SUMMARY  Visits from Start of Care: 5   Patient agrees to discharge. Patient goals were met. Patient is being discharged due to meeting the stated rehab goals.  Almetta Fam, PT, DPT Tanda Sorrow, PTA 06/17/24 1:44 PM

## 2024-06-24 DIAGNOSIS — E669 Obesity, unspecified: Secondary | ICD-10-CM | POA: Diagnosis not present

## 2024-06-24 DIAGNOSIS — Z6836 Body mass index (BMI) 36.0-36.9, adult: Secondary | ICD-10-CM | POA: Diagnosis not present

## 2024-06-29 DIAGNOSIS — M85831 Other specified disorders of bone density and structure, right forearm: Secondary | ICD-10-CM | POA: Diagnosis not present

## 2024-06-29 DIAGNOSIS — Z1231 Encounter for screening mammogram for malignant neoplasm of breast: Secondary | ICD-10-CM | POA: Diagnosis not present

## 2024-06-29 DIAGNOSIS — M85851 Other specified disorders of bone density and structure, right thigh: Secondary | ICD-10-CM | POA: Diagnosis not present

## 2024-07-02 DIAGNOSIS — M810 Age-related osteoporosis without current pathological fracture: Secondary | ICD-10-CM | POA: Diagnosis not present

## 2024-07-02 DIAGNOSIS — E785 Hyperlipidemia, unspecified: Secondary | ICD-10-CM | POA: Diagnosis not present

## 2024-07-02 DIAGNOSIS — G4733 Obstructive sleep apnea (adult) (pediatric): Secondary | ICD-10-CM | POA: Diagnosis not present

## 2024-07-02 DIAGNOSIS — I10 Essential (primary) hypertension: Secondary | ICD-10-CM | POA: Diagnosis not present

## 2024-07-02 DIAGNOSIS — Z7901 Long term (current) use of anticoagulants: Secondary | ICD-10-CM | POA: Diagnosis not present

## 2024-07-02 DIAGNOSIS — G4700 Insomnia, unspecified: Secondary | ICD-10-CM | POA: Diagnosis not present

## 2024-07-02 DIAGNOSIS — Z0000 Encounter for general adult medical examination without abnormal findings: Secondary | ICD-10-CM | POA: Diagnosis not present

## 2024-07-02 DIAGNOSIS — I890 Lymphedema, not elsewhere classified: Secondary | ICD-10-CM | POA: Diagnosis not present

## 2024-07-02 DIAGNOSIS — G4734 Idiopathic sleep related nonobstructive alveolar hypoventilation: Secondary | ICD-10-CM | POA: Diagnosis not present

## 2024-07-13 DIAGNOSIS — N6489 Other specified disorders of breast: Secondary | ICD-10-CM | POA: Diagnosis not present

## 2024-07-15 ENCOUNTER — Other Ambulatory Visit: Payer: Self-pay

## 2024-07-15 DIAGNOSIS — N6325 Unspecified lump in the left breast, overlapping quadrants: Secondary | ICD-10-CM | POA: Diagnosis not present

## 2024-07-15 DIAGNOSIS — R590 Localized enlarged lymph nodes: Secondary | ICD-10-CM | POA: Diagnosis not present

## 2024-07-16 LAB — SURGICAL PATHOLOGY

## 2024-07-24 DIAGNOSIS — E669 Obesity, unspecified: Secondary | ICD-10-CM | POA: Diagnosis not present

## 2024-07-24 DIAGNOSIS — M810 Age-related osteoporosis without current pathological fracture: Secondary | ICD-10-CM | POA: Diagnosis not present

## 2024-07-24 DIAGNOSIS — Z6836 Body mass index (BMI) 36.0-36.9, adult: Secondary | ICD-10-CM | POA: Diagnosis not present

## 2024-07-24 DIAGNOSIS — I10 Essential (primary) hypertension: Secondary | ICD-10-CM | POA: Diagnosis not present

## 2024-07-24 DIAGNOSIS — E785 Hyperlipidemia, unspecified: Secondary | ICD-10-CM | POA: Diagnosis not present

## 2024-08-23 DIAGNOSIS — M810 Age-related osteoporosis without current pathological fracture: Secondary | ICD-10-CM | POA: Diagnosis not present

## 2024-08-23 DIAGNOSIS — E785 Hyperlipidemia, unspecified: Secondary | ICD-10-CM | POA: Diagnosis not present

## 2024-08-23 DIAGNOSIS — I10 Essential (primary) hypertension: Secondary | ICD-10-CM | POA: Diagnosis not present

## 2024-08-26 DIAGNOSIS — G4700 Insomnia, unspecified: Secondary | ICD-10-CM | POA: Diagnosis not present

## 2024-08-26 DIAGNOSIS — Z7901 Long term (current) use of anticoagulants: Secondary | ICD-10-CM | POA: Diagnosis not present

## 2024-08-26 DIAGNOSIS — I10 Essential (primary) hypertension: Secondary | ICD-10-CM | POA: Diagnosis not present

## 2024-08-26 DIAGNOSIS — G4733 Obstructive sleep apnea (adult) (pediatric): Secondary | ICD-10-CM | POA: Diagnosis not present

## 2024-08-26 DIAGNOSIS — Z86711 Personal history of pulmonary embolism: Secondary | ICD-10-CM | POA: Diagnosis not present

## 2024-08-26 DIAGNOSIS — G4734 Idiopathic sleep related nonobstructive alveolar hypoventilation: Secondary | ICD-10-CM | POA: Diagnosis not present

## 2024-08-26 DIAGNOSIS — E039 Hypothyroidism, unspecified: Secondary | ICD-10-CM | POA: Diagnosis not present
